# Patient Record
Sex: Male | Born: 1960 | Race: Black or African American | Hispanic: No
Health system: Southern US, Community
[De-identification: ages and names within clinical notes are randomized; demographics above are authoritative.]

## PROBLEM LIST (undated history)

## (undated) DIAGNOSIS — I639 Cerebral infarction, unspecified: Secondary | ICD-10-CM

## (undated) DIAGNOSIS — E785 Hyperlipidemia, unspecified: Secondary | ICD-10-CM

## (undated) DIAGNOSIS — E1149 Type 2 diabetes mellitus with other diabetic neurological complication: Secondary | ICD-10-CM

## (undated) DIAGNOSIS — I10 Essential (primary) hypertension: Secondary | ICD-10-CM

## (undated) HISTORY — PX: COLONOSCOPY: SHX174

---

## 2019-12-20 ENCOUNTER — Other Ambulatory Visit: Payer: Self-pay | Admitting: Critical Care Medicine

## 2019-12-20 DIAGNOSIS — Z20822 Contact with and (suspected) exposure to covid-19: Secondary | ICD-10-CM

## 2019-12-21 LAB — NOVEL CORONAVIRUS, NAA: SARS-CoV-2, NAA: NOT DETECTED

## 2020-01-10 ENCOUNTER — Other Ambulatory Visit: Payer: Self-pay

## 2020-01-10 DIAGNOSIS — Z20822 Contact with and (suspected) exposure to covid-19: Secondary | ICD-10-CM

## 2020-01-12 LAB — NOVEL CORONAVIRUS, NAA: SARS-CoV-2, NAA: NOT DETECTED

## 2020-01-31 ENCOUNTER — Other Ambulatory Visit: Payer: Self-pay

## 2020-01-31 DIAGNOSIS — Z20822 Contact with and (suspected) exposure to covid-19: Secondary | ICD-10-CM

## 2020-02-01 LAB — NOVEL CORONAVIRUS, NAA: SARS-CoV-2, NAA: NOT DETECTED

## 2020-03-12 ENCOUNTER — Ambulatory Visit: Payer: Medicaid Other | Attending: Internal Medicine

## 2020-03-12 DIAGNOSIS — Z23 Encounter for immunization: Secondary | ICD-10-CM

## 2020-03-12 NOTE — Progress Notes (Signed)
   Covid-[facility] Vaccination [facility]  Name:  Calvin Jackson    MRN: [id number] DOB: 02-15-1961  03/12/2020  [name] was observed post Covid-19 immunization for 15 minutes without incident. He was provided with Vaccine Information Sheet and instruction to access the V-Safe system.   [name] was instructed to call 911 with any severe reactions post vaccine: Marland Kitchen Difficulty breathing  . Swelling of face and throat  . A fast heartbeat  . A bad rash all over body  . Dizziness and weakness   Immunizations Administered    Name Date Dose VIS Date Route   Pfizer COVID-19 Vaccine 03/12/2020 10:33 AM 0.3 mL 11/15/2019 Intramuscular   Manufacturer: [employer], [employer]   Lot: [id number]   NDC: [address]-1

## 2020-04-03 ENCOUNTER — Ambulatory Visit: Payer: Medicaid Other | Attending: Internal Medicine

## 2020-04-03 DIAGNOSIS — Z23 Encounter for immunization: Secondary | ICD-10-CM

## 2020-04-03 NOTE — Progress Notes (Signed)
   Covid-19 Vaccination Clinic  Name:  Phillp Dolores    MRN: [id number] DOB: 09-03-1961  04/03/2020  Mr. Bogan was observed post Covid-19 immunization for 15 minutes without incident. He was provided with Vaccine Information Sheet and instruction to access the V-Safe system.   Mr. Doren was instructed to call 911 with any severe reactions post vaccine: Marland Kitchen Difficulty breathing  . Swelling of face and throat  . A fast heartbeat  . A bad rash all over body  . Dizziness and weakness   Immunizations Administered    Name Date Dose VIS Date Route   Pfizer COVID-19 Vaccine 04/03/2020  1:05 PM 0.3 mL 01/29/2019 Intramuscular   Manufacturer: [employer], [employer]   Lot: [id number]   NDC: [address]-8

## 2020-04-06 ENCOUNTER — Ambulatory Visit: Payer: Medicaid Other

## 2020-10-15 ENCOUNTER — Emergency Department (HOSPITAL_COMMUNITY): Payer: Self-pay

## 2020-10-15 ENCOUNTER — Inpatient Hospital Stay (HOSPITAL_COMMUNITY)
Admission: EM | Admit: 2020-10-15 | Discharge: 2020-10-17 | DRG: 065 | Disposition: A | Discharge status: Home / Self Care | Payer: Self-pay | Attending: Internal Medicine | Admitting: Internal Medicine

## 2020-10-15 ENCOUNTER — Encounter (HOSPITAL_COMMUNITY): Payer: Self-pay

## 2020-10-15 ENCOUNTER — Inpatient Hospital Stay (HOSPITAL_COMMUNITY): Payer: Self-pay

## 2020-10-15 DIAGNOSIS — F1220 Cannabis dependence, uncomplicated: Secondary | ICD-10-CM | POA: Diagnosis present

## 2020-10-15 DIAGNOSIS — Z23 Encounter for immunization: Secondary | ICD-10-CM

## 2020-10-15 DIAGNOSIS — I69354 Hemiplegia and hemiparesis following cerebral infarction affecting left non-dominant side: Secondary | ICD-10-CM

## 2020-10-15 DIAGNOSIS — F17200 Nicotine dependence, unspecified, uncomplicated: Secondary | ICD-10-CM | POA: Diagnosis present

## 2020-10-15 DIAGNOSIS — N289 Disorder of kidney and ureter, unspecified: Secondary | ICD-10-CM | POA: Diagnosis present

## 2020-10-15 DIAGNOSIS — G459 Transient cerebral ischemic attack, unspecified: Secondary | ICD-10-CM

## 2020-10-15 DIAGNOSIS — E785 Hyperlipidemia, unspecified: Secondary | ICD-10-CM | POA: Diagnosis present

## 2020-10-15 DIAGNOSIS — Z20822 Contact with and (suspected) exposure to covid-19: Secondary | ICD-10-CM | POA: Diagnosis present

## 2020-10-15 DIAGNOSIS — F1210 Cannabis abuse, uncomplicated: Secondary | ICD-10-CM | POA: Diagnosis present

## 2020-10-15 DIAGNOSIS — E876 Hypokalemia: Secondary | ICD-10-CM | POA: Diagnosis present

## 2020-10-15 DIAGNOSIS — I6389 Other cerebral infarction: Secondary | ICD-10-CM

## 2020-10-15 DIAGNOSIS — I639 Cerebral infarction, unspecified: Principal | ICD-10-CM | POA: Diagnosis present

## 2020-10-15 DIAGNOSIS — F17210 Nicotine dependence, cigarettes, uncomplicated: Secondary | ICD-10-CM | POA: Diagnosis present

## 2020-10-15 DIAGNOSIS — I10 Essential (primary) hypertension: Secondary | ICD-10-CM | POA: Diagnosis present

## 2020-10-15 DIAGNOSIS — R29700 NIHSS score 0: Secondary | ICD-10-CM | POA: Diagnosis present

## 2020-10-15 DIAGNOSIS — E1149 Type 2 diabetes mellitus with other diabetic neurological complication: Secondary | ICD-10-CM | POA: Diagnosis present

## 2020-10-15 HISTORY — DX: Essential (primary) hypertension: I10

## 2020-10-15 HISTORY — DX: Cannabis abuse, uncomplicated: F12.10

## 2020-10-15 HISTORY — DX: Cerebral infarction, unspecified: I63.9

## 2020-10-15 HISTORY — DX: Nicotine dependence, unspecified, uncomplicated: F17.200

## 2020-10-15 HISTORY — DX: Type 2 diabetes mellitus with other diabetic neurological complication: E11.49

## 2020-10-15 HISTORY — DX: Hyperlipidemia, unspecified: E78.5

## 2020-10-15 LAB — CBC
HCT: 42.6 % (ref 39.0–52.0)
Hemoglobin: 14.8 g/dL (ref 13.0–17.0)
MCH: 27.3 pg (ref 26.0–34.0)
MCHC: 34.7 g/dL (ref 30.0–36.0)
MCV: 78.6 fL — ABNORMAL LOW (ref 80.0–100.0)
Platelets: 210 10*3/uL (ref 150–400)
RBC: 5.42 MIL/uL (ref 4.22–5.81)
RDW: 13.7 % (ref 11.5–15.5)
WBC: 4.6 10*3/uL (ref 4.0–10.5)
nRBC: 0 % (ref 0.0–0.2)

## 2020-10-15 LAB — COMPREHENSIVE METABOLIC PANEL
ALT: 21 U/L (ref 0–44)
AST: 21 U/L (ref 15–41)
Albumin: 3.4 g/dL — ABNORMAL LOW (ref 3.5–5.0)
Alkaline Phosphatase: 54 U/L (ref 38–126)
Anion gap: 8 (ref 5–15)
BUN: 12 mg/dL (ref 8–23)
CO2: 26 mmol/L (ref 22–32)
Calcium: 8.6 mg/dL — ABNORMAL LOW (ref 8.9–10.3)
Chloride: 109 mmol/L (ref 98–111)
Creatinine, Ser: 1.43 mg/dL — ABNORMAL HIGH (ref 0.61–1.24)
GFR, Estimated: 56 mL/min — ABNORMAL LOW (ref >60)
Glucose, Bld: 121 mg/dL — ABNORMAL HIGH (ref 70–99)
Potassium: 3.3 mmol/L — ABNORMAL LOW (ref 3.5–5.1)
Sodium: 143 mmol/L (ref 135–145)
Total Bilirubin: 1 mg/dL (ref 0.3–1.2)
Total Protein: 6.6 g/dL (ref 6.5–8.1)

## 2020-10-15 LAB — HEMOGLOBIN A1C
Hgb A1c MFr Bld: 5.9 % — ABNORMAL HIGH (ref 4.8–5.6)
Mean Plasma Glucose: 122.63 mg/dL

## 2020-10-15 LAB — I-STAT CHEM 8, ED
BUN: 14 mg/dL (ref 8–23)
Calcium, Ion: 1.13 mmol/L — ABNORMAL LOW (ref 1.15–1.40)
Chloride: 105 mmol/L (ref 98–111)
Creatinine, Ser: 1.3 mg/dL — ABNORMAL HIGH (ref 0.61–1.24)
Glucose, Bld: 115 mg/dL — ABNORMAL HIGH (ref 70–99)
HCT: 46 % (ref 39.0–52.0)
Hemoglobin: 15.6 g/dL (ref 13.0–17.0)
Potassium: 3.2 mmol/L — ABNORMAL LOW (ref 3.5–5.1)
Sodium: 144 mmol/L (ref 135–145)
TCO2: 25 mmol/L (ref 22–32)

## 2020-10-15 LAB — ECHOCARDIOGRAM COMPLETE
Area-P 1/2: 3.42 cm2
Calc EF: 56.1 %
S' Lateral: 3.3 cm
Single Plane A2C EF: 58.8 %
Single Plane A4C EF: 53.8 %

## 2020-10-15 LAB — APTT: aPTT: 27 seconds (ref 24–36)

## 2020-10-15 LAB — DIFFERENTIAL
Abs Immature Granulocytes: 0.01 10*3/uL (ref 0.00–0.07)
Basophils Absolute: 0 10*3/uL (ref 0.0–0.1)
Basophils Relative: 1 %
Eosinophils Absolute: 0.3 10*3/uL (ref 0.0–0.5)
Eosinophils Relative: 7 %
Immature Granulocytes: 0 %
Lymphocytes Relative: 33 %
Lymphs Abs: 1.5 10*3/uL (ref 0.7–4.0)
Monocytes Absolute: 0.6 10*3/uL (ref 0.1–1.0)
Monocytes Relative: 13 %
Neutro Abs: 2.2 10*3/uL (ref 1.7–7.7)
Neutrophils Relative %: 46 %

## 2020-10-15 LAB — RAPID URINE DRUG SCREEN, HOSP PERFORMED
Amphetamines: NOT DETECTED
Barbiturates: NOT DETECTED
Benzodiazepines: NOT DETECTED
Cocaine: NOT DETECTED
Opiates: NOT DETECTED
Tetrahydrocannabinol: POSITIVE — AB

## 2020-10-15 LAB — GLUCOSE, CAPILLARY
Glucose-Capillary: 163 mg/dL — ABNORMAL HIGH (ref 70–99)
Glucose-Capillary: 119 mg/dL — ABNORMAL HIGH (ref 70–99)

## 2020-10-15 LAB — CBG MONITORING, ED
Glucose-Capillary: 112 mg/dL — ABNORMAL HIGH (ref 70–99)
Glucose-Capillary: 111 mg/dL — ABNORMAL HIGH (ref 70–99)
Glucose-Capillary: 109 mg/dL — ABNORMAL HIGH (ref 70–99)

## 2020-10-15 LAB — PROTIME-INR
INR: 1.1 (ref 0.8–1.2)
Prothrombin Time: 13.9 seconds (ref 11.4–15.2)

## 2020-10-15 LAB — RESPIRATORY PANEL BY RT PCR (FLU A&B, COVID)
Influenza A by PCR: NEGATIVE
Influenza B by PCR: NEGATIVE
SARS Coronavirus 2 by RT PCR: NEGATIVE

## 2020-10-15 LAB — HIV ANTIBODY (ROUTINE TESTING W REFLEX): HIV Screen 4th Generation wRfx: NONREACTIVE

## 2020-10-15 LAB — TSH: TSH: 0.992 u[IU]/mL (ref 0.350–4.500)

## 2020-10-15 IMAGING — MR MR HEAD W/O CM
12 of 13 series · 44 of 48 positions shown · non-contrast
Comparison: Noncontrast head CT and CT angiogram head/neck
[DATE].

CLINICAL DATA: Neuro deficit, acute, stroke suspected.

EXAM:
MRI HEAD WITHOUT CONTRAST
TECHNIQUE: Multiplanar, multiecho pulse sequences of the brain and surrounding
structures were obtained without intravenous contrast.

[Series 5: DWI · axial · 3.0mm · 0.88mm/px · z∈[-83,+64]mm · 7 of 100 slices shown (1 of 4)]
[im 1/100]
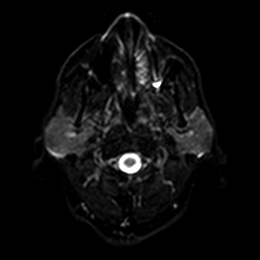
[im 17/100]
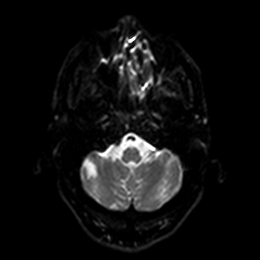
[im 34/100]
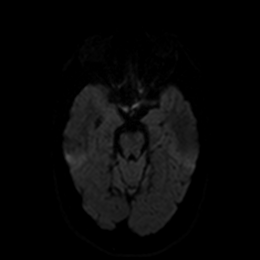
[im 50/100]
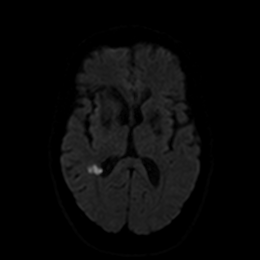
[im 67/100]
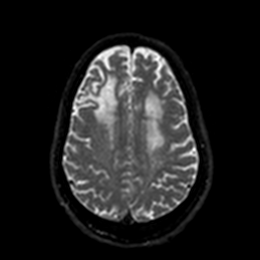
[im 83/100]
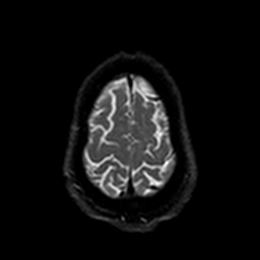
[im 100/100]
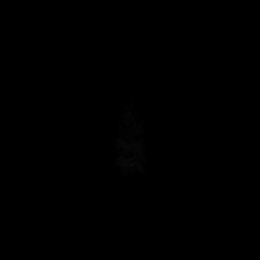

[Series 6: DWI · axial · 3.0mm · 0.88mm/px · z∈[-83,+64]mm · 4 of 50 slices shown (2 of 4)]
[im 1/50]
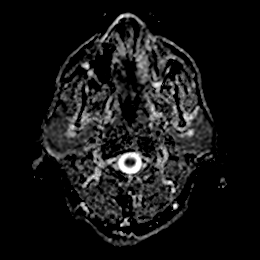
[im 17/50]
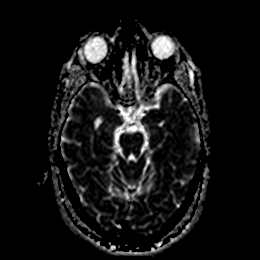
[im 33/50]
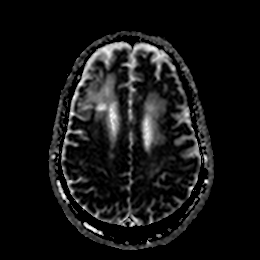
[im 50/50]
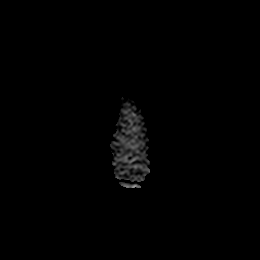

[Series 7: DWI · coronal · 4.0mm · 0.88mm/px · 6 of 78 slices shown (3 of 4)]
[im 1/78]
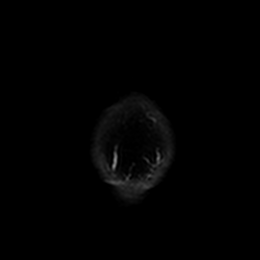
[im 16/78]
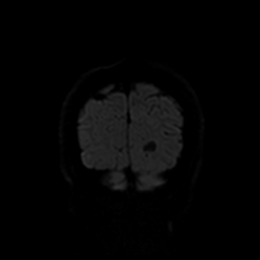
[im 31/78]
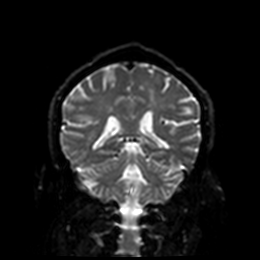
[im 47/78]
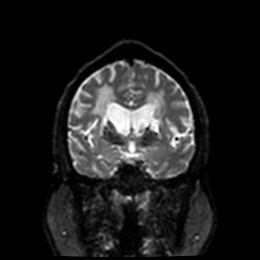
[im 62/78]
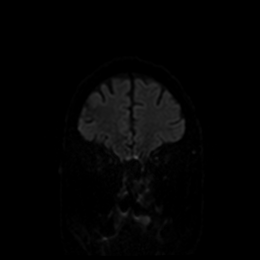
[im 78/78]
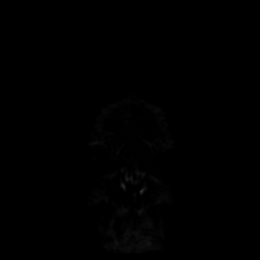

[Series 8: DWI · coronal · 4.0mm · 0.88mm/px · 3 of 39 slices shown (4 of 4)]
[im 1/39]
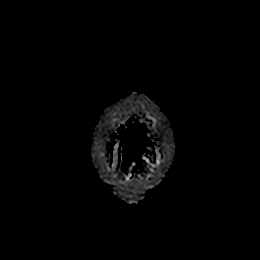
[im 20/39]
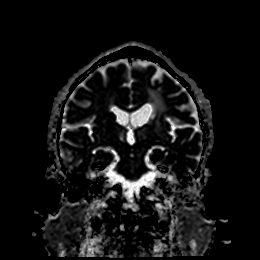
[im 39/39]
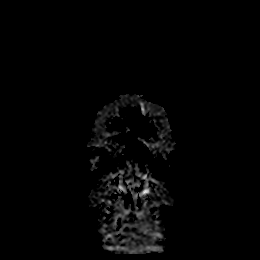

[Series 9: T1 · sagittal · 5.0mm · 0.75mm/px · 2 of 23 slices shown]
[im 1/23]
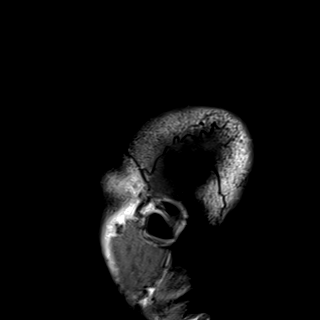
[im 23/23]
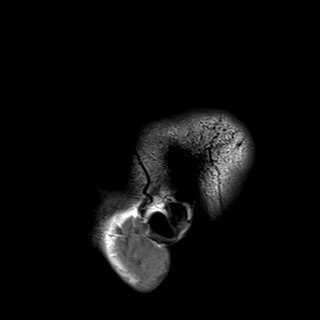

[Series 10: T2 · axial · 5.0mm · 0.72mm/px · z∈[-81,+62]mm · 2 of 25 slices shown (1 of 2)]
[im 1/25]
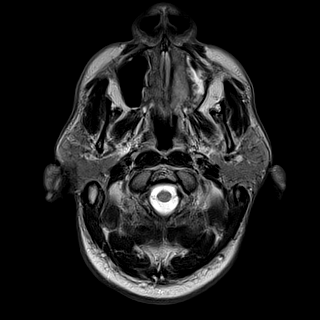
[im 25/25]
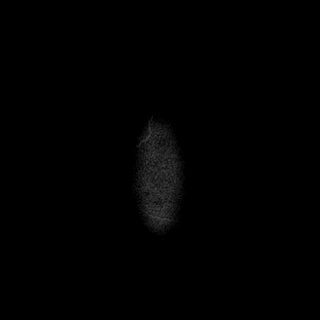

[Series 11: FLAIR · axial · 5.0mm · 0.45mm/px · z∈[-79,+64]mm · 2 of 25 slices shown]
[im 1/25]
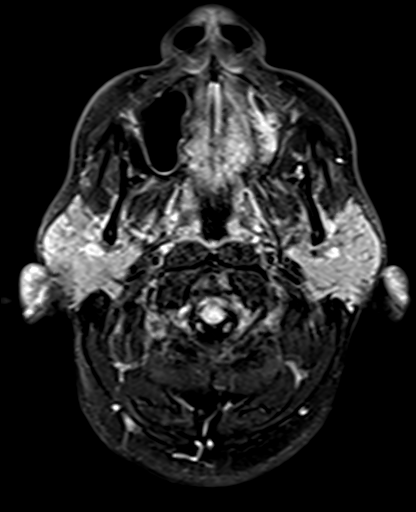
[im 25/25]
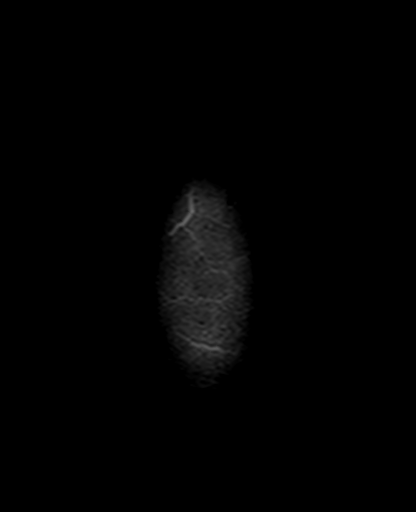

[Series 12: mag_images · axial · 3.0mm · 0.90mm/px · z∈[-96,+80]mm · 4 of 60 slices shown]
[im 1/60]
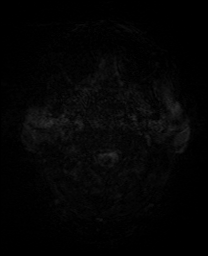
[im 20/60]
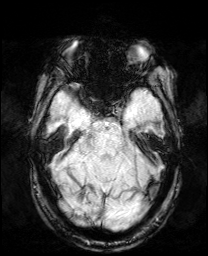
[im 40/60]
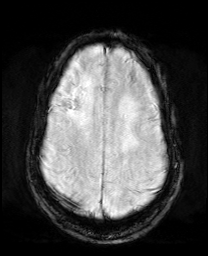
[im 60/60]
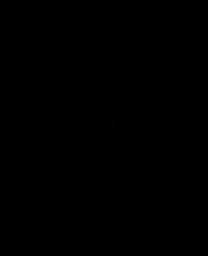

[Series 13: pha_images · axial · 3.0mm · 0.90mm/px · z∈[-96,+77]mm · 4 of 57 slices shown]
[im 1/57]
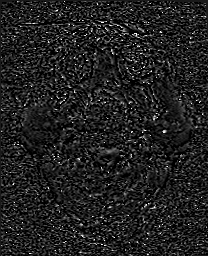
[im 19/57]
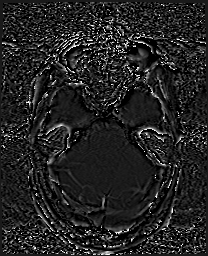
[im 38/57]
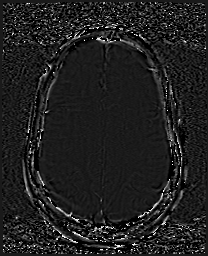
[im 57/57]
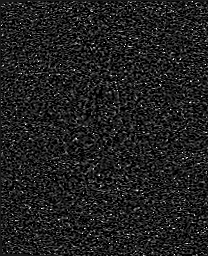

[Series 14: swi_images · axial · 3.0mm · 0.90mm/px · z∈[-96,+80]mm · 4 of 60 slices shown]
[im 1/60]
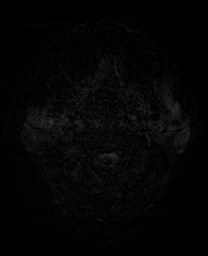
[im 20/60]
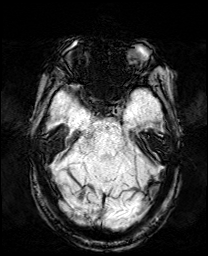
[im 40/60]
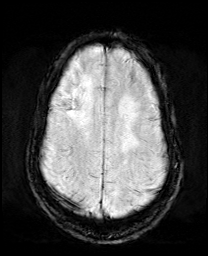
[im 60/60]
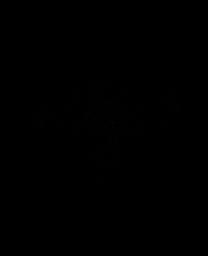

[Series 15: mip_images(sw) · axial · 24.0mm · 0.90mm/px · z∈[-85,+70]mm · 4 of 53 slices shown]
[im 1/53]
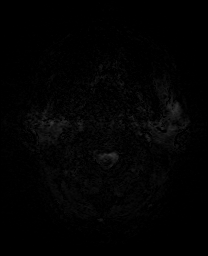
[im 18/53]
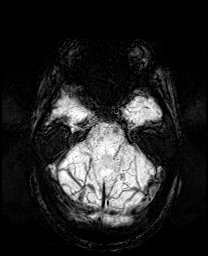
[im 35/53]
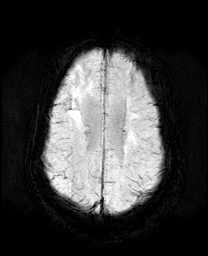
[im 53/53]
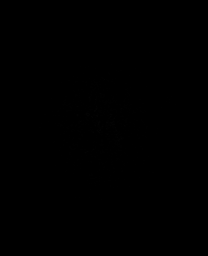

[Series 17: T2 · coronal · 5.0mm · 0.34mm/px · 2 of 32 slices shown (2 of 2)]
[im 1/32]
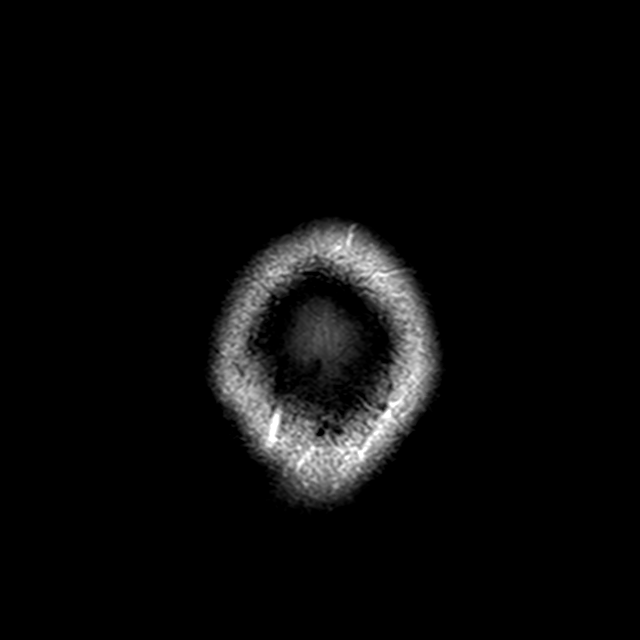
[im 32/32]
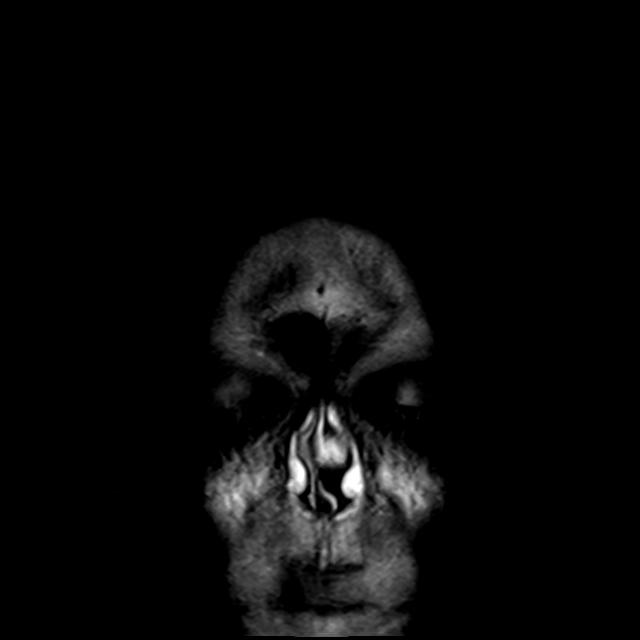

[44 of 48 positions shown; findings below may reference images not displayed]

FINDINGS: Brain:

Intermittent motion degradation. Most notably, there is moderate
motion degradation of the coronal T2 weighted sequence.

0.7 x 1.3 x 0.5 cm focus of restricted diffusion within the right
temporoparietal periventricular white matter consistent with acute
infarction (series 5, image 76).

2.1 x 0.8 x 0.9 cm focus of restricted diffusion within the right
corona radiata, possibly also involving portions of the adjacent
caudate nucleus, compatible with acute infarction (series 5, image
80).

A few small subtle acute cortical infarcts are also suspected within
the right parietal lobe (for instance as seen on series 5, image 79)
(series 7, image 45).

Chronic cortical/subcortical infarct within the mid to anterior and
lateral right frontal lobe/right frontal operculum. This infarct
extends to the right insula anteriorly

Chronic subcortical infarcts are also present within the bilateral
cerebral white matter, basal ganglia and thalami. Small chronic
infarcts are present within the bilateral cerebellar hemispheres.

Severe scattered and confluent T2/FLAIR hyperintensity within the
cerebral white matter is nonspecific, but compatible chronic small
vessel ischemic disease. To a lesser degree, chronic small vessel
ischemic changes are also present within the pons.

Mild SWI signal loss within the bilateral basal ganglia which may
reflect chronic blood products or mineralization.

No evidence of intracranial mass.

No extra-axial fluid collection.

No midline shift.

Vascular: Expected proximal arterial flow voids.

Skull and upper cervical spine: No focal marrow lesion. Cervical
spondylosis.

Sinuses/Orbits: Visualized orbits show no acute finding. Mild
ethmoid and left maxillary sinus mucosal thickening. Small right
maxillary sinus mucous retention cyst. Polypoid soft tissue within
the anterior left nasal passage.
IMPRESSION: Motion degraded examination.

1.3 cm acute infarct within the right temporoparietal
periventricular white matter.

2.1 cm acute infarct within the right corona radiata, possibly also
involving portions of the adjacent right caudate nucleus.

A few small subtle acute cortical infarcts are also suspected within
the right parietal lobe.

Severe chronic small vessel ischemic disease with remote
supratentorial cortical and subcortical infarcts, and remote
bilateral cerebellar infarcts.

Paranasal sinus disease as described.

## 2020-10-15 IMAGING — CT CT ANGIO HEAD-NECK
3 of 7 series · 10 of 36 positions shown · IV contrast (OMNI 350)
Comparison: Head CT from earlier the same day

CLINICAL DATA: Left leg drift.  History of stroke



[Series 7: cta neck · axial · 0.48mm/px · z∈[-242,-128]mm · 2 of 173 slices shown]
[im 58/173  soft-tissue]
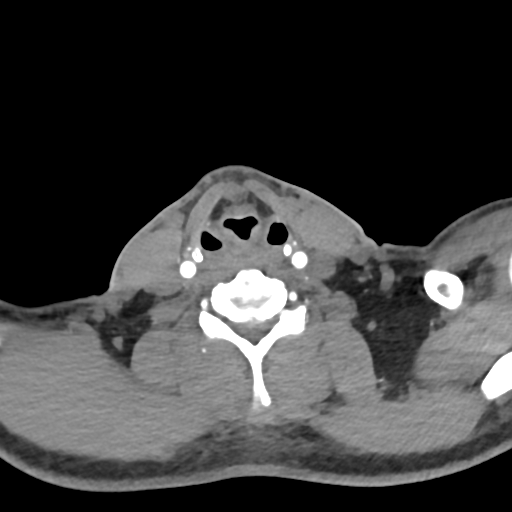
[im 115/173  bone]
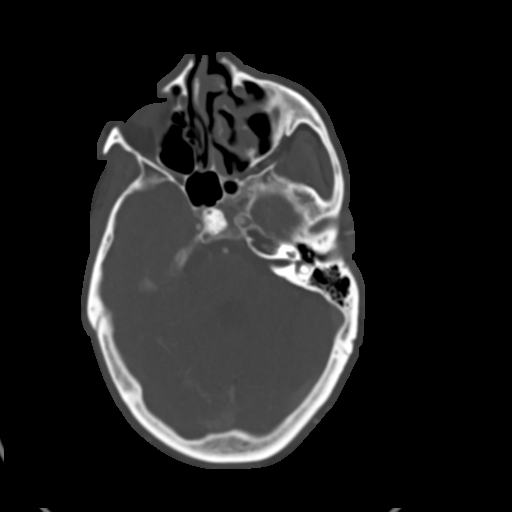

[Series 9: cta neck axial · axial · 0.39mm/px · z∈[-317,-73]mm · 6 of 343 slices shown]
[im 49/343  soft-tissue]
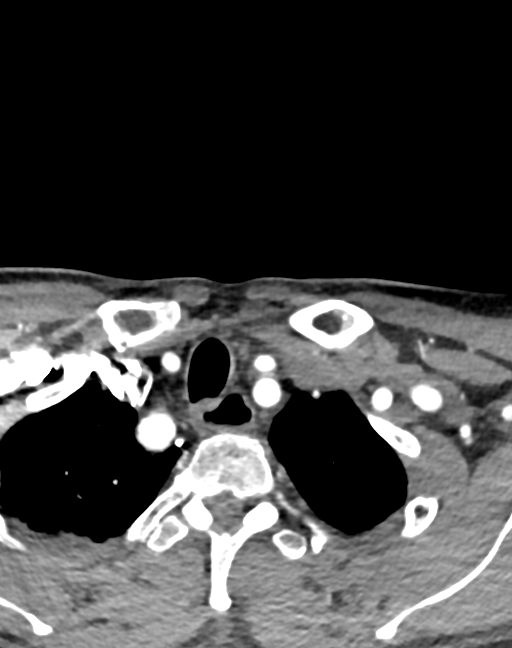
[im 98/343  soft-tissue]
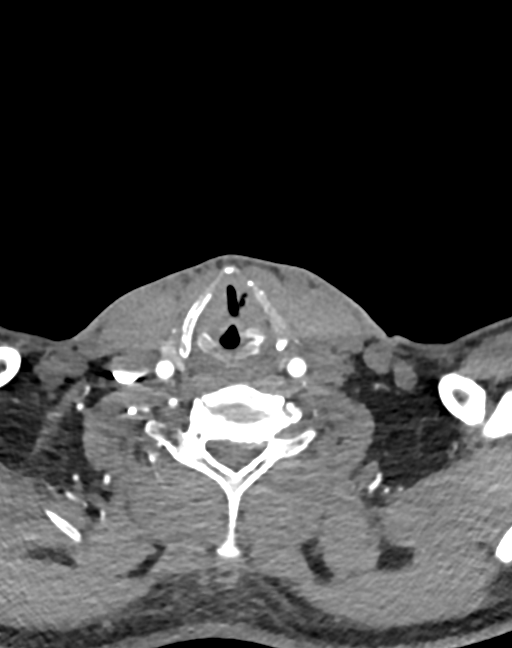
[im 147/343  soft-tissue]
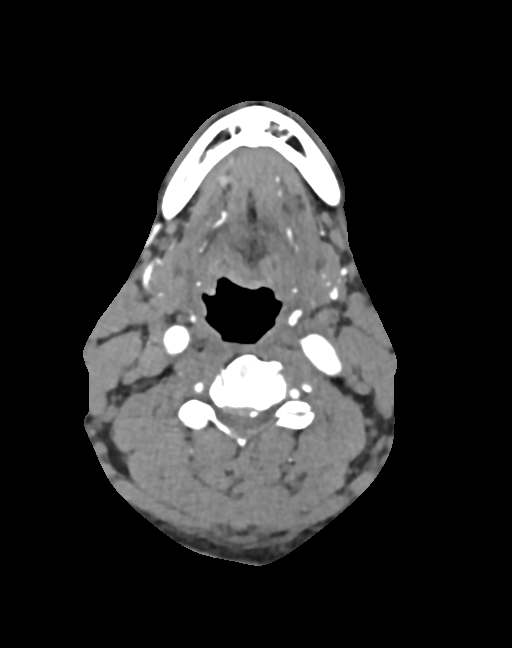
[im 196/343  soft-tissue]
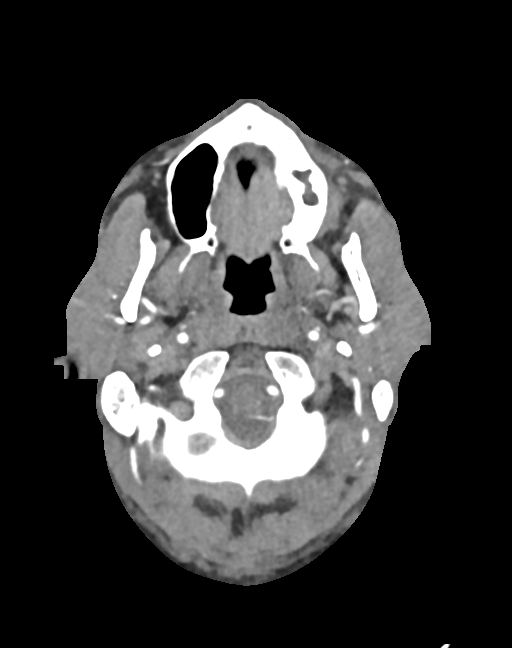
[im 245/343  soft-tissue]
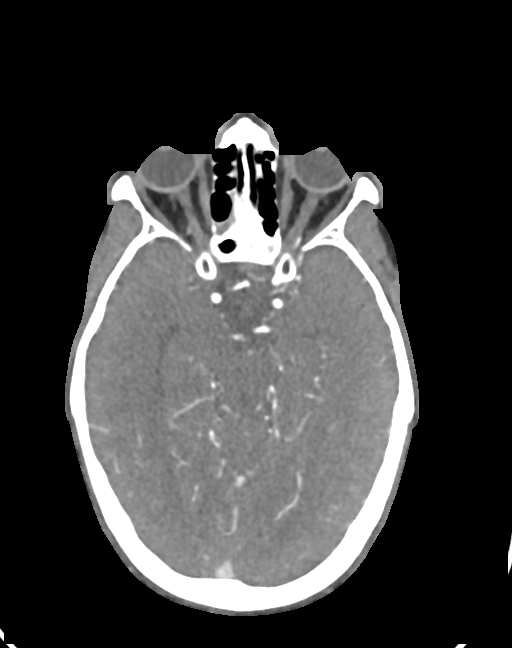
[im 294/343  soft-tissue]
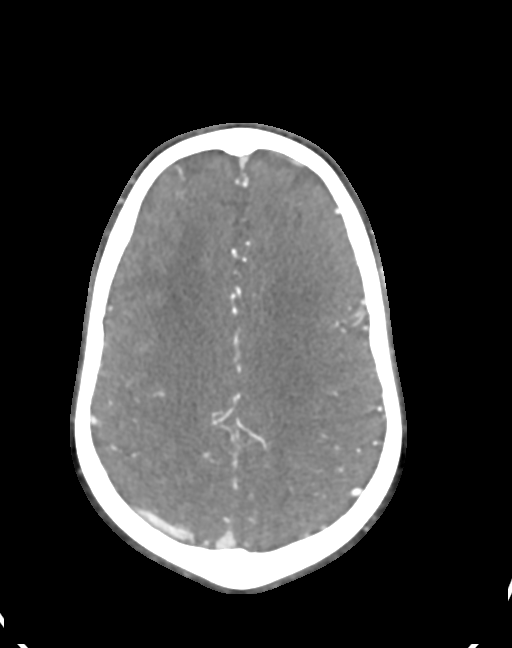

[Series 11: cta neck sagittal · sagittal · 0.49mm/px · 2 of 175 slices shown]
[im 33/175  soft-tissue]
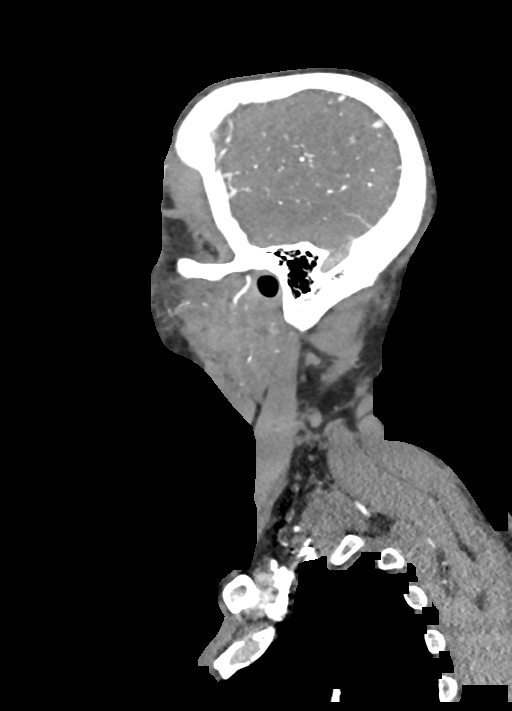
[im 143/175  soft-tissue]
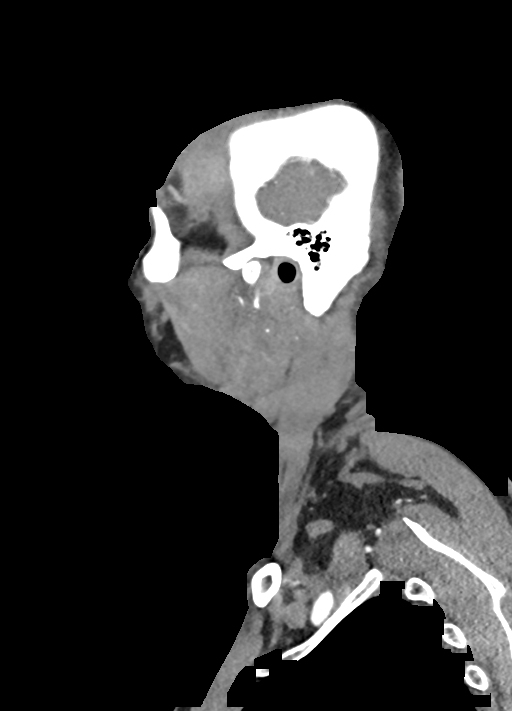

[10 of 36 positions shown; findings below may reference images not displayed]

FINDINGS: CTA NECK FINDINGS

Aortic arch: 3 vessel branching.  No acute finding or dilatation.

Right carotid system: Vessels are smooth and widely patent with no
noted atheromatous changes.

Left carotid system: Vessels are smooth and widely patent with mild
atheromatous changes at the bifurcation.

Vertebral arteries: No proximal subclavian stenosis. The vertebral
arteries are smooth and widely patent.

Skeleton: Few remaining teeth with osteitis around the carious right
lower molar. Isolated sclerotic focus in the clivus which has an
overall benign appearance. There is multilevel degenerative disc
narrowing and ridging.

Other neck: Negative

Upper chest: Airway thickening at the apices.

Review of the MIP images confirms the above findings

CTA HEAD FINDINGS

Anterior circulation: Mild atheromatous narrowing and calcification
of bilateral carotid siphons. Branch vessel atheromatous type
irregularity, most notably affecting the right M1 segment where
there is moderate stenosis that is most prominent on coronal
reformats. 1 mm outpouchings of the bilateral ophthalmic ICA.

Posterior circulation: The vertebral and basilar arteries are smooth
and widely patent. Moderate right P2 segment narrowing. Atheromatous
irregularity of bilateral PCA branches. Negative for aneurysm

Venous sinuses: Patent

Anatomic variants: None unusual

Review of the MIP images confirms the above findings
IMPRESSION: 1. No emergent finding.
2. Primarily intracranial atherosclerosis most notably causing right
M1 and right P2 moderate narrowings.
3. 1 mm outpouchings at the bilateral ophthalmic ICA which could be
infundibulum or early aneurysm.

## 2020-10-15 IMAGING — CT CT HEAD CODE STROKE
3 series · 15 of 47 positions shown, 18 images · non-contrast
Comparison: None.

CLINICAL DATA: Code stroke.  Left leg drift

EXAM:
CT HEAD WITHOUT CONTRAST
TECHNIQUE: Contiguous axial images were obtained from the base of the skull
through the vertex without intravenous contrast.

[Series 3: head 5.0 st · axial · 0.45mm/px · z∈[-152,-18]mm · 9 of 33 slices shown, 12 images]
[im 3/33  brain]
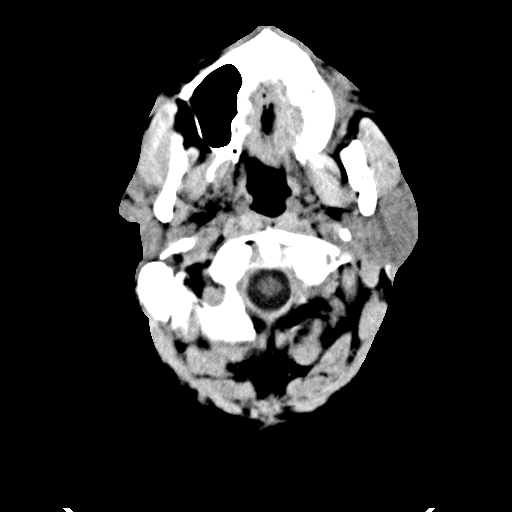
[im 3/33  bone]
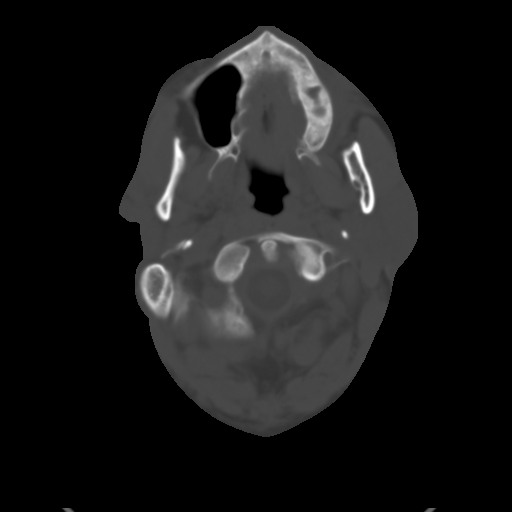
[im 6/33  brain]
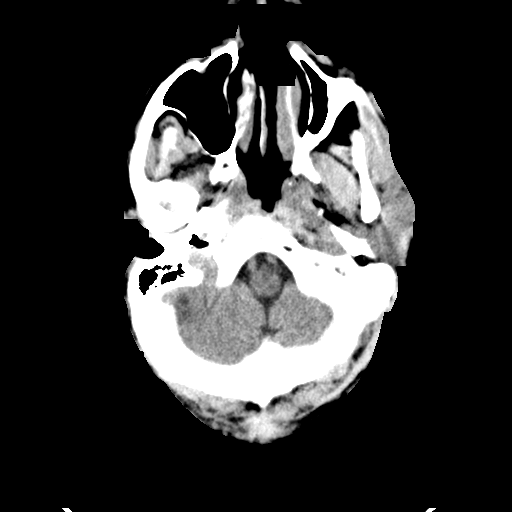
[im 9/33  brain]
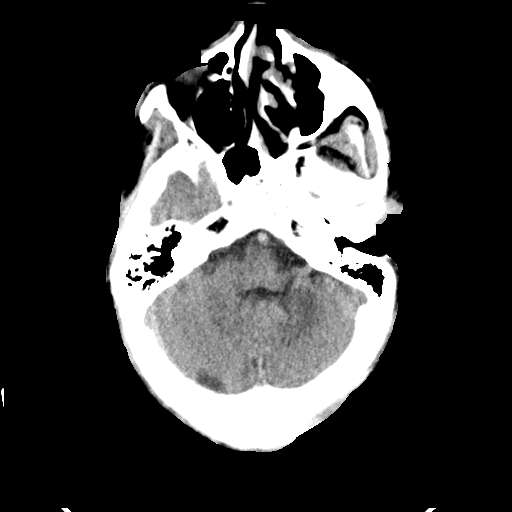
[im 13/33  brain]
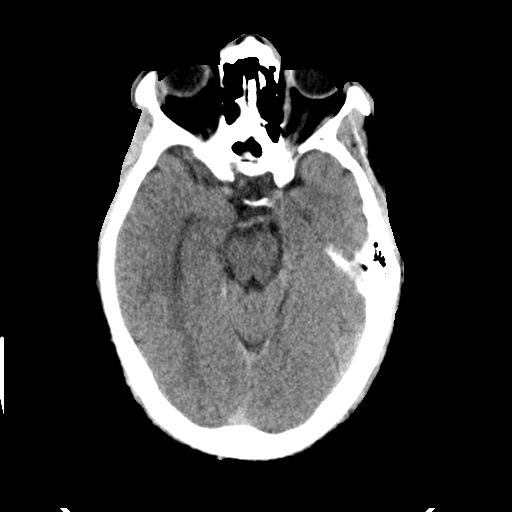
[im 17/33  brain]
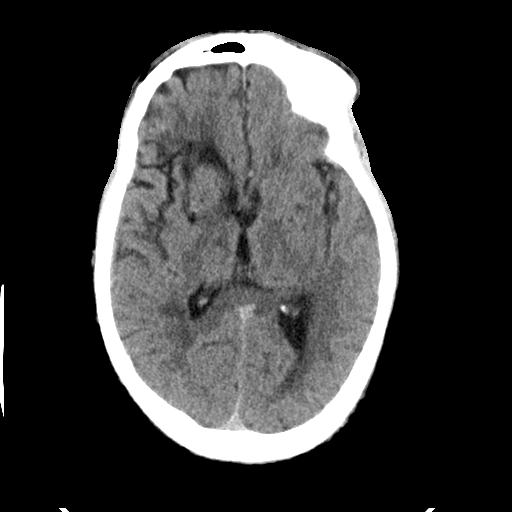
[im 17/33  bone]
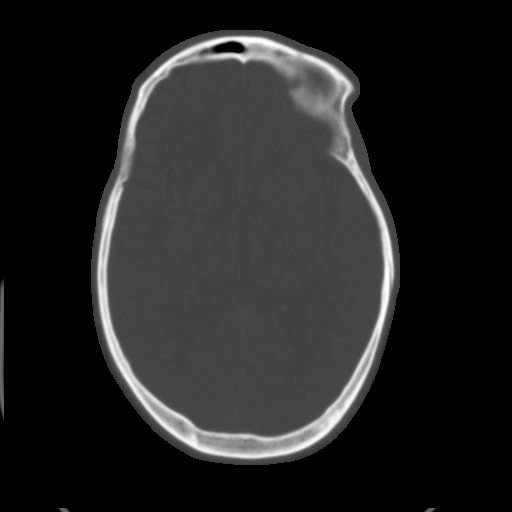
[im 20/33  brain]
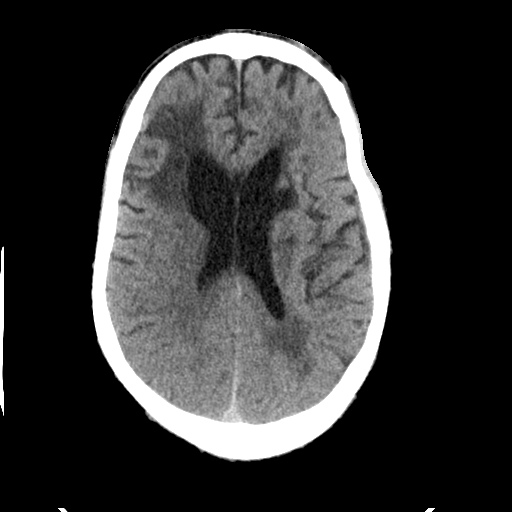
[im 24/33  brain]
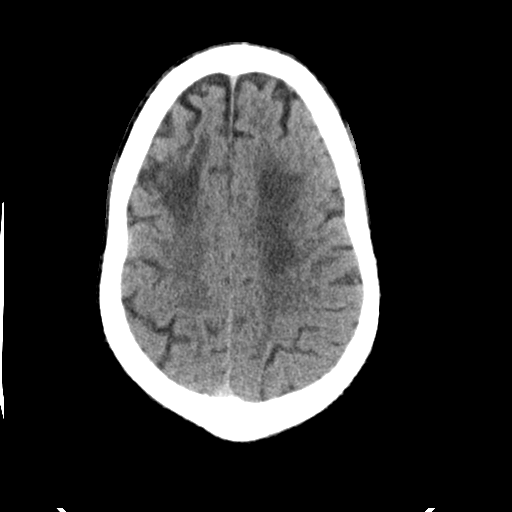
[im 27/33  brain]
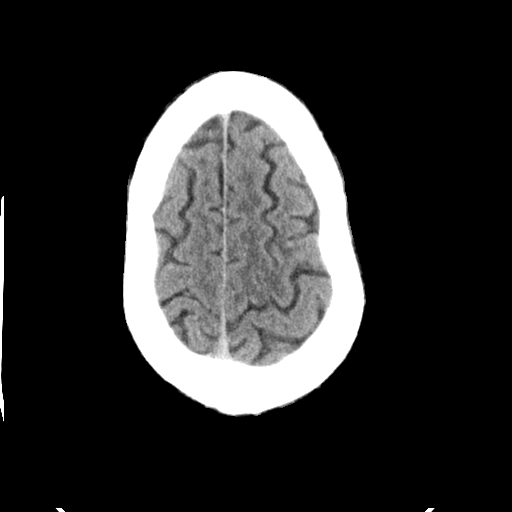
[im 30/33  brain]
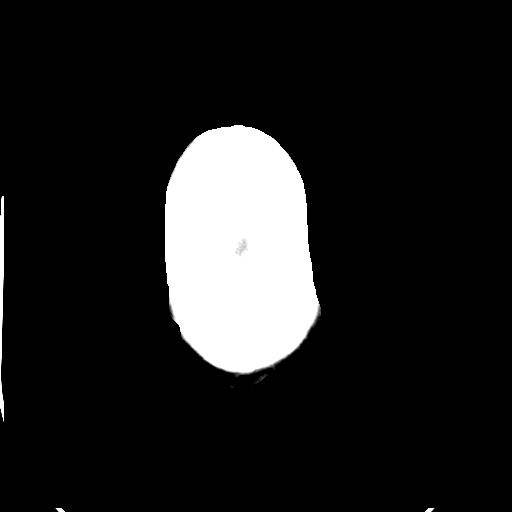
[im 30/33  bone]
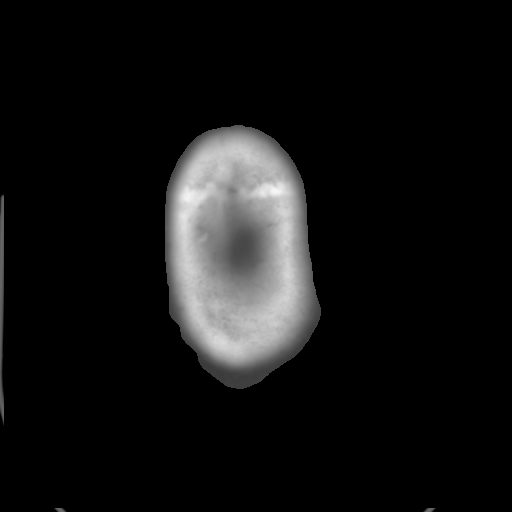

[Series 5: head 3.0 cor st · coronal · 0.32mm/px · 3 of 76 slices shown]
[im 26/76  brain]
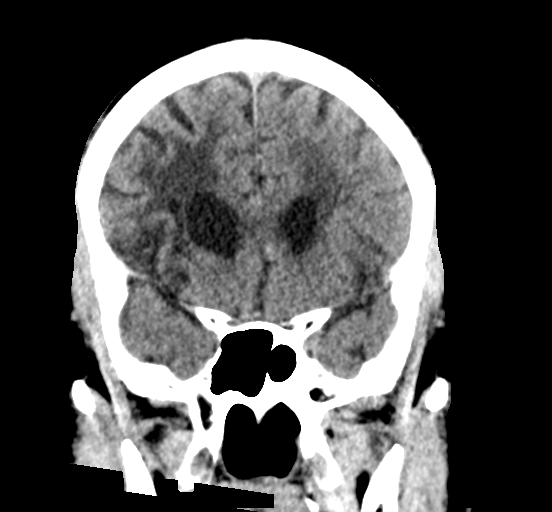
[im 34/76  brain]
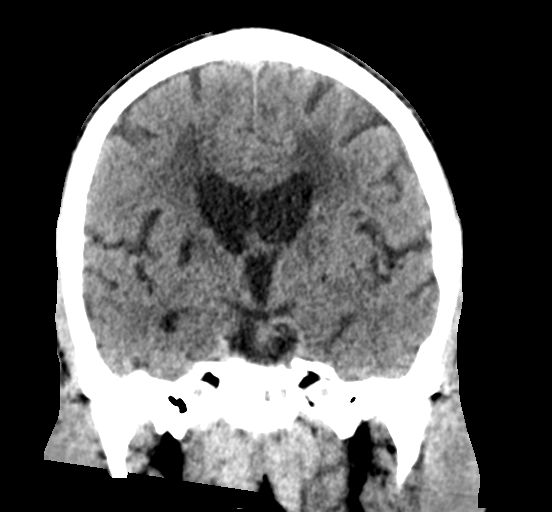
[im 42/76  brain]
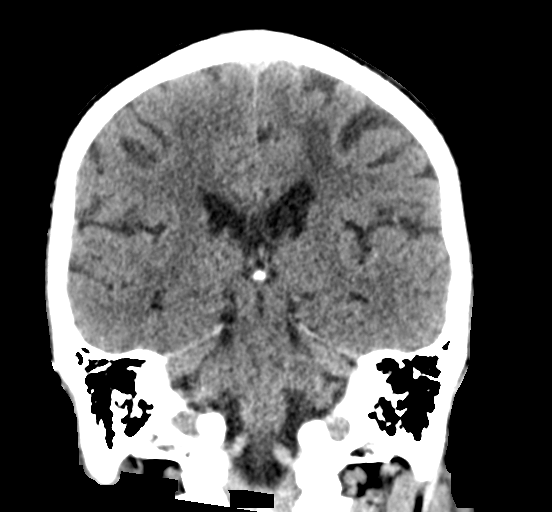

[Series 6: head 3.0 sag st · sagittal · 0.33mm/px · 3 of 56 slices shown]
[im 21/56  brain]
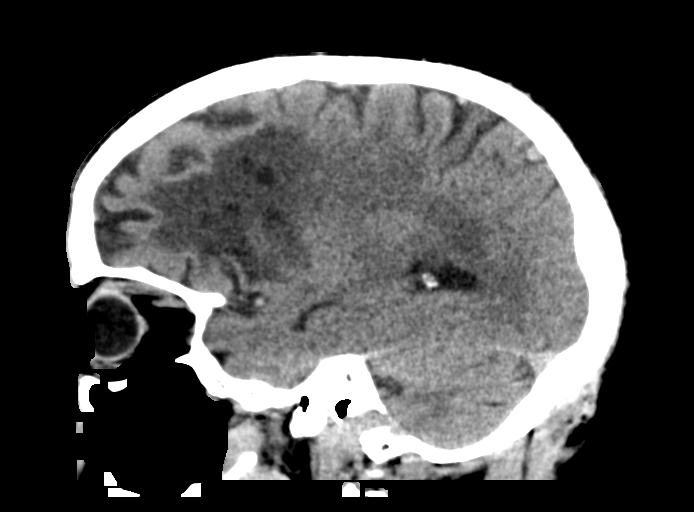
[im 28/56  brain]
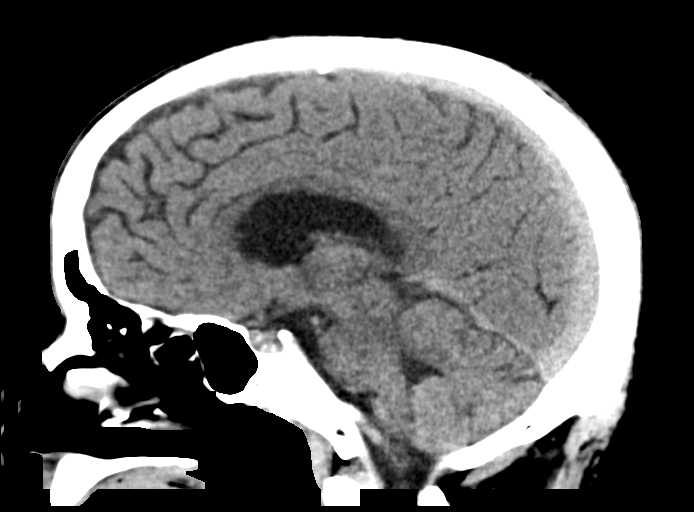
[im 35/56  brain]
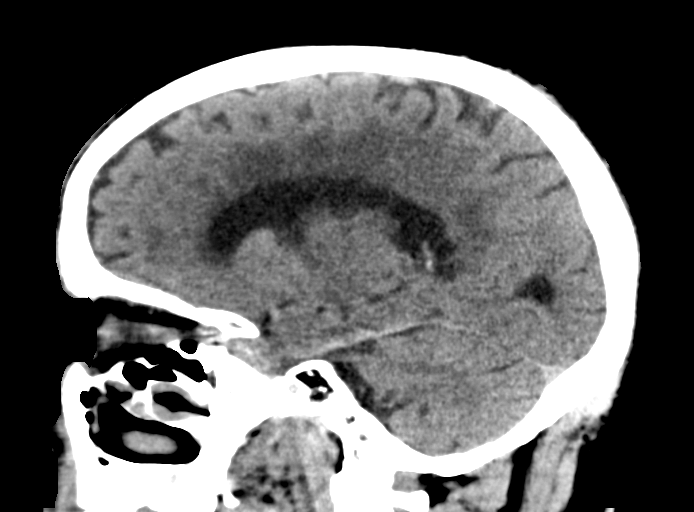

[15 of 47 positions shown; findings below may reference images not displayed]

FINDINGS: Brain: No acute cortical infarct, hemorrhage, hydrocephalus, or
masslike finding. Numerous remote subcortical infarcts, lacunar and
perforator, traversing the basal ganglia and deep white matter
tracks. Chronic small vessel ischemia in the cerebral white matter
that is confluent. There has been prior patchy cortical infarction
in the anterior division right MCA territory. Small remote right
cerebellar infarcts.

Vascular: Mid basilar appears high-density on axial slices, likely
from streak artifact based on reformats.

Skull: Nonspecific sclerosis in the clivus, the overall benign
appearing margins.

Sinuses/Orbits: Mucosal thickening in the paranasal sinuses and
nasal cavity.

Other: These results were called by telephone at the time of
interpretation on [DATE] at [DATE] to provider Dr KISHOR
who verbally acknowledged these results.

ASPECTS ([place] Stroke Program Early CT Score)

Not scored given the extent of chronic changes.
IMPRESSION: 1. Advanced chronic small vessel disease with numerous remote
cortical and subcortical infarcts.
2. Intermittently high-density basilar which is likely from streak
artifact. CTA is pending.

## 2020-10-15 MED ORDER — ACETAMINOPHEN 650 MG RE SUPP: 650.0000 mg | RECTAL | Status: DC | PRN

## 2020-10-15 MED ORDER — SENNOSIDES-DOCUSATE SODIUM 8.6-50 MG PO TABS: 1.0000 | ORAL_TABLET | Freq: Every evening | ORAL | Status: DC | PRN

## 2020-10-15 MED ORDER — TAMSULOSIN HCL 0.4 MG PO CAPS
0.4000 mg | ORAL_CAPSULE | Freq: Every day | ORAL | Status: DC
  Administered 2020-10-15 – 2020-10-17 (×3): 0.4 mg via ORAL
  Filled 2020-10-15 (×3): qty 1

## 2020-10-15 MED ORDER — ACETAMINOPHEN 325 MG PO TABS: 650.0000 mg | ORAL_TABLET | ORAL | Status: DC | PRN

## 2020-10-15 MED ORDER — IOHEXOL 350 MG/ML SOLN
80.0000 mL | Freq: Once | INTRAVENOUS | Status: AC | PRN
  Administered 2020-10-15: 80 mL via INTRAVENOUS

## 2020-10-15 MED ORDER — INSULIN ASPART 100 UNIT/ML ~~LOC~~ SOLN
0.0000 [IU] | Freq: Three times a day (TID) | SUBCUTANEOUS | Status: DC
  Administered 2020-10-15: 3 [IU] via SUBCUTANEOUS
  Administered 2020-10-17: 2 [IU] via SUBCUTANEOUS

## 2020-10-15 MED ORDER — POTASSIUM CHLORIDE CRYS ER 20 MEQ PO TBCR
40.0000 meq | EXTENDED_RELEASE_TABLET | Freq: Once | ORAL | Status: AC
  Administered 2020-10-15: 40 meq via ORAL

## 2020-10-15 MED ORDER — ATORVASTATIN CALCIUM 40 MG PO TABS
40.0000 mg | ORAL_TABLET | Freq: Every day | ORAL | Status: DC
  Administered 2020-10-15 – 2020-10-17 (×3): 40 mg via ORAL
  Filled 2020-10-15 (×3): qty 1

## 2020-10-15 MED ORDER — ONDANSETRON HCL 4 MG/2ML IJ SOLN: 4.0000 mg | Freq: Four times a day (QID) | INTRAMUSCULAR | Status: DC | PRN

## 2020-10-15 MED ORDER — CLOPIDOGREL BISULFATE 75 MG PO TABS
75.0000 mg | ORAL_TABLET | Freq: Every day | ORAL | Status: DC
  Administered 2020-10-15 – 2020-10-17 (×3): 75 mg via ORAL
  Filled 2020-10-15 (×3): qty 1

## 2020-10-15 MED ORDER — INSULIN ASPART 100 UNIT/ML ~~LOC~~ SOLN: 0.0000 [IU] | Freq: Every day | SUBCUTANEOUS | Status: DC

## 2020-10-15 MED ORDER — FAMOTIDINE 10 MG PO TABS
10.0000 mg | ORAL_TABLET | Freq: Two times a day (BID) | ORAL | Status: DC
  Administered 2020-10-15 – 2020-10-17 (×6): 10 mg via ORAL
  Filled 2020-10-15 (×6): qty 1

## 2020-10-15 MED ORDER — ACETAMINOPHEN 160 MG/5ML PO SOLN: 650.0000 mg | ORAL | Status: DC | PRN

## 2020-10-15 MED ORDER — SODIUM CHLORIDE 0.9 % IV SOLN: INTRAVENOUS | Status: DC

## 2020-10-15 MED ORDER — ENOXAPARIN SODIUM 40 MG/0.4ML ~~LOC~~ SOLN
40.0000 mg | SUBCUTANEOUS | Status: DC
  Administered 2020-10-15 – 2020-10-17 (×3): 40 mg via SUBCUTANEOUS
  Filled 2020-10-15 (×3): qty 0.4

## 2020-10-15 MED ORDER — ASPIRIN 81 MG PO CHEW
81.0000 mg | CHEWABLE_TABLET | Freq: Every day | ORAL | Status: DC
  Administered 2020-10-15 – 2020-10-17 (×3): 81 mg via ORAL
  Filled 2020-10-15 (×3): qty 1

## 2020-10-15 MED ORDER — SODIUM CHLORIDE 0.9% FLUSH
3.0000 mL | Freq: Once | INTRAVENOUS | Status: AC
  Administered 2020-10-15: 3 mL via INTRAVENOUS

## 2020-10-15 MED ORDER — STROKE: EARLY STAGES OF RECOVERY BOOK
Freq: Once | Status: AC
  Administered 2020-10-15: 1
  Filled 2020-10-15: qty 1

## 2020-10-15 NOTE — H&P (Signed)
History and Physical    Calvin Jackson QQV:[id number] DOB: [birth year] DOA: 10/15/2020  PCP: Luberta Robertson an NP at [facility]) - an organization that supports those who are homeless or at risk for homelessness Consultants:  None Patient coming from:  Home - lives with sister; NOK: Asa Saunas, [phone]  Chief Complaint: L-sided weakness  HPI: Calvin Jackson is a 59 y.o. male with medical history significant of DM; HTN; HLD; and prior CVA (deficit with dragging left leg) presenting with L-sided weakness.  He reports that he was cleaning potatoes last night when his his knees buckled.  He noticed increasing weakness of his left arm and leg.  He denies issues with speech/swallow.  He reports having had a CVA about 2-3 years ago, and was cared for at [facility] in [place] (records not available in [facility] but have been requested).     ED Course:  Carryover, per Dr. Toniann Fail:  59 year old male with history of stroke with left-sided weakness presents with worsening weakness of the left side concerning for another stroke. Neurology has seen the patient MRI brain is pending. Need for stroke work-up.  Review of Systems: As per HPI; otherwise review of systems reviewed and negative.    COVID Vaccine Status:   Complete  Past Medical History:  Diagnosis Date  . Diabetes mellitus with neurological manifestation (HCC)   . Dyslipidemia   . Essential hypertension   . Marijuana abuse 10/15/2020  . Stroke (HCC)   . Tobacco dependence 10/15/2020    History reviewed. No pertinent surgical history.  Social History   Socioeconomic History  . Marital status: Divorced    Spouse name: Not on file  . Number of children: Not on file  . Years of education: Not on file  . Highest education level: Not on file  Occupational History  . Occupation: works part-time at [employer]  . Smoking status: Current Every Day Smoker    Packs/day: 0.50      Years: 47.00    Pack years: 23.50  . Smokeless tobacco: Never Used  . Tobacco comment: declines patch - reports vivid dreams  Substance and Sexual Activity  . Alcohol use: Not Currently  . Drug use: Yes    Types: Marijuana    Comment: occasional use  . Sexual activity: Not on file  Other Topics Concern  . Not on file  Social History Narrative  . Not on file   Social Determinants of Health   Financial Resource Strain:   . Difficulty of Paying Living Expenses: Not on file  Food Insecurity:   . Worried About Programme researcher, broadcasting/film/video in the Last Year: Not on file  . Ran Out of Food in the Last Year: Not on file  Transportation Needs:   . Lack of Transportation (Medical): Not on file  . Lack of Transportation (Non-Medical): Not on file  Physical Activity:   . Days of Exercise per Week: Not on file  . Minutes of Exercise per Session: Not on file  Stress:   . Feeling of Stress : Not on file  Social Connections:   . Frequency of Communication with Friends and Family: Not on file  . Frequency of Social Gatherings with Friends and Family: Not on file  . Attends Religious Services: Not on file  . Active Member of Clubs or Organizations: Not on file  . Attends Banker Meetings: Not on file  . Marital Status: Not on file  Intimate Partner  Violence:   . Fear of Current or Ex-Partner: Not on file  . Emotionally Abused: Not on file  . Physically Abused: Not on file  . Sexually Abused: Not on file    No Known Allergies  History reviewed. No pertinent family history.  Prior to Admission medications   Not on File    Physical Exam: Vitals:   10/15/20 0815 10/15/20 0830 10/15/20 0915 10/15/20 0926  BP: (!) 185/100 (!) 192/90 (!) 174/86 (!) 174/86  Pulse: (!) 50 (!) 48 71 (!) 57  Resp: 10 10  11   Temp:      TempSrc:      SpO2: 98% 99% 100% 98%     . General:  Appears calm and comfortable and is NAD . Eyes:   EOMI, normal lids but they are halfway closed  throughout evaluation, iris . ENT:  grossly normal hearing, lips & tongue, mmm; poor dentition . Neck:  no LAD, masses or thyromegaly . Cardiovascular:  RRR, no m/r/g. No LE edema.  Respiratory:   CTA bilaterally with no wheezes/rales/rhonchi.  Normal respiratory effort. . Abdomen:  soft, NT, ND, NABS . Skin:  no rash or induration seen on limited exam . Musculoskeletal:  LLE > LUE weakness, 4/5 on lower and 4-5/5 on upper - unclear whether acute or chronic . Psychiatric:  Mildly somnolent mood and affect, speech fluent and appropriate, AOx3 . Neurologic:  CN 2-12 grossly intact, moves all extremities in coordinated fashion    Radiological Exams on Admission: Independently reviewed - see discussion in A/P where applicable  MR BRAIN WO CONTRAST  Result Date: 10/15/2020 CLINICAL DATA:  Neuro deficit, acute, stroke suspected. EXAM: MRI HEAD WITHOUT CONTRAST TECHNIQUE: Multiplanar, multiecho pulse sequences of the brain and surrounding structures were obtained without intravenous contrast. COMPARISON:  Noncontrast head CT and CT angiogram head/neck 10/15/2020. FINDINGS: Brain: Intermittent motion degradation. Most notably, there is moderate motion degradation of the coronal T2 weighted sequence. 0.7 x 1.3 x 0.5 cm focus of restricted diffusion within the right temporoparietal periventricular white matter consistent with acute infarction (series 5, image 76). 2.1 x 0.8 x 0.9 cm focus of restricted diffusion within the right corona radiata, possibly also involving portions of the adjacent caudate nucleus, compatible with acute infarction (series 5, image 80). A few small subtle acute cortical infarcts are also suspected within the right parietal lobe (for instance as seen on series 5, image 79) (series 7, image 45). Chronic cortical/subcortical infarct within the mid to anterior and lateral right frontal lobe/right frontal operculum. This infarct extends to the right insula anteriorly Chronic  subcortical infarcts are also present within the bilateral cerebral white matter, basal ganglia and thalami. Small chronic infarcts are present within the bilateral cerebellar hemispheres. Severe scattered and confluent T2/FLAIR hyperintensity within the cerebral white matter is nonspecific, but compatible chronic small vessel ischemic disease. To a lesser degree, chronic small vessel ischemic changes are also present within the pons. Mild SWI signal loss within the bilateral basal ganglia which may reflect chronic blood products or mineralization. No evidence of intracranial mass. No extra-axial fluid collection. No midline shift. Vascular: Expected proximal arterial flow voids. Skull and upper cervical spine: No focal marrow lesion. Cervical spondylosis. Sinuses/Orbits: Visualized orbits show no acute finding. Mild ethmoid and left maxillary sinus mucosal thickening. Small right maxillary sinus mucous retention cyst. Polypoid soft tissue within the anterior left nasal passage. IMPRESSION: Motion degraded examination. 1.3 cm acute infarct within the right temporoparietal periventricular white matter. 2.1 cm acute infarct  within the right corona radiata, possibly also involving portions of the adjacent right caudate nucleus. A few small subtle acute cortical infarcts are also suspected within the right parietal lobe. Severe chronic small vessel ischemic disease with remote supratentorial cortical and subcortical infarcts, and remote bilateral cerebellar infarcts. Paranasal sinus disease as described. Electronically Signed   By: Jackey Loge DO   On: 10/15/2020 07:56   CT HEAD CODE STROKE WO CONTRAST  Result Date: 10/15/2020 CLINICAL DATA:  Code stroke.  Left leg drift EXAM: CT HEAD WITHOUT CONTRAST TECHNIQUE: Contiguous axial images were obtained from the base of the skull through the vertex without intravenous contrast. COMPARISON:  None. FINDINGS: Brain: No acute cortical infarct, hemorrhage, hydrocephalus,  or masslike finding. Numerous remote subcortical infarcts, lacunar and perforator, traversing the basal ganglia and deep white matter tracks. Chronic small vessel ischemia in the cerebral white matter that is confluent. There has been prior patchy cortical infarction in the anterior division right MCA territory. Small remote right cerebellar infarcts. Vascular: Mid basilar appears high-density on axial slices, likely from streak artifact based on reformats. Skull: Nonspecific sclerosis in the clivus, the overall benign appearing margins. Sinuses/Orbits: Mucosal thickening in the paranasal sinuses and nasal cavity. Other: These results were called by telephone at the time of interpretation on 10/15/2020 at 4:25 am to provider Dr Derry Lory who verbally acknowledged these results. ASPECTS [facility] Stroke Program Early CT Score) Not scored given the extent of chronic changes. IMPRESSION: 1. Advanced chronic small vessel disease with numerous remote cortical and subcortical infarcts. 2. Intermittently high-density basilar which is likely from streak artifact. CTA is pending. Electronically Signed   By: Marnee Spring M.D.   On: 10/15/2020 04:30   CT ANGIO HEAD CODE STROKE  Result Date: 10/15/2020 CLINICAL DATA:  Left leg drift.  History of stroke EXAM: CT ANGIOGRAPHY HEAD AND NECK TECHNIQUE: Multidetector CT imaging of the head and neck was performed using the standard protocol during bolus administration of intravenous contrast. Multiplanar CT image reconstructions and MIPs were obtained to evaluate the vascular anatomy. Carotid stenosis measurements (when applicable) are obtained utilizing NASCET criteria, using the distal internal carotid diameter as the denominator. CONTRAST:  77mL OMNIPAQUE IOHEXOL 350 MG/ML SOLN COMPARISON:  Head CT from earlier the same day FINDINGS: CTA NECK FINDINGS Aortic arch: 3 vessel branching.  No acute finding or dilatation. Right carotid system: Vessels are smooth and widely patent  with no noted atheromatous changes. Left carotid system: Vessels are smooth and widely patent with mild atheromatous changes at the bifurcation. Vertebral arteries: No proximal subclavian stenosis. The vertebral arteries are smooth and widely patent. Skeleton: Few remaining teeth with osteitis around the carious right lower molar. Isolated sclerotic focus in the clivus which has an overall benign appearance. There is multilevel degenerative disc narrowing and ridging. Other neck: Negative Upper chest: Airway thickening at the apices. Review of the MIP images confirms the above findings CTA HEAD FINDINGS Anterior circulation: Mild atheromatous narrowing and calcification of bilateral carotid siphons. Branch vessel atheromatous type irregularity, most notably affecting the right M1 segment where there is moderate stenosis that is most prominent on coronal reformats. 1 mm outpouchings of the bilateral ophthalmic ICA. Posterior circulation: The vertebral and basilar arteries are smooth and widely patent. Moderate right P2 segment narrowing. Atheromatous irregularity of bilateral PCA branches. Negative for aneurysm Venous sinuses: Patent Anatomic variants: None unusual Review of the MIP images confirms the above findings IMPRESSION: 1. No emergent finding. 2. Primarily intracranial atherosclerosis most notably causing right  M1 and right P2 moderate narrowings. 3. 1 mm outpouchings at the bilateral ophthalmic ICA which could be infundibulum or early aneurysm. Electronically Signed   By: Marnee Spring M.D.   On: 10/15/2020 04:49   CT ANGIO NECK CODE STROKE  Result Date: 10/15/2020 CLINICAL DATA:  Left leg drift.  History of stroke EXAM: CT ANGIOGRAPHY HEAD AND NECK TECHNIQUE: Multidetector CT imaging of the head and neck was performed using the standard protocol during bolus administration of intravenous contrast. Multiplanar CT image reconstructions and MIPs were obtained to evaluate the vascular anatomy. Carotid  stenosis measurements (when applicable) are obtained utilizing NASCET criteria, using the distal internal carotid diameter as the denominator. CONTRAST:  80mL OMNIPAQUE IOHEXOL 350 MG/ML SOLN COMPARISON:  Head CT from earlier the same day FINDINGS: CTA NECK FINDINGS Aortic arch: 3 vessel branching.  No acute finding or dilatation. Right carotid system: Vessels are smooth and widely patent with no noted atheromatous changes. Left carotid system: Vessels are smooth and widely patent with mild atheromatous changes at the bifurcation. Vertebral arteries: No proximal subclavian stenosis. The vertebral arteries are smooth and widely patent. Skeleton: Few remaining teeth with osteitis around the carious right lower molar. Isolated sclerotic focus in the clivus which has an overall benign appearance. There is multilevel degenerative disc narrowing and ridging. Other neck: Negative Upper chest: Airway thickening at the apices. Review of the MIP images confirms the above findings CTA HEAD FINDINGS Anterior circulation: Mild atheromatous narrowing and calcification of bilateral carotid siphons. Branch vessel atheromatous type irregularity, most notably affecting the right M1 segment where there is moderate stenosis that is most prominent on coronal reformats. 1 mm outpouchings of the bilateral ophthalmic ICA. Posterior circulation: The vertebral and basilar arteries are smooth and widely patent. Moderate right P2 segment narrowing. Atheromatous irregularity of bilateral PCA branches. Negative for aneurysm Venous sinuses: Patent Anatomic variants: None unusual Review of the MIP images confirms the above findings IMPRESSION: 1. No emergent finding. 2. Primarily intracranial atherosclerosis most notably causing right M1 and right P2 moderate narrowings. 3. 1 mm outpouchings at the bilateral ophthalmic ICA which could be infundibulum or early aneurysm. Electronically Signed   By: Marnee Spring M.D.   On: 10/15/2020 04:49     EKG: Independently reviewed.  Sinus bradycardia with rate 59; LVH; nonspecific ST changes with no evidence of acute ischemia   Labs on Admission: I have personally reviewed the available labs and imaging studies at the time of the admission.  Pertinent labs:   K+ 3.3 Glucose 121 BUN 12/Creatinine 1.43/GFR 5 Unremarkable CBC COVID/flu negative   Assessment/Plan Principal Problem:   Acute CVA (cerebrovascular accident) (HCC) Active Problems:   Essential hypertension   Dyslipidemia   Diabetes mellitus with neurological manifestation (HCC)   Marijuana abuse   Tobacco dependence    CVA -Patient with prior h/o CVA (records not available, have been requested); sister reports persistent L hemiplegia although patient denies deficits at baseline -He presented with worsening left-sided weakness -ABCD2 score is 7, high risk -tPA can be given within 4.5 hours of symptom onset; this patient was not deemed to be a candidate for tPA therapy due to the prolonged time since onset of symptoms. -Aspirin has been given to reduce stroke mortality and decrease morbidity -Will admit for further CVA evaluation -Telemetry monitoring -MRI confirms multiple infarcts -CTA without apparent stenosis in need of intervention -Echo pending -If the patient does not have known afib and this is not detected on telemetry during hospitalization, consider outpatient  Holter monitoring and/or loop recorder placement. -Risk stratification with FLP, A1c; will also check TSH and UDS -Patient will need DAPT for 21 days when ABCD2 score is at least 4 and NIH score is 3 or less, and then can transition to monotherapy with a single antiplatelet agent.  Neurology has ordered ASA + Plavix. -Neurology consult -PT/OT/ST/Nutrition Consults  HTN -Allow permissive HTN for now -Treat BP only if >220/120, and then with goal of 15% reduction -Hold HCTZ and Cozaar and plan to restart in 48-72 hours   HLD -Check  FLP -Start empiric Lipitor 40 mg daily   DM -Will check A1c -Hold home PO Glucophage -Will order moderate-scale SSI  Marijuana dependence -Cessation encouraged; this should be encouraged on an ongoing basis -UDS ordered  Tobacco dependence -Encourage cessation.   -This was discussed with the patient and should be reviewed on an ongoing basis.   -Patch declined by patient.     Note: This patient has been tested and is negative for the novel coronavirus COVID-19. He has been fully vaccinated against COVID-19.    DVT prophylaxis:  Lovenox  Code Status: Full - confirmed with patient Family Communication: I spoke with his sister at the time of admission Disposition Plan:  The patient is from: home  Anticipated d/c is to: be determined  Anticipated d/c date will depend on clinical response to treatment, but likely 2-3 days  Patient is currently: acutely ill Consults called: Neurology; PT/OT/ST/Nutrition Admission status: Admit - It is my clinical opinion that admission to INPATIENT is reasonable and necessary because of the expectation that this patient will require hospital care that crosses at least 2 midnights to treat this condition based on the medical complexity of the problems presented.  Given the aforementioned information, the predictability of an adverse outcome is felt to be significant.    Jonah BlueJennifer Dajanique Robley MD [facility]   How to contact the [facility] Attending or Consulting provider 7A - 7P or covering provider during after hours 7P -7A, for this patient?  1. Check the care team in [facility] and look for a) attending/consulting [facility] provider listed and b) the [facility] team listed 2. Log into [email or web address] and use [place]'s universal password to access. If you do not have the password, please contact the hospital operator. 3. Locate the [facility] provider you are looking for under [facility] and page to a number that you can be directly reached. 4. If you still have  difficulty reaching the provider, please page the [facility] (Director on Call) for the Hospitalists listed on amion for assistance.   10/15/2020, 10:13 AM

## 2020-10-15 NOTE — Progress Notes (Signed)
STROKE TEAM PROGRESS NOTE   INTERVAL HISTORY I have personally reviewed history of presenting illness, electronic medical records and imaging films in PACS.  He presented with worsening left-sided weakness and MRI scan shows right temporoparietal periventricular and right corona radiata infarcts with CT angios showing intracranial atherosclerosis in the right M1 and right P2 segments.  Echo shows normal ejection fraction without obvious cardiac source of embolism.  Hemoglobin A1c is 5.9.  Vitals:   10/15/20 0815 10/15/20 0830 10/15/20 0915 10/15/20 0926  BP: (!) 185/100 (!) 192/90 (!) 174/86 (!) 174/86  Pulse: (!) 50 (!) 48 71 (!) 57  Resp: 10 10  11   Temp:      TempSrc:      SpO2: 98% 99% 100% 98%   CBC:  Recent Labs  Lab 10/15/20 0417 10/15/20 0422  WBC 4.6  --   NEUTROABS 2.2  --   HGB 14.8 15.6  HCT 42.6 46.0  MCV 78.6*  --   PLT 210  --    Basic Metabolic Panel:  Recent Labs  Lab 10/15/20 0417 10/15/20 0422  NA 143 144  K 3.3* 3.2*  CL 109 105  CO2 26  --   GLUCOSE 121* 115*  BUN 12 14  CREATININE 1.43* 1.30*  CALCIUM 8.6*  --    Lipid Panel: No results for input(s): CHOL, TRIG, HDL, CHOLHDL, VLDL, LDLCALC in the last 168 hours. HgbA1c:  Recent Labs  Lab 10/15/20 1126  HGBA1C 5.9*   Urine Drug Screen: No results for input(s): LABOPIA, COCAINSCRNUR, LABBENZ, AMPHETMU, THCU, LABBARB in the last 168 hours.  Alcohol Level No results for input(s): ETH in the last 168 hours.  IMAGING past 24 hours MR BRAIN WO CONTRAST  Result Date: 10/15/2020 CLINICAL DATA:  Neuro deficit, acute, stroke suspected. EXAM: MRI HEAD WITHOUT CONTRAST TECHNIQUE: Multiplanar, multiecho pulse sequences of the brain and surrounding structures were obtained without intravenous contrast. COMPARISON:  Noncontrast head CT and CT angiogram head/neck 10/15/2020. FINDINGS: Brain: Intermittent motion degradation. Most notably, there is moderate motion degradation of the coronal T2 weighted  sequence. 0.7 x 1.3 x 0.5 cm focus of restricted diffusion within the right temporoparietal periventricular white matter consistent with acute infarction (series 5, image 76). 2.1 x 0.8 x 0.9 cm focus of restricted diffusion within the right corona radiata, possibly also involving portions of the adjacent caudate nucleus, compatible with acute infarction (series 5, image 80). A few small subtle acute cortical infarcts are also suspected within the right parietal lobe (for instance as seen on series 5, image 79) (series 7, image 45). Chronic cortical/subcortical infarct within the mid to anterior and lateral right frontal lobe/right frontal operculum. This infarct extends to the right insula anteriorly Chronic subcortical infarcts are also present within the bilateral cerebral white matter, basal ganglia and thalami. Small chronic infarcts are present within the bilateral cerebellar hemispheres. Severe scattered and confluent T2/FLAIR hyperintensity within the cerebral white matter is nonspecific, but compatible chronic small vessel ischemic disease. To a lesser degree, chronic small vessel ischemic changes are also present within the pons. Mild SWI signal loss within the bilateral basal ganglia which may reflect chronic blood products or mineralization. No evidence of intracranial mass. No extra-axial fluid collection. No midline shift. Vascular: Expected proximal arterial flow voids. Skull and upper cervical spine: No focal marrow lesion. Cervical spondylosis. Sinuses/Orbits: Visualized orbits show no acute finding. Mild ethmoid and left maxillary sinus mucosal thickening. Small right maxillary sinus mucous retention cyst. Polypoid soft tissue within the anterior  left nasal passage. IMPRESSION: Motion degraded examination. 1.3 cm acute infarct within the right temporoparietal periventricular white matter. 2.1 cm acute infarct within the right corona radiata, possibly also involving portions of the adjacent right  caudate nucleus. A few small subtle acute cortical infarcts are also suspected within the right parietal lobe. Severe chronic small vessel ischemic disease with remote supratentorial cortical and subcortical infarcts, and remote bilateral cerebellar infarcts. Paranasal sinus disease as described. Electronically Signed   By: Jackey LogeKyle  Golden DO   On: 10/15/2020 07:56   CT HEAD CODE STROKE WO CONTRAST  Result Date: 10/15/2020 CLINICAL DATA:  Code stroke.  Left leg drift EXAM: CT HEAD WITHOUT CONTRAST TECHNIQUE: Contiguous axial images were obtained from the base of the skull through the vertex without intravenous contrast. COMPARISON:  None. FINDINGS: Brain: No acute cortical infarct, hemorrhage, hydrocephalus, or masslike finding. Numerous remote subcortical infarcts, lacunar and perforator, traversing the basal ganglia and deep white matter tracks. Chronic small vessel ischemia in the cerebral white matter that is confluent. There has been prior patchy cortical infarction in the anterior division right MCA territory. Small remote right cerebellar infarcts. Vascular: Mid basilar appears high-density on axial slices, likely from streak artifact based on reformats. Skull: Nonspecific sclerosis in the clivus, the overall benign appearing margins. Sinuses/Orbits: Mucosal thickening in the paranasal sinuses and nasal cavity. Other: These results were called by telephone at the time of interpretation on 10/15/2020 at 4:25 am to provider Dr Derry LoryKhaliqdina who verbally acknowledged these results. ASPECTS [facility]([place] Stroke Program Early CT Score) Not scored given the extent of chronic changes. IMPRESSION: 1. Advanced chronic small vessel disease with numerous remote cortical and subcortical infarcts. 2. Intermittently high-density basilar which is likely from streak artifact. CTA is pending. Electronically Signed   By: Marnee SpringJonathon  Watts M.D.   On: 10/15/2020 04:30   CT ANGIO HEAD CODE STROKE  Result Date: 10/15/2020 CLINICAL  DATA:  Left leg drift.  History of stroke EXAM: CT ANGIOGRAPHY HEAD AND NECK TECHNIQUE: Multidetector CT imaging of the head and neck was performed using the standard protocol during bolus administration of intravenous contrast. Multiplanar CT image reconstructions and MIPs were obtained to evaluate the vascular anatomy. Carotid stenosis measurements (when applicable) are obtained utilizing NASCET criteria, using the distal internal carotid diameter as the denominator. CONTRAST:  80mL OMNIPAQUE IOHEXOL 350 MG/ML SOLN COMPARISON:  Head CT from earlier the same day FINDINGS: CTA NECK FINDINGS Aortic arch: 3 vessel branching.  No acute finding or dilatation. Right carotid system: Vessels are smooth and widely patent with no noted atheromatous changes. Left carotid system: Vessels are smooth and widely patent with mild atheromatous changes at the bifurcation. Vertebral arteries: No proximal subclavian stenosis. The vertebral arteries are smooth and widely patent. Skeleton: Few remaining teeth with osteitis around the carious right lower molar. Isolated sclerotic focus in the clivus which has an overall benign appearance. There is multilevel degenerative disc narrowing and ridging. Other neck: Negative Upper chest: Airway thickening at the apices. Review of the MIP images confirms the above findings CTA HEAD FINDINGS Anterior circulation: Mild atheromatous narrowing and calcification of bilateral carotid siphons. Branch vessel atheromatous type irregularity, most notably affecting the right M1 segment where there is moderate stenosis that is most prominent on coronal reformats. 1 mm outpouchings of the bilateral ophthalmic ICA. Posterior circulation: The vertebral and basilar arteries are smooth and widely patent. Moderate right P2 segment narrowing. Atheromatous irregularity of bilateral PCA branches. Negative for aneurysm Venous sinuses: Patent Anatomic variants: None unusual  Review of the MIP images confirms the above  findings IMPRESSION: 1. No emergent finding. 2. Primarily intracranial atherosclerosis most notably causing right M1 and right P2 moderate narrowings. 3. 1 mm outpouchings at the bilateral ophthalmic ICA which could be infundibulum or early aneurysm. Electronically Signed   By: Marnee Spring M.D.   On: 10/15/2020 04:49   CT ANGIO NECK CODE STROKE  Result Date: 10/15/2020 CLINICAL DATA:  Left leg drift.  History of stroke EXAM: CT ANGIOGRAPHY HEAD AND NECK TECHNIQUE: Multidetector CT imaging of the head and neck was performed using the standard protocol during bolus administration of intravenous contrast. Multiplanar CT image reconstructions and MIPs were obtained to evaluate the vascular anatomy. Carotid stenosis measurements (when applicable) are obtained utilizing NASCET criteria, using the distal internal carotid diameter as the denominator. CONTRAST:  38mL OMNIPAQUE IOHEXOL 350 MG/ML SOLN COMPARISON:  Head CT from earlier the same day FINDINGS: CTA NECK FINDINGS Aortic arch: 3 vessel branching.  No acute finding or dilatation. Right carotid system: Vessels are smooth and widely patent with no noted atheromatous changes. Left carotid system: Vessels are smooth and widely patent with mild atheromatous changes at the bifurcation. Vertebral arteries: No proximal subclavian stenosis. The vertebral arteries are smooth and widely patent. Skeleton: Few remaining teeth with osteitis around the carious right lower molar. Isolated sclerotic focus in the clivus which has an overall benign appearance. There is multilevel degenerative disc narrowing and ridging. Other neck: Negative Upper chest: Airway thickening at the apices. Review of the MIP images confirms the above findings CTA HEAD FINDINGS Anterior circulation: Mild atheromatous narrowing and calcification of bilateral carotid siphons. Branch vessel atheromatous type irregularity, most notably affecting the right M1 segment where there is moderate stenosis that  is most prominent on coronal reformats. 1 mm outpouchings of the bilateral ophthalmic ICA. Posterior circulation: The vertebral and basilar arteries are smooth and widely patent. Moderate right P2 segment narrowing. Atheromatous irregularity of bilateral PCA branches. Negative for aneurysm Venous sinuses: Patent Anatomic variants: None unusual Review of the MIP images confirms the above findings IMPRESSION: 1. No emergent finding. 2. Primarily intracranial atherosclerosis most notably causing right M1 and right P2 moderate narrowings. 3. 1 mm outpouchings at the bilateral ophthalmic ICA which could be infundibulum or early aneurysm. Electronically Signed   By: Marnee Spring M.D.   On: 10/15/2020 04:49    PHYSICAL EXAM Pleasant middle-age male not in distress. . Afebrile. Head is nontraumatic. Neck is supple without bruit.    Cardiac exam no murmur or gallop. Lungs are clear to auscultation. Distal pulses are well felt. Neurological Exam :  Awake alert oriented x 3 normal speech and language. Mild left lower face asymmetry. Tongue midline. No drift. Mild diminished fine finger movements on left. Orbits right over left upper extremity. Mild left grip weak.. Mild left hip flexor weakness 4+/5.  Normal sensation . Normal coordination.  ASSESSMENT/PLAN [name] is a 59 y.o. male with history of DM2, HTN, strokes, R MCA stroke with some residual L sided weakness presenting with worsening L sided weakness.   Stroke:   Multiple R brain infarcts thromboembolic secondary to large vessel disease intracranial atherosclerosis  Code Stroke CT head advanced Small vessel disease. Many old cortical and subcortical infarcts. High density BA likely artifact  CTA head & neck no ELVO. R M1 and R P2 intracranial narrowing. B ophthalmic ICA 95mm infundibulum vs early aneurysm  MRI  R temporoparietal periventricular white matter infarct. R corona radiata infarct. Possible subtle  cortical R parietal infarcts.  Severe small vessel disease. And multiple old infarcts.   2D Echo normal ejection fraction.  LDL pending   HgbA1c 5.9  VTE prophylaxis - Lovenox 40 mg sq daily   No antithrombotic prior to admission, now on aspirin 81 mg daily and clopidogrel 75 mg daily. Continue DAPT x 3 months then aspirin alone. Add Pepcid given indigestion with plans for continued antithrombotics.    Therapy recommendations:  pending   Disposition:  pending   Hypertensive Urgency  On the high side . Permissive hypertension (OK if < 220/120) but gradually normalize in 5-7 days . Long-term BP goal normotensive  Hyperlipidemia  Home meds:  No statin  Now on lipitor 40  LDL pending, goal < 70  Continue statin at discharge  Diabetes type II Controlled  HgbA1c 5.9, goal < 7.0  Other Stroke Risk Factors  Cigarette smoker, advised to stop smoking  Substance abuse (THC) - UDS not done  Hx stroke/TIA  R MCA w/ residual L HP - details unknown  Other Active Problems  Hypokalemia 3.3->3.2  Hospital day # 0 Recommend aspirin and Plavix for 3 months followed by aspirin alone and aggressive risk factor modification with strict control of hypertension with blood pressure goal below 130/90, lipids with LDL cholesterol goal below 70 mg percent and diabetes with hemoglobin A1c goal below 6.5%.  Patient counseled to quit smoking and is agreeable.  Continue ongoing stroke work-up and therapy consults.  Discussed with Dr. Ophelia Charter.  Greater than 50% time during this 35-minute visit was spent on counseling and coordination of care about his stroke and intracranial atherosclerosis and need to quit smoking and answering questions.  Follow-up as an outpatient stroke clinic in 6 weeks.  Stroke team will sign off.  Kindly call for questions. Delia Heady, MD To contact Stroke Continuity provider, please refer to [email or web address]. After hours, contact General Neurology

## 2020-10-15 NOTE — Consult Note (Signed)
NEUROLOGY CONSULTATION NOTE   Date of service: October 15, 2020 Patient Name: Calvin Jackson MRN:  [id number] DOB:  [birth year] Reason for consult: "Stroke code"  History of Present Illness  Calvin Jackson is a 59 y.o. male with PMH significant for DM2, HTN, strokes, R MCA stroke with some residual L sided weakness who presents with  Worsening of his left sided weakness.  Symptoms started at 2000 on 10/14/20. He was in the kitchen eating some potatoes while standing. Felt both of his knees were weak. Went to bed, woke up to use bathroom and could not get him self to stand up and felt worsening of left sided weakness so he came to the ED.  He smokes 1 pack every 2 days, occasional marijuana, not on aspirin or plavix at home per patient.  NIHSS: 0 MRS: 0 TPA: outside window Thrombectomy: no, low NIHSS.   ROS   Constitutional Denies weight loss, fever and chills.  HEENT Denies changes in vision and hearing.  Respiratory Denies SOB and cough.  CV Denies palpitations and CP  GI Denies abdominal pain, nausea, vomiting and diarrhea.  GU Denies dysuria and urinary frequency.  MSK Denies myalgia and joint pain.  Skin Denies rash and pruritus.  Neurological Denies headache and syncope.  Psychiatric Denies recent changes in mood. Denies anxiety and depression.   Past History   Past Medical History:  Diagnosis Date  . Stroke [facility])    History reviewed. No pertinent surgical history. No family history on file. Social History   Socioeconomic History  . Marital status: Not on file    Spouse name: Not on file  . Number of children: Not on file  . Years of education: Not on file  . Highest education level: Not on file  Occupational History  . Not on file  Tobacco Use  . Smoking status: Not on file  Substance and Sexual Activity  . Alcohol use: Not on file  . Drug use: Not on file  . Sexual activity: Not on file  Other Topics Concern  . Not on file  Social History Narrative  .  Not on file   Social Determinants of Health   Financial Resource Strain:   . Difficulty of Paying Living Expenses: Not on file  Food Insecurity:   . Worried About Programme researcher, broadcasting/film/video in the Last Year: Not on file  . Ran Out of Food in the Last Year: Not on file  Transportation Needs:   . Lack of Transportation (Medical): Not on file  . Lack of Transportation (Non-Medical): Not on file  Physical Activity:   . Days of Exercise per Week: Not on file  . Minutes of Exercise per Session: Not on file  Stress:   . Feeling of Stress : Not on file  Social Connections:   . Frequency of Communication with Friends and Family: Not on file  . Frequency of Social Gatherings with Friends and Family: Not on file  . Attends Religious Services: Not on file  . Active Member of Clubs or Organizations: Not on file  . Attends Banker Meetings: Not on file  . Marital Status: Not on file   No Known Allergies  Medications  (Not in a hospital admission)    Vitals   Vitals:   10/15/20 0409  BP: (!) 171/145  Pulse: (!) 57  Resp: 16  Temp: 98.2 F (36.8 C)  TempSrc: Oral  SpO2: 100%     There is no height or weight  on file to calculate BMI.  Physical Exam   General: Laying comfortably in bed; in no acute distress. HENT: Normal oropharynx and mucosa. Normal external appearance of ears and nose. Neck: Supple, no pain or tenderness CV: No JVD. No peripheral edema. Pulmonary: Symmetric Chest rise. Normal respiratory effort. Abdomen: Soft to touch, non-tender.  Ext: No cyanosis, edema, or deformity  Skin: No rash. Normal palpation of skin.   Musculoskeletal: Normal digits and nails by inspection. No clubbing.   Neurologic Examination  Mental status/Cognition: Alert, oriented to self, place, month and year, good attention.  Speech/language: Fluent, comprehension intact, object naming intact, repetition intact. Cranial nerves:   CN II Pupils equal and reactive to light, no VF  deficits   CN III,IV,VI EOM intact, no gaze preference or deviation, no nystagmus   CN V normal sensation in V1, V2, and V3 segments bilaterally   CN VII no asymmetry, no nasolabial fold flattening   CN VIII normal hearing to speech   CN IX & X normal palatal elevation, no uvular deviation   CN XI 5/5 head turn and 5/5 shoulder shrug bilaterally   CN XII midline tongue protrusion   Motor:  Muscle bulk: normal, tone normal, pronator drift yes LUE, tremor: none Mvmt Root Nerve  Muscle Right Left Comments  SA C5/6 Ax Deltoid 5 5   EF C5/6 Mc Biceps 5 5   EE C6/7/8 Rad Triceps 5 5   WF C6/7 Med FCR 5 5   WE C7/8 PIN ECU 5 5   F Ab C8/T1 U ADM/FDI 5 4+   HF L1/2/3 Fem Illopsoas 5 4   KE L2/3/4 Fem Quad 5    DF L4/5 D Peron Tib Ant 5    PF S1/2 Tibial Grc/Sol 5     Reflexes:  Right Left Comments  Pectoralis      Biceps (C5/6) 2+ 2+   Brachioradialis (C5/6) 2+ 2+    Triceps (C6/7) 2+ 2+    Patellar (L3/4) 3 3 Cross adductors positive BL.   Achilles (S1) 4 4 2-3 beats of clonus BL   Hoffman      Plantar     Jaw jerk    Sensation:  Light touch    Pin prick    Temperature    Vibration   Proprioception    Coordination/Complex Motor:  - Finger to Nose intact BL - Heel to shin intact BL - Rapid alternating movement are normal - Gait: deferred  Labs   CBC:  Recent Labs  Lab 10/15/20 0417 10/15/20 0422  WBC 4.6  --   NEUTROABS 2.2  --   HGB 14.8 15.6  HCT 42.6 46.0  MCV 78.6*  --   PLT 210  --     Basic Metabolic Panel:  Lab Results  Component Value Date   NA 144 10/15/2020   K 3.2 (L) 10/15/2020   GLUCOSE 115 (H) 10/15/2020   BUN 14 10/15/2020   CREATININE 1.30 (H) 10/15/2020   Lipid Panel: No results found for: LDLCALC HgbA1c: No results found for: HGBA1C Urine Drug Screen: No results found for: LABOPIA, COCAINSCRNUR, LABBENZ, AMPHETMU, THCU, LABBARB  Alcohol Level No results found for: [facility]  CTH without contrast: IMPRESSION: 1. Advanced chronic small  vessel disease with numerous remote cortical and subcortical infarcts. 2. Intermittently high-density basilar which is likely from streak artifact. CTA is pending.   Impression   Calvin Jackson is a 59 y.o. male with PMH significant for DM2, HTN, strokes, R MCA stroke  with some residual L sided weakness who presents with worsening of his left sided weakness.Marland Kitchen His neurologic examination is notable for left arm and leg weakness.  Suspect TIA vs minor ischemic stroke.  He is out of tPA window, has NIHSS of 0 right now and not a candidate for thrombectomy due to low NIHSS.  Recommendations  - Frequent Neuro checks per stroke unit protocol - Recommend brain imaging with MRI Brain without contrast - Recommend obtaining TTE - Recommend obtaining Lipid panel with LDL - Please start statin if LDL > 70 - Recommend HbA1c - Antithrombotic - Aspirin 81mg  and plavix 75mg  x 3 weeks, followed by Aspirin 81mg  daily - Recommend DVT ppx - SBP goal - permissive hypertension first 24 h < 220/110. Held home meds.  - Recommend Telemetry monitoring for arrythmia - Recommend bedside swallow screen prior to PO intake. - Stroke education booklet - Recommend PT/OT/SLP consult ______________________________________________________________________   Thank you for the opportunity to take part in the care of this patient. If you have any further questions, please contact the neurology consultation attending.  Signed,  [facility] Pager Number 

## 2020-10-15 NOTE — ED Notes (Signed)
Pt to MRI via stretcher.

## 2020-10-15 NOTE — ED Provider Notes (Signed)
[facility] EMERGENCY DEPARTMENT Provider Note   CSN: [id number] Arrival date & time: 10/15/20  0407   History Chief Complaint  Patient presents with  . Code Stroke    Calvin Jackson is a 59 y.o. male.  The history is provided by the patient.  He has history of stroke with left leg weakness and comes in with increased weakness in the left leg.  He noted the weakness at about 8 PM.  He was unable to walk on that leg whereas he normally is able to ambulate and, when he fell, he was unable to get up.  He denies any injury with fall.  He denies any headache.  This is weaker than he has been at any time since he had the stroke.  Last drink occurred about 2 years ago.  Past Medical History:  Diagnosis Date  . Stroke [employer])     There are no problems to display for this patient.   History reviewed. No pertinent surgical history.     No family history on file.  Social History   Tobacco Use  . Smoking status: Not on file  Substance Use Topics  . Alcohol use: Not on file  . Drug use: Not on file    Home Medications Prior to Admission medications   Not on File    Allergies    Patient has no known allergies.  Review of Systems   Review of Systems  All other systems reviewed and are negative.   Physical Exam Updated Vital Signs BP (!) 171/145   Pulse (!) 57   Temp 98.2 F (36.8 C) (Oral)   Resp 16   SpO2 100%   Physical Exam Vitals and nursing note reviewed.   59 year old male, resting comfortably and in no acute distress. Vital signs are significant for elevated blood pressure and borderline low heart rate. Oxygen saturation is 100%, which is normal. Head is normocephalic and atraumatic. PERRLA, EOMI. Oropharynx is clear. Neck is nontender and supple without adenopathy or JVD.  There are no carotid bruits. Back is nontender and there is no CVA tenderness. Lungs are clear without rales, wheezes, or rhonchi. Chest is nontender. Heart has  regular rate and rhythm without murmur. Abdomen is soft, flat, nontender without masses or hepatosplenomegaly and peristalsis is normoactive. Extremities have no cyanosis or edema, full range of motion is present. Skin is warm and dry without rash. Neurologic: Mental status is normal, cranial nerves are intact.  There is no pronator drift.  Strength is 5/5 in both arms and in the right leg.  Left leg strength is 3/5.  ED Results / Procedures / Treatments   Labs (all labs ordered are listed, but only abnormal results are displayed) Labs Reviewed  CBC - Abnormal; Notable for the following components:      Result Value   MCV 78.6 (*)    All other components within normal limits  COMPREHENSIVE METABOLIC PANEL - Abnormal; Notable for the following components:   Potassium 3.3 (*)    Glucose, Bld 121 (*)    Creatinine, Ser 1.43 (*)    Calcium 8.6 (*)    Albumin 3.4 (*)    GFR, Estimated 56 (*)    All other components within normal limits  I-STAT CHEM 8, ED - Abnormal; Notable for the following components:   Potassium 3.2 (*)    Creatinine, Ser 1.30 (*)    Glucose, Bld 115 (*)    Calcium, Ion 1.13 (*)  All other components within normal limits  CBG MONITORING, ED - Abnormal; Notable for the following components:   Glucose-Capillary 112 (*)    All other components within normal limits  RESPIRATORY PANEL BY RT PCR (FLU A&B, COVID)  PROTIME-INR  APTT  DIFFERENTIAL    EKG Normal sinus rhythm 59 bpm.  Normal axis.  Possible left atrial enlargement.  Normal intervals.  Voltage criteria for left ventricular hypertrophy present.  Normal ST and T waves.  No previous ECG available for comparison.  Radiology CT HEAD CODE STROKE WO CONTRAST  Result Date: 10/15/2020 CLINICAL DATA:  Code stroke.  Left leg drift EXAM: CT HEAD WITHOUT CONTRAST TECHNIQUE: Contiguous axial images were obtained from the base of the skull through the vertex without intravenous contrast. COMPARISON:  None. FINDINGS:  Brain: No acute cortical infarct, hemorrhage, hydrocephalus, or masslike finding. Numerous remote subcortical infarcts, lacunar and perforator, traversing the basal ganglia and deep white matter tracks. Chronic small vessel ischemia in the cerebral white matter that is confluent. There has been prior patchy cortical infarction in the anterior division right MCA territory. Small remote right cerebellar infarcts. Vascular: Mid basilar appears high-density on axial slices, likely from streak artifact based on reformats. Skull: Nonspecific sclerosis in the clivus, the overall benign appearing margins. Sinuses/Orbits: Mucosal thickening in the paranasal sinuses and nasal cavity. Other: These results were called by telephone at the time of interpretation on 10/15/2020 at 4:25 am to provider Dr Derry Lory who verbally acknowledged these results. ASPECTS [facility] Stroke Program Early CT Score) Not scored given the extent of chronic changes. IMPRESSION: 1. Advanced chronic small vessel disease with numerous remote cortical and subcortical infarcts. 2. Intermittently high-density basilar which is likely from streak artifact. CTA is pending. Electronically Signed   By: Marnee Spring M.D.   On: 10/15/2020 04:30   CT ANGIO HEAD CODE STROKE  Result Date: 10/15/2020 CLINICAL DATA:  Left leg drift.  History of stroke EXAM: CT ANGIOGRAPHY HEAD AND NECK TECHNIQUE: Multidetector CT imaging of the head and neck was performed using the standard protocol during bolus administration of intravenous contrast. Multiplanar CT image reconstructions and MIPs were obtained to evaluate the vascular anatomy. Carotid stenosis measurements (when applicable) are obtained utilizing NASCET criteria, using the distal internal carotid diameter as the denominator. CONTRAST:  58mL OMNIPAQUE IOHEXOL 350 MG/ML SOLN COMPARISON:  Head CT from earlier the same day FINDINGS: CTA NECK FINDINGS Aortic arch: 3 vessel branching.  No acute finding or  dilatation. Right carotid system: Vessels are smooth and widely patent with no noted atheromatous changes. Left carotid system: Vessels are smooth and widely patent with mild atheromatous changes at the bifurcation. Vertebral arteries: No proximal subclavian stenosis. The vertebral arteries are smooth and widely patent. Skeleton: Few remaining teeth with osteitis around the carious right lower molar. Isolated sclerotic focus in the clivus which has an overall benign appearance. There is multilevel degenerative disc narrowing and ridging. Other neck: Negative Upper chest: Airway thickening at the apices. Review of the MIP images confirms the above findings CTA HEAD FINDINGS Anterior circulation: Mild atheromatous narrowing and calcification of bilateral carotid siphons. Branch vessel atheromatous type irregularity, most notably affecting the right M1 segment where there is moderate stenosis that is most prominent on coronal reformats. 1 mm outpouchings of the bilateral ophthalmic ICA. Posterior circulation: The vertebral and basilar arteries are smooth and widely patent. Moderate right P2 segment narrowing. Atheromatous irregularity of bilateral PCA branches. Negative for aneurysm Venous sinuses: Patent Anatomic variants: None unusual Review of  the MIP images confirms the above findings IMPRESSION: 1. No emergent finding. 2. Primarily intracranial atherosclerosis most notably causing right M1 and right P2 moderate narrowings. 3. 1 mm outpouchings at the bilateral ophthalmic ICA which could be infundibulum or early aneurysm. Electronically Signed   By: Marnee Spring M.D.   On: 10/15/2020 04:49   CT ANGIO NECK CODE STROKE  Result Date: 10/15/2020 CLINICAL DATA:  Left leg drift.  History of stroke EXAM: CT ANGIOGRAPHY HEAD AND NECK TECHNIQUE: Multidetector CT imaging of the head and neck was performed using the standard protocol during bolus administration of intravenous contrast. Multiplanar CT image  reconstructions and MIPs were obtained to evaluate the vascular anatomy. Carotid stenosis measurements (when applicable) are obtained utilizing NASCET criteria, using the distal internal carotid diameter as the denominator. CONTRAST:  78mL OMNIPAQUE IOHEXOL 350 MG/ML SOLN COMPARISON:  Head CT from earlier the same day FINDINGS: CTA NECK FINDINGS Aortic arch: 3 vessel branching.  No acute finding or dilatation. Right carotid system: Vessels are smooth and widely patent with no noted atheromatous changes. Left carotid system: Vessels are smooth and widely patent with mild atheromatous changes at the bifurcation. Vertebral arteries: No proximal subclavian stenosis. The vertebral arteries are smooth and widely patent. Skeleton: Few remaining teeth with osteitis around the carious right lower molar. Isolated sclerotic focus in the clivus which has an overall benign appearance. There is multilevel degenerative disc narrowing and ridging. Other neck: Negative Upper chest: Airway thickening at the apices. Review of the MIP images confirms the above findings CTA HEAD FINDINGS Anterior circulation: Mild atheromatous narrowing and calcification of bilateral carotid siphons. Branch vessel atheromatous type irregularity, most notably affecting the right M1 segment where there is moderate stenosis that is most prominent on coronal reformats. 1 mm outpouchings of the bilateral ophthalmic ICA. Posterior circulation: The vertebral and basilar arteries are smooth and widely patent. Moderate right P2 segment narrowing. Atheromatous irregularity of bilateral PCA branches. Negative for aneurysm Venous sinuses: Patent Anatomic variants: None unusual Review of the MIP images confirms the above findings IMPRESSION: 1. No emergent finding. 2. Primarily intracranial atherosclerosis most notably causing right M1 and right P2 moderate narrowings. 3. 1 mm outpouchings at the bilateral ophthalmic ICA which could be infundibulum or early  aneurysm. Electronically Signed   By: Marnee Spring M.D.   On: 10/15/2020 04:49    Procedures Procedures  CRITICAL CARE Performed by: Dione Booze Total critical care time: 55 minutes Critical care time was exclusive of separately billable procedures and treating other patients. Critical care was necessary to treat or prevent imminent or life-threatening deterioration. Critical care was time spent personally by me on the following activities: development of treatment plan with patient and/or surrogate as well as nursing, discussions with consultants, evaluation of patient's response to treatment, examination of patient, obtaining history from patient or surrogate, ordering and performing treatments and interventions, ordering and review of laboratory studies, ordering and review of radiographic studies, pulse oximetry and re-evaluation of patient's condition.  Medications Ordered in ED Medications  aspirin chewable tablet 81 mg (81 mg Oral Given 10/15/20 0501)  clopidogrel (PLAVIX) tablet 75 mg ( Oral Canceled Entry 10/15/20 1000)  sodium chloride flush (NS) 0.9 % injection 3 mL (3 mLs Intravenous Given 10/15/20 0423)  iohexol (OMNIPAQUE) 350 MG/ML injection 80 mL (80 mLs Intravenous Contrast Given 10/15/20 0436)  potassium chloride SA (KLOR-CON) CR tablet 40 mEq (40 mEq Oral Given 10/15/20 0601)    ED Course  I have reviewed the triage vital  signs and the nursing notes.  Pertinent labs & imaging results that were available during my care of the patient were reviewed by me and considered in my medical decision making (see chart for details).  MDM Rules/Calculators/A&P Increased left leg weakness concerning for new stroke.  Old records are reviewed, and the only records we have are from COVID-19 immunization in April.  Code stroke is activated although he is outside of the window for thrombolytic therapy.  CT of head shows old stroke, CT angiogram is ordered.  CT angiogram shows no acute  occlusion.  MRI is ordered.  Labs show renal insufficiency and hypokalemia.  He is given a dose of oral potassium.  Case is discussed with Dr. Toniann FailKakrakandy of [facility], who agrees to admit the patient.  Final Clinical Impression(s) / ED Diagnoses Final diagnoses:  Cerebrovascular accident (CVA), unspecified mechanism (HCC)  Renal insufficiency  Hypokalemia    Rx / DC Orders ED Discharge Orders    None       Dione BoozeGlick, Bernis Stecher, MD 10/15/20 [phone]

## 2020-10-15 NOTE — ED Triage Notes (Signed)
Pt comes via [facility], LSN 3am, pt has a hx of stroke in the past with L sided weakness residual, pt had a fall and was unable to get out of the floor, did not hit head no LOC, pt has increased weakness in the L leg and unable to lift it or bear weight.

## 2020-10-15 NOTE — Plan of Care (Signed)
  Problem: Education: Goal: Knowledge of disease or condition will improve Outcome: Progressing Goal: Knowledge of secondary prevention will improve Outcome: Progressing Goal: Knowledge of patient specific risk factors addressed and post discharge goals established will improve Outcome: Progressing   Problem: Self-Care: Goal: Ability to participate in self-care as condition permits will improve Outcome: Progressing   Problem: Self-Care: Goal: Ability to participate in self-care as condition permits will improve Outcome: Progressing

## 2020-10-15 NOTE — ED Notes (Signed)
Lunch Tray Ordered @ 1034. 

## 2020-10-15 NOTE — ED Notes (Addendum)
Notified admitting MD about pt's BP being elevated. MD notified this RN that we will allow for permissive htn up to 220/120.

## 2020-10-15 NOTE — Progress Notes (Signed)
  Echocardiogram 2D Echocardiogram has been performed.  Stark Bray Swaim 10/15/2020, 11:46 AM

## 2020-10-16 ENCOUNTER — Other Ambulatory Visit: Payer: Self-pay

## 2020-10-16 DIAGNOSIS — I10 Essential (primary) hypertension: Secondary | ICD-10-CM

## 2020-10-16 LAB — LIPID PANEL
Cholesterol: 133 mg/dL (ref 0–200)
HDL: 46 mg/dL (ref >40)
LDL Cholesterol: 75 mg/dL (ref 0–99)
Total CHOL/HDL Ratio: 2.9 RATIO
Triglycerides: 62 mg/dL (ref <150)
VLDL: 12 mg/dL (ref 0–40)

## 2020-10-16 LAB — GLUCOSE, CAPILLARY
Glucose-Capillary: 106 mg/dL — ABNORMAL HIGH (ref 70–99)
Glucose-Capillary: 135 mg/dL — ABNORMAL HIGH (ref 70–99)
Glucose-Capillary: 125 mg/dL — ABNORMAL HIGH (ref 70–99)
Glucose-Capillary: 109 mg/dL — ABNORMAL HIGH (ref 70–99)

## 2020-10-16 MED ORDER — INFLUENZA VAC SPLIT QUAD 0.5 ML IM SUSY
0.5000 mL | PREFILLED_SYRINGE | INTRAMUSCULAR | Status: AC
  Administered 2020-10-17: 0.5 mL via INTRAMUSCULAR
  Filled 2020-10-16: qty 0.5

## 2020-10-16 MED ORDER — LOSARTAN POTASSIUM 50 MG PO TABS
100.0000 mg | ORAL_TABLET | Freq: Every day | ORAL | Status: DC
  Administered 2020-10-17: 100 mg via ORAL
  Filled 2020-10-16: qty 2

## 2020-10-16 MED ORDER — LOSARTAN POTASSIUM 50 MG PO TABS
50.0000 mg | ORAL_TABLET | Freq: Once | ORAL | Status: AC
  Administered 2020-10-16: 50 mg via ORAL
  Filled 2020-10-16: qty 1

## 2020-10-16 MED ORDER — LOSARTAN POTASSIUM 50 MG PO TABS
50.0000 mg | ORAL_TABLET | Freq: Every day | ORAL | Status: DC
  Administered 2020-10-16: 50 mg via ORAL
  Filled 2020-10-16: qty 1

## 2020-10-16 MED ORDER — LABETALOL HCL 5 MG/ML IV SOLN: 10.0000 mg | INTRAVENOUS | Status: DC | PRN

## 2020-10-16 MED ORDER — HYDRALAZINE HCL 25 MG PO TABS
25.0000 mg | ORAL_TABLET | ORAL | Status: DC | PRN
  Administered 2020-10-16 – 2020-10-17 (×2): 25 mg via ORAL
  Filled 2020-10-16 (×2): qty 1

## 2020-10-16 NOTE — Evaluation (Signed)
Speech Language Pathology Evaluation Patient Details Name: Calvin Jackson MRN: [id number] DOB: Apr 23, 1961 Today's Date: 10/16/2020 Time: 9379-0240 SLP Time Calculation (min) (ACUTE ONLY): 18 min  Problem List:  Patient Active Problem List   Diagnosis Date Noted  . Acute CVA (cerebrovascular accident) (HCC) 10/15/2020  . Marijuana abuse 10/15/2020  . Tobacco dependence 10/15/2020  . Essential hypertension   . Dyslipidemia   . Diabetes mellitus with neurological manifestation [facility])    Past Medical History:  Past Medical History:  Diagnosis Date  . Diabetes mellitus with neurological manifestation (HCC)   . Dyslipidemia   . Essential hypertension   . Marijuana abuse 10/15/2020  . Stroke (HCC)   . Tobacco dependence 10/15/2020   Past Surgical History: History reviewed. No pertinent surgical history. HPI:   59 y.o. male with medical history significant for DM; HTN; HLD; and prior CVA (deficit with dragging left leg) presenting with L-sided weakness.  He reports that he was cleaning potatoes when his his knees buckled.  He noticed increasing weakness of his left arm and leg.  He denies issues with speech/swallow.  He reports having had a CVA about 2-3 years ago, and was cared for at [facility] in [place].  MRI scan shows right temporoparietal periventricular and right corona radiata infarcts.     Assessment / Plan / Recommendation Clinical Impression  Pt presents with mild deficits in cognition, specific to spatial construction tasks, awareness of deficits, and divergent problem solving.  He had significant difficulty with clock drawing task with inaccurate placement of numbers, repetition of numbers and limited recognition.  Working [employer] fast streamer, attention, and orientation were [facility]. He scored a 23/30 per the [employer] (normal range for <hs education is 25-30).  Recommend HH SLP f/u; frequent if not full supervision upon D/C, at least initially.      SLP Assessment  SLP  Recommendation/Assessment: Patient needs continued Speech Lanaguage Pathology Services [facility] SLP Visit Diagnosis: Cognitive communication deficit (R41.841)    Follow Up Recommendations  Home health SLP    Frequency and Duration   n/a        SLP Evaluation Cognition  Overall Cognitive Status: Impaired/Different from baseline Arousal/Alertness: Awake/alert Orientation Level: Oriented X4 Attention: Selective Selective Attention: Appears intact Memory: Appears intact Awareness: Impaired Awareness Impairment: Intellectual impairment Problem Solving: Impaired Problem Solving Impairment: Verbal basic Safety/Judgment: Impaired       Comprehension  Auditory Comprehension Overall Auditory Comprehension: Appears within functional limits for tasks assessed Reading Comprehension Reading Status: Not tested    Expression Expression Primary Mode of Expression: Verbal Verbal Expression Overall Verbal Expression: Appears within functional limits for tasks assessed Written Expression Dominant Hand: Right   Oral / Motor  Oral Motor/Sensory Function Overall Oral Motor/Sensory Function: Mild impairment (left lower face) Motor Speech Overall Motor Speech: Appears within functional limits for tasks assessed   GO                    [name] 10/16/2020, 3:04 PM   Aleana Fifita L. Samson Frederic, MA CCC/SLP Acute Rehabilitation Services Office number [phone] Pager [phone]

## 2020-10-16 NOTE — Hospital Course (Addendum)
Calvin Jackson is a 59 yo male with PMH DMII, HTN, HLD, prior CVA who presented with left sided weakness. He stated his previous stroke also affected his left side and he does not have residual deficits.  He underwent MRI brain which showed acute right temporoparietal infarct, right corona radiata (possibly involving right caudate nucleus), and a few small subtle cortical infarcts involving the right parietal lobe.  He was evaluated by neurology and recommended for aspirin and Plavix for 3 months then aspirin alone. He was evaluated by PT/OT with recommendations for home health at discharge.

## 2020-10-16 NOTE — Assessment & Plan Note (Addendum)
-   presented with left side weakness He underwent MRI brain which showed acute right temporoparietal infarct, right corona radiata (possibly involving right caudate nucleus), and a few small subtle cortical infarcts involving the right parietal lobe.  - follow up PT/OT: home heath at d/c - per neuro, DAPT x 3 months followed by asa - LDL 75, continue lipitor

## 2020-10-16 NOTE — Assessment & Plan Note (Signed)
continue lipitor ?

## 2020-10-16 NOTE — Progress Notes (Signed)
PROGRESS NOTE    Calvin Jackson   WNI:[id number]  DOB: 07/17/1961  DOA: 10/15/2020     1  PCP: No primary care provider on file.  CC: left side weakness  Hospital Course: Mr. Xue is a 59 yo male with PMH DMII, HTN, HLD, prior CVA who presented with left sided weakness. He stated his previous stroke also affected his left side and he does not have residual deficits.  He underwent MRI brain which showed acute right temporoparietal infarct, right corona radiata (possibly involving right caudate nucleus), and a few small subtle cortical infarcts involving the right parietal lobe.    Interval History:  Feeling somewhat better this morning. Was a little surprised and upset about new stroke diagnosis. We discussed risk factor modification such as continuing statin, starting asa, and quitting smoking, etc.   Old records reviewed in assessment of this patient  ROS: Constitutional: negative for chills and fevers, Respiratory: negative for cough, Cardiovascular: negative for chest pain and Gastrointestinal: negative for abdominal pain  Assessment & Plan: * Acute CVA (cerebrovascular accident) (HCC) - presented with left side weakness He underwent MRI brain which showed acute right temporoparietal infarct, right corona radiata (possibly involving right caudate nucleus), and a few small subtle cortical infarcts involving the right parietal lobe.  - follow up PT/OT - per neuro, DAPT x 3 weeks followed by asa - LDL 75, continue lipitor   Tobacco dependence - cessation counseling given   Marijuana abuse - cessation again recommended   Diabetes mellitus with neurological manifestation (HCC) - A1c 5.9% - resume home meds at discharge - SSI while inpatient   Dyslipidemia - continue lipitor   Essential hypertension - permissive HTN - will gradually resume some home med regimen    Antimicrobials: none  DVT prophylaxis: Lovenox  Code Status: FULL Family Communication: none  present Disposition Plan: Status is: Inpatient  Remains inpatient appropriate because:Unsafe d/c plan and Inpatient level of care appropriate due to severity of illness   Dispo: The patient is from: Home              Anticipated d/c is to: pending PT evals              Anticipated d/c date is: 1 day              Patient currently is not medically stable to d/c.       Objective: Blood pressure (!) 190/94, pulse 60, temperature 98.3 F (36.8 C), temperature source Oral, resp. rate 16, height 5\' 10"  (1.778 m), weight 83.9 kg, SpO2 99 %.  Examination: General appearance: alert, cooperative and no distress Head: Normocephalic, without obvious abnormality, atraumatic Eyes: EOMI Lungs: clear to auscultation bilaterally Heart: regular rate and rhythm and S1, S2 normal Abdomen: normal findings: bowel sounds normal and soft, non-tender Extremities: no edema Skin: mobility and turgor normal Neurologic: Grossly normal  Consultants:   neuro  Procedures:   none  Data Reviewed: I have personally reviewed following labs and imaging studies Results for orders placed or performed during the hospital encounter of 10/15/20 (from the past 24 hour(s))  Glucose, capillary     Status: Abnormal   Collection Time: 10/15/20  6:11 PM  Result Value Ref Range   Glucose-Capillary 163 (H) 70 - 99 mg/dL  Glucose, capillary     Status: Abnormal   Collection Time: 10/15/20  9:08 PM  Result Value Ref Range   Glucose-Capillary 119 (H) 70 - 99 mg/dL   Comment 1 Notify RN  Comment 2 Document in Chart   Lipid panel     Status: None   Collection Time: 10/16/20  3:01 AM  Result Value Ref Range   Cholesterol 133 0 - 200 mg/dL   Triglycerides 62 <161<150 mg/dL   HDL 46 >09>40 mg/dL   Total CHOL/HDL Ratio 2.9 RATIO   VLDL 12 0 - 40 mg/dL   LDL Cholesterol 75 0 - 99 mg/dL  Glucose, capillary     Status: Abnormal   Collection Time: 10/16/20  6:09 AM  Result Value Ref Range   Glucose-Capillary 106 (H) 70 -  99 mg/dL   Comment 1 Notify RN    Comment 2 Document in Chart   Glucose, capillary     Status: Abnormal   Collection Time: 10/16/20 11:56 AM  Result Value Ref Range   Glucose-Capillary 135 (H) 70 - 99 mg/dL    Recent Results (from the past 240 hour(s))  Respiratory Panel by RT PCR (Flu A&B, Covid) - Nasopharyngeal Swab     Status: None   Collection Time: 10/15/20  5:51 AM   Specimen: Nasopharyngeal Swab  Result Value Ref Range Status   SARS Coronavirus 2 by RT PCR NEGATIVE NEGATIVE Final    Comment: (NOTE) SARS-CoV-2 target nucleic acids are NOT DETECTED.  The SARS-CoV-2 RNA is generally detectable in upper respiratoy specimens during the acute phase of infection. The lowest concentration of SARS-CoV-2 viral copies this assay can detect is 131 copies/mL. A negative result does not preclude SARS-Cov-2 infection and should not be used as the sole basis for treatment or other patient management decisions. A negative result may occur with  improper specimen collection/handling, submission of specimen other than nasopharyngeal swab, presence of viral mutation(s) within the areas targeted by this assay, and inadequate number of viral copies (<131 copies/mL). A negative result must be combined with clinical observations, patient history, and epidemiological information. The expected result is Negative.  Fact Sheet for Patients:  https://www.moore.com/https://www.fda.gov/media/142436/download  Fact Sheet for Healthcare Providers:  https://www.young.biz/https://www.fda.gov/media/142435/download  This test is no t yet approved or cleared by the [place] FDA and  has been authorized for detection and/or diagnosis of SARS-CoV-2 by FDA under an Emergency Use Authorization (EUA). This EUA will remain  in effect (meaning this test can be used) for the duration of the COVID-19 declaration under Section 564(b)(1) of the Act, 21 U.S.C. section 360bbb-3(b)(1), unless the authorization is terminated or revoked sooner.      Influenza A by PCR NEGATIVE NEGATIVE Final   Influenza B by PCR NEGATIVE NEGATIVE Final    Comment: (NOTE) The Xpert Xpress SARS-CoV-2/FLU/RSV assay is intended as an aid in  the diagnosis of influenza from Nasopharyngeal swab specimens and  should not be used as a sole basis for treatment. Nasal washings and  aspirates are unacceptable for Xpert Xpress SARS-CoV-2/FLU/RSV  testing.  Fact Sheet for Patients: https://www.moore.com/https://www.fda.gov/media/142436/download  Fact Sheet for Healthcare Providers: https://www.young.biz/https://www.fda.gov/media/142435/download  This test is not yet approved or cleared by the [place] FDA and  has been authorized for detection and/or diagnosis of SARS-CoV-2 by  FDA under an Emergency Use Authorization (EUA). This EUA will remain  in effect (meaning this test can be used) for the duration of the  Covid-19 declaration under Section 564(b)(1) of the Act, 21  U.S.C. section 360bbb-3(b)(1), unless the authorization is  terminated or revoked. Performed at [facility], [address] [place], [place] [address]      Radiology Studies: MR BRAIN WO CONTRAST  Result  Date: 10/15/2020 CLINICAL DATA:  Neuro deficit, acute, stroke suspected. EXAM: MRI HEAD WITHOUT CONTRAST TECHNIQUE: Multiplanar, multiecho pulse sequences of the brain and surrounding structures were obtained without intravenous contrast. COMPARISON:  Noncontrast head CT and CT angiogram head/neck 10/15/2020. FINDINGS: Brain: Intermittent motion degradation. Most notably, there is moderate motion degradation of the coronal T2 weighted sequence. 0.7 x 1.3 x 0.5 cm focus of restricted diffusion within the right temporoparietal periventricular white matter consistent with acute infarction (series 5, image 76). 2.1 x 0.8 x 0.9 cm focus of restricted diffusion within the right corona radiata, possibly also involving portions of the adjacent caudate nucleus, compatible with acute infarction (series 5, image 80). A few small  subtle acute cortical infarcts are also suspected within the right parietal lobe (for instance as seen on series 5, image 79) (series 7, image 45). Chronic cortical/subcortical infarct within the mid to anterior and lateral right frontal lobe/right frontal operculum. This infarct extends to the right insula anteriorly Chronic subcortical infarcts are also present within the bilateral cerebral white matter, basal ganglia and thalami. Small chronic infarcts are present within the bilateral cerebellar hemispheres. Severe scattered and confluent T2/FLAIR hyperintensity within the cerebral white matter is nonspecific, but compatible chronic small vessel ischemic disease. To a lesser degree, chronic small vessel ischemic changes are also present within the pons. Mild SWI signal loss within the bilateral basal ganglia which may reflect chronic blood products or mineralization. No evidence of intracranial mass. No extra-axial fluid collection. No midline shift. Vascular: Expected proximal arterial flow voids. Skull and upper cervical spine: No focal marrow lesion. Cervical spondylosis. Sinuses/Orbits: Visualized orbits show no acute finding. Mild ethmoid and left maxillary sinus mucosal thickening. Small right maxillary sinus mucous retention cyst. Polypoid soft tissue within the anterior left nasal passage. IMPRESSION: Motion degraded examination. 1.3 cm acute infarct within the right temporoparietal periventricular white matter. 2.1 cm acute infarct within the right corona radiata, possibly also involving portions of the adjacent right caudate nucleus. A few small subtle acute cortical infarcts are also suspected within the right parietal lobe. Severe chronic small vessel ischemic disease with remote supratentorial cortical and subcortical infarcts, and remote bilateral cerebellar infarcts. Paranasal sinus disease as described. Electronically Signed   By: Jackey Loge DO   On: 10/15/2020 07:56   ECHOCARDIOGRAM  COMPLETE  Result Date: 10/15/2020    ECHOCARDIOGRAM REPORT   Patient Name:   Calvin Jackson Date of Exam: 10/15/2020 Medical Rec #:  [id number]      Height: Accession #:    [id number]     Weight: Date of Birth:  1961/05/05      BSA: Patient Age:    59 years       BP:           174/86 mmHg Patient Gender: M              HR:           60 bpm. Exam Location:  Inpatient Procedure: 2D Echo, Cardiac Doppler and Color Doppler Indications:   Stroke 434.91 / I163.9  History:       Patient has no prior history of Echocardiogram examinations.                Stroke.  Sonographer:   Renella Cunas RDCS Referring      [id number] Phys: IMPRESSIONS  1. Left ventricular ejection fraction, by estimation, is 55 to 60%. The left ventricle has normal function. The left ventricle demonstrates regional wall motion  abnormalities with basal inferior hypokinesis. There is mild left ventricular hypertrophy. Left ventricular diastolic parameters are consistent with Grade I diastolic dysfunction (impaired relaxation).  2. Right ventricular systolic function is normal. The right ventricular size is normal. Tricuspid regurgitation signal is inadequate for assessing PA pressure.  3. The mitral valve is normal in structure. No evidence of mitral valve regurgitation. No evidence of mitral stenosis.  4. The aortic valve is grossly normal. Aortic valve regurgitation is not visualized. No aortic stenosis is present.  5. The inferior vena cava is normal in size with greater than 50% respiratory variability, suggesting right atrial pressure of 3 mmHg. FINDINGS  Left Ventricle: Left ventricular ejection fraction, by estimation, is 55 to 60%. The left ventricle has normal function. The left ventricle demonstrates regional wall motion abnormalities. The left ventricular internal cavity size was normal in size. There is mild left ventricular hypertrophy. Left ventricular diastolic parameters are consistent with Grade I diastolic dysfunction (impaired  relaxation). Right Ventricle: The right ventricular size is normal. No increase in right ventricular wall thickness. Right ventricular systolic function is normal. Tricuspid regurgitation signal is inadequate for assessing PA pressure. Left Atrium: Left atrial size was normal in size. Right Atrium: Right atrial size was normal in size. Pericardium: There is no evidence of pericardial effusion. Mitral Valve: The mitral valve is normal in structure. No evidence of mitral valve regurgitation. No evidence of mitral valve stenosis. Tricuspid Valve: The tricuspid valve is normal in structure. Tricuspid valve regurgitation is trivial. Aortic Valve: The aortic valve is grossly normal. Aortic valve regurgitation is not visualized. No aortic stenosis is present. Pulmonic Valve: The pulmonic valve was normal in structure. Pulmonic valve regurgitation is not visualized. Aorta: The aortic root is normal in size and structure. Venous: The inferior vena cava is normal in size with greater than 50% respiratory variability, suggesting right atrial pressure of 3 mmHg. IAS/Shunts: No atrial level shunt detected by color flow Doppler.  LEFT VENTRICLE PLAX 2D LVIDd:         4.60 cm      Diastology LVIDs:         3.30 cm      LV e' medial:    6.73 cm/s LV PW:         0.90 cm      LV E/e' medial:  11.6 LV IVS:        0.90 cm      LV e' lateral:   9.00 cm/s LVOT diam:     2.10 cm      LV E/e' lateral: 8.7 LV SV:         71 LVOT Area:     3.46 cm  LV Volumes (MOD) LV vol d, MOD A2C: 102.0 ml LV vol d, MOD A4C: 104.0 ml LV vol s, MOD A2C: 42.0 ml LV vol s, MOD A4C: 48.1 ml LV SV MOD A2C:     60.0 ml LV SV MOD A4C:     104.0 ml LV SV MOD BP:      58.2 ml RIGHT VENTRICLE RV S prime:     14.70 cm/s TAPSE (M-mode): 2.5 cm LEFT ATRIUM             RIGHT ATRIUM LA diam:        3.60 cm RA Area:     16.60 cm LA Vol (A2C):   37.8 ml RA Volume:   43.10 ml LA Vol (A4C):   27.7 ml LA Biplane Vol: 32.5 ml  AORTIC VALVE LVOT Vmax:   102.00 cm/s LVOT  Vmean:  60.600 cm/s LVOT VTI:    0.204 m  AORTA Ao Root diam: 3.00 cm MITRAL VALVE MV Area (PHT): 3.42 cm    SHUNTS MV Decel Time: 222 msec    Systemic VTI:  0.20 m MV E velocity: 78.40 cm/s  Systemic Diam: 2.10 cm MV A velocity: 74.60 cm/s MV E/A ratio:  1.05 Marca Ancona MD Electronically signed by Marca Ancona MD Signature Date/Time: 10/15/2020/2:43:59 PM    Final    CT HEAD CODE STROKE WO CONTRAST  Result Date: 10/15/2020 CLINICAL DATA:  Code stroke.  Left leg drift EXAM: CT HEAD WITHOUT CONTRAST TECHNIQUE: Contiguous axial images were obtained from the base of the skull through the vertex without intravenous contrast. COMPARISON:  None. FINDINGS: Brain: No acute cortical infarct, hemorrhage, hydrocephalus, or masslike finding. Numerous remote subcortical infarcts, lacunar and perforator, traversing the basal ganglia and deep white matter tracks. Chronic small vessel ischemia in the cerebral white matter that is confluent. There has been prior patchy cortical infarction in the anterior division right MCA territory. Small remote right cerebellar infarcts. Vascular: Mid basilar appears high-density on axial slices, likely from streak artifact based on reformats. Skull: Nonspecific sclerosis in the clivus, the overall benign appearing margins. Sinuses/Orbits: Mucosal thickening in the paranasal sinuses and nasal cavity. Other: These results were called by telephone at the time of interpretation on 10/15/2020 at 4:25 am to provider Dr Derry Lory who verbally acknowledged these results. ASPECTS [facility] Stroke Program Early CT Score) Not scored given the extent of chronic changes. IMPRESSION: 1. Advanced chronic small vessel disease with numerous remote cortical and subcortical infarcts. 2. Intermittently high-density basilar which is likely from streak artifact. CTA is pending. Electronically Signed   By: Marnee Spring M.D.   On: 10/15/2020 04:30   CT ANGIO HEAD CODE STROKE  Result Date:  10/15/2020 CLINICAL DATA:  Left leg drift.  History of stroke EXAM: CT ANGIOGRAPHY HEAD AND NECK TECHNIQUE: Multidetector CT imaging of the head and neck was performed using the standard protocol during bolus administration of intravenous contrast. Multiplanar CT image reconstructions and MIPs were obtained to evaluate the vascular anatomy. Carotid stenosis measurements (when applicable) are obtained utilizing NASCET criteria, using the distal internal carotid diameter as the denominator. CONTRAST:  85mL OMNIPAQUE IOHEXOL 350 MG/ML SOLN COMPARISON:  Head CT from earlier the same day FINDINGS: CTA NECK FINDINGS Aortic arch: 3 vessel branching.  No acute finding or dilatation. Right carotid system: Vessels are smooth and widely patent with no noted atheromatous changes. Left carotid system: Vessels are smooth and widely patent with mild atheromatous changes at the bifurcation. Vertebral arteries: No proximal subclavian stenosis. The vertebral arteries are smooth and widely patent. Skeleton: Few remaining teeth with osteitis around the carious right lower molar. Isolated sclerotic focus in the clivus which has an overall benign appearance. There is multilevel degenerative disc narrowing and ridging. Other neck: Negative Upper chest: Airway thickening at the apices. Review of the MIP images confirms the above findings CTA HEAD FINDINGS Anterior circulation: Mild atheromatous narrowing and calcification of bilateral carotid siphons. Branch vessel atheromatous type irregularity, most notably affecting the right M1 segment where there is moderate stenosis that is most prominent on coronal reformats. 1 mm outpouchings of the bilateral ophthalmic ICA. Posterior circulation: The vertebral and basilar arteries are smooth and widely patent. Moderate right P2 segment narrowing. Atheromatous irregularity of bilateral PCA branches. Negative for aneurysm Venous sinuses: Patent Anatomic variants: None unusual  Review of the MIP  images confirms the above findings IMPRESSION: 1. No emergent finding. 2. Primarily intracranial atherosclerosis most notably causing right M1 and right P2 moderate narrowings. 3. 1 mm outpouchings at the bilateral ophthalmic ICA which could be infundibulum or early aneurysm. Electronically Signed   By: Marnee Spring M.D.   On: 10/15/2020 04:49   CT ANGIO NECK CODE STROKE  Result Date: 10/15/2020 CLINICAL DATA:  Left leg drift.  History of stroke EXAM: CT ANGIOGRAPHY HEAD AND NECK TECHNIQUE: Multidetector CT imaging of the head and neck was performed using the standard protocol during bolus administration of intravenous contrast. Multiplanar CT image reconstructions and MIPs were obtained to evaluate the vascular anatomy. Carotid stenosis measurements (when applicable) are obtained utilizing NASCET criteria, using the distal internal carotid diameter as the denominator. CONTRAST:  28mL OMNIPAQUE IOHEXOL 350 MG/ML SOLN COMPARISON:  Head CT from earlier the same day FINDINGS: CTA NECK FINDINGS Aortic arch: 3 vessel branching.  No acute finding or dilatation. Right carotid system: Vessels are smooth and widely patent with no noted atheromatous changes. Left carotid system: Vessels are smooth and widely patent with mild atheromatous changes at the bifurcation. Vertebral arteries: No proximal subclavian stenosis. The vertebral arteries are smooth and widely patent. Skeleton: Few remaining teeth with osteitis around the carious right lower molar. Isolated sclerotic focus in the clivus which has an overall benign appearance. There is multilevel degenerative disc narrowing and ridging. Other neck: Negative Upper chest: Airway thickening at the apices. Review of the MIP images confirms the above findings CTA HEAD FINDINGS Anterior circulation: Mild atheromatous narrowing and calcification of bilateral carotid siphons. Branch vessel atheromatous type irregularity, most notably affecting the right M1 segment where there  is moderate stenosis that is most prominent on coronal reformats. 1 mm outpouchings of the bilateral ophthalmic ICA. Posterior circulation: The vertebral and basilar arteries are smooth and widely patent. Moderate right P2 segment narrowing. Atheromatous irregularity of bilateral PCA branches. Negative for aneurysm Venous sinuses: Patent Anatomic variants: None unusual Review of the MIP images confirms the above findings IMPRESSION: 1. No emergent finding. 2. Primarily intracranial atherosclerosis most notably causing right M1 and right P2 moderate narrowings. 3. 1 mm outpouchings at the bilateral ophthalmic ICA which could be infundibulum or early aneurysm. Electronically Signed   By: Marnee Spring M.D.   On: 10/15/2020 04:49   MR BRAIN WO CONTRAST  Final Result    CT ANGIO HEAD CODE STROKE  Final Result    CT ANGIO NECK CODE STROKE  Final Result    CT HEAD CODE STROKE WO CONTRAST  Final Result      Scheduled Meds: . aspirin  81 mg Oral Daily  . atorvastatin  40 mg Oral Daily  . clopidogrel  75 mg Oral Daily  . enoxaparin (LOVENOX) injection  40 mg Subcutaneous Q24H  . famotidine  10 mg Oral BID  . insulin aspart  0-15 Units Subcutaneous TID WC  . insulin aspart  0-5 Units Subcutaneous QHS  . losartan  50 mg Oral Daily  . tamsulosin  0.4 mg Oral QHS   PRN Meds: acetaminophen **OR** acetaminophen (TYLENOL) oral liquid 160 mg/5 mL **OR** acetaminophen, ondansetron (ZOFRAN) IV, senna-docusate Continuous Infusions: . sodium chloride 50 mL/hr at 10/16/20 1404     LOS: 1 day  Time spent: Greater than 50% of the 35 minute visit was spent in counseling/coordination of care for the patient as laid out in the A&P.   Lewie Chamber, MD [facility] 10/16/2020,  2:22 PM

## 2020-10-16 NOTE — Evaluation (Signed)
Physical Therapy Evaluation Patient Details Name: Calvin Jackson MRN: [id number] DOB: [birth year] Today's Date: 10/16/2020   History of Present Illness  Pt is a 59 yo male presenting with worsening L side weakness. Pt did not receive tPA. NIHSS 0. MRI revealed R temporoparietal infarct, R corona radiata infarct, few acute cortical infarcts also suspected within R parietal lobe, remote supratentorial cortical and subcortical infarcts, and remove bilateral cerebellar infarcts. CTA bilateral ophthalmic ICA 49mm infundibulum vs early aneurysm. 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  Clinical Impression  The patient demonstrates L sided weakness, which he reports is residual from his previous stroke. However, he demonstrates increased difficulty with gait and balance, placing him at risk for falls. This is supported by his DGI score of 12 this date. He ambulates at a slow pace with increased concentration and decreased L LE step length. He required min guard assist to perform transfers, ambulate with a RW, and negotiate stairs with both hand rails. He only required supervision for bed mobility. However, he would display bouts of LOB when he would attempt to shift his weight with no UE support, resulting in him requiring min-modA to recover. He does have limited support at home, but is capable of performing mobility safely when utilizing rails or a RW. Will recommend HH PT and a RW and 3-in-1 commode to maximize safety at home. Will continue to follow acutely.    Follow Up Recommendations Home health PT;Supervision for mobility/OOB    Equipment Recommendations  Rolling walker with 5" wheels;3in1 (PT)    Recommendations for Other Services       Precautions / Restrictions Precautions Precautions: Fall Restrictions Weight Bearing Restrictions: No      Mobility  Bed Mobility Overal bed mobility: Needs Assistance Bed Mobility: Supine to  Sit     Supine to sit: Supervision     General bed mobility comments: Extra time and several rocking attempts to gain momentum to ascend trunk, supervision for safety.    Transfers Overall transfer level: Needs assistance Equipment used: Rolling walker (2 wheeled) Transfers: Sit to/from Stand Sit to Stand: Min guard         General transfer comment: Min guard assist for safety due to noted unsteadiness and extra time required to power up to stand.  Ambulation/Gait Ambulation/Gait assistance: Min guard Gait Distance (Feet): 225 Feet Assistive device: Rolling walker (2 wheeled) Gait Pattern/deviations: Step-through pattern;Decreased step length - left;Decreased stride length;Trunk flexed Gait velocity: decreased Gait velocity interpretation: <1.31 ft/sec, indicative of household ambulator General Gait Details: Pt ambulates at slow pace with purposeful mechnics, demonstrating decreased step length on L but improved when cued. Unsteadiness noted but no overt LOB when utilizing RW.l  Stairs Stairs: Yes Stairs assistance: Min guard Stair Management: Two rails;Step to pattern Number of Stairs: 2 General stair comments: Pt ascends and descends stairs with both hand rails and step-to gait pattern, cuing pt to lead up with R and down with L. Min guard for safety, no overt LOB.  Wheelchair Mobility    Modified Rankin (Stroke Patients Only) Modified Rankin (Stroke Patients Only) Pre-Morbid Rankin Score: No significant disability Modified Rankin: Moderately severe disability     Balance Overall balance assessment: Needs assistance Sitting-balance support: No upper extremity supported;Feet supported Sitting balance-Leahy Scale: Fair Sitting balance - Comments: Static sitting wihtout LOB.   Standing balance support: No upper extremity supported Standing balance-Leahy Scale: Fair Standing balance comment: Pt is able to  maintain static standing balance without UE support  intermittently, but displays LOB when he moves without UE support resulting in min-modA to recover.                 Standardized Balance Assessment Standardized Balance Assessment : Dynamic Gait Index   Dynamic Gait Index Level Surface: Moderate Impairment Change in Gait Speed: Moderate Impairment Gait with Horizontal Head Turns: Mild Impairment Gait with Vertical Head Turns: Mild Impairment Gait and Pivot Turn: Moderate Impairment Step Over Obstacle: Mild Impairment Step Around Obstacles: Mild Impairment Steps: Moderate Impairment Total Score: 12       Pertinent Vitals/Pain Pain Assessment: No/denies pain    Home Living Family/patient expects to be discharged to:: Private residence Living Arrangements: Other relatives (sister) Available Help at Discharge: Available PRN/intermittently;Family (sister (works M-F 6am-5pm)) Type of Home: House Home Access: Stairs to enter Entrance Stairs-Rails: Can reach both Secretary/administrator of Steps: 4 Home Layout: One level Home Equipment: None      Prior Function Level of Independence: Independent         Comments: Pt works at [employer] full-time. Pt does not drive, he walks or takes public transportation.      Hand Dominance   Dominant Hand: Right    Extremity/Trunk Assessment   Upper Extremity Assessment Upper Extremity Assessment: Defer to OT evaluation    Lower Extremity Assessment Lower Extremity Assessment: RLE deficits/detail;LLE deficits/detail RLE Deficits / Details: MMT scores of 4+ to 5 grossly throughout RLE Sensation: WNL RLE Coordination: WNL LLE Deficits / Details: MMT score of 3+ with hip flexion and 4 to 4+ grossly throughout rest of leg LLE Sensation: WNL LLE Coordination: WNL    Cervical / Trunk Assessment Cervical / Trunk Assessment: Normal  Communication   Communication: No difficulties  Cognition Arousal/Alertness: Awake/alert Behavior During Therapy: WFL for tasks  assessed/performed Overall Cognitive Status: Impaired/Different from baseline Area of Impairment: Following commands;Safety/judgement                       Following Commands: Follows one step commands consistently;Follows one step commands with increased time;Follows multi-step commands inconsistently Safety/Judgement: Decreased awareness of safety;Decreased awareness of deficits     General Comments: Pt reports new difficulty with differentiating L and R. Increased time required to process instructtions and respond.      General Comments      Exercises     Assessment/Plan    PT Assessment Patient needs continued PT services  PT Problem List Decreased strength;Decreased balance;Decreased mobility;Decreased safety awareness       PT Treatment Interventions DME instruction;Gait training;Stair training;Functional mobility training;Therapeutic activities;Therapeutic exercise;Balance training;Neuromuscular re-education;Cognitive remediation;Patient/family education    PT Goals (Current goals can be found in the Care Plan section)  Acute Rehab PT Goals Patient Stated Goal: to return to normal PT Goal Formulation: With patient Time For Goal Achievement: 10/23/20 Potential to Achieve Goals: Good    Frequency Min 3X/week   Barriers to discharge Decreased caregiver support      Co-evaluation               AM-PAC PT "6 Clicks" Mobility  Outcome Measure Help needed turning from your back to your side while in a flat bed without using bedrails?: None Help needed moving from lying on your back to sitting on the side of a flat bed without using bedrails?: A Little Help needed moving to and from a bed to a chair (including a wheelchair)?: A Little Help  needed standing up from a chair using your arms (e.g., wheelchair or bedside chair)?: A Little Help needed to walk in hospital room?: A Little Help needed climbing 3-5 steps with a railing? : A Little 6 Click Score:  19    End of Session Equipment Utilized During Treatment: Gait belt Activity Tolerance: Patient tolerated treatment well Patient left: with call bell/phone within reach;with nursing/sitter in room;in chair Nurse Communication: Mobility status;Precautions PT Visit Diagnosis: Unsteadiness on feet (R26.81);Other abnormalities of gait and mobility (R26.89);Muscle weakness (generalized) (M62.81);Difficulty in walking, not elsewhere classified (R26.2);Other symptoms and signs involving the nervous system (R29.898);Hemiplegia and hemiparesis Hemiplegia - Right/Left: Left Hemiplegia - dominant/non-dominant: Non-dominant Hemiplegia - caused by: Cerebral infarction    Time: 7517-0017 PT Time Calculation (min) (ACUTE ONLY): 50 min   Charges:   PT Evaluation $PT Eval Moderate Complexity: 1 Mod PT Treatments $Gait Training: 23-37 mins        Raymond Gurney, PT, DPT Acute Rehabilitation Services  Pager: [phone] Office: [phone]   Jewel Baize 10/16/2020, 2:53 PM

## 2020-10-16 NOTE — Assessment & Plan Note (Signed)
-   cessation again recommended

## 2020-10-16 NOTE — Assessment & Plan Note (Signed)
-   cessation counseling given

## 2020-10-16 NOTE — Assessment & Plan Note (Signed)
-   permissive HTN - will gradually resume some home med regimen

## 2020-10-16 NOTE — Assessment & Plan Note (Signed)
-   A1c 5.9% - resume home meds at discharge - SSI while inpatient

## 2020-10-17 LAB — GLUCOSE, CAPILLARY
Glucose-Capillary: 100 mg/dL — ABNORMAL HIGH (ref 70–99)
Glucose-Capillary: 144 mg/dL — ABNORMAL HIGH (ref 70–99)
Glucose-Capillary: 84 mg/dL (ref 70–99)
Glucose-Capillary: 160 mg/dL — ABNORMAL HIGH (ref 70–99)

## 2020-10-17 LAB — CBC WITH DIFFERENTIAL/PLATELET
Abs Immature Granulocytes: 0.01 10*3/uL (ref 0.00–0.07)
Basophils Absolute: 0 10*3/uL (ref 0.0–0.1)
Basophils Relative: 0 %
Eosinophils Absolute: 0.3 10*3/uL (ref 0.0–0.5)
Eosinophils Relative: 6 %
HCT: 39.3 % (ref 39.0–52.0)
Hemoglobin: 13.8 g/dL (ref 13.0–17.0)
Immature Granulocytes: 0 %
Lymphocytes Relative: 43 %
Lymphs Abs: 2.4 10*3/uL (ref 0.7–4.0)
MCH: 27.3 pg (ref 26.0–34.0)
MCHC: 35.1 g/dL (ref 30.0–36.0)
MCV: 77.7 fL — ABNORMAL LOW (ref 80.0–100.0)
Monocytes Absolute: 0.8 10*3/uL (ref 0.1–1.0)
Monocytes Relative: 13 %
Neutro Abs: 2.2 10*3/uL (ref 1.7–7.7)
Neutrophils Relative %: 38 %
Platelets: 195 10*3/uL (ref 150–400)
RBC: 5.06 MIL/uL (ref 4.22–5.81)
RDW: 13.5 % (ref 11.5–15.5)
WBC: 5.7 10*3/uL (ref 4.0–10.5)
nRBC: 0 % (ref 0.0–0.2)

## 2020-10-17 LAB — BASIC METABOLIC PANEL
Anion gap: 10 (ref 5–15)
BUN: 13 mg/dL (ref 8–23)
CO2: 25 mmol/L (ref 22–32)
Calcium: 8 mg/dL — ABNORMAL LOW (ref 8.9–10.3)
Chloride: 101 mmol/L (ref 98–111)
Creatinine, Ser: 1.39 mg/dL — ABNORMAL HIGH (ref 0.61–1.24)
GFR, Estimated: 58 mL/min — ABNORMAL LOW (ref >60)
Glucose, Bld: 107 mg/dL — ABNORMAL HIGH (ref 70–99)
Potassium: 2.8 mmol/L — ABNORMAL LOW (ref 3.5–5.1)
Sodium: 136 mmol/L (ref 135–145)

## 2020-10-17 LAB — MAGNESIUM: Magnesium: 1.7 mg/dL (ref 1.7–2.4)

## 2020-10-17 MED ORDER — HYDRALAZINE HCL 50 MG PO TABS
50.0000 mg | ORAL_TABLET | Freq: Once | ORAL | Status: AC
  Administered 2020-10-17: 50 mg via ORAL
  Filled 2020-10-17: qty 1

## 2020-10-17 MED ORDER — POTASSIUM CHLORIDE CRYS ER 20 MEQ PO TBCR
40.0000 meq | EXTENDED_RELEASE_TABLET | Freq: Once | ORAL | Status: AC
  Administered 2020-10-17: 40 meq via ORAL
  Filled 2020-10-17: qty 2

## 2020-10-17 MED ORDER — CLOPIDOGREL BISULFATE 75 MG PO TABS: 75.0000 mg | ORAL_TABLET | Freq: Every day | ORAL | 0 refills | Status: AC

## 2020-10-17 MED ORDER — HYDROCHLOROTHIAZIDE 25 MG PO TABS
25.0000 mg | ORAL_TABLET | Freq: Every day | ORAL | Status: DC
  Administered 2020-10-17: 25 mg via ORAL
  Filled 2020-10-17: qty 1

## 2020-10-17 MED ORDER — ATORVASTATIN CALCIUM 40 MG PO TABS
40.0000 mg | ORAL_TABLET | Freq: Every day | ORAL | 3 refills | Status: DC
Start: 2020-10-18 — End: 2023-07-15

## 2020-10-17 MED ORDER — ASPIRIN 81 MG PO CHEW
81.0000 mg | CHEWABLE_TABLET | Freq: Every day | ORAL | Status: DC
Start: 2020-10-18 — End: 2024-07-22

## 2020-10-17 NOTE — Progress Notes (Signed)
Pt eating dinner. Denies any symptoms r/t hypertensive at this time. States "I feel fine" Will continue to monitor

## 2020-10-17 NOTE — Evaluation (Signed)
Occupational Therapy Evaluation Patient Details Name: Calvin Jackson MRN: [id number] DOB: Feb 11, 1961 Today's Date: 10/17/2020    History of Present Illness Pt is a 59 yo male presenting with worsening L side weakness. Pt did not receive tPA. NIHSS 0. MRI revealed R temporoparietal infarct, R corona radiata infarct, few acute cortical infarcts also suspected within R parietal lobe, remote supratentorial cortical and subcortical infarcts, and remove bilateral cerebellar infarcts. CTA bilateral ophthalmic ICA 21mm infundibulum vs early aneurysm. 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   Clinical Impression   This 59 y/o male presents with the above. PTA pt reports being independent with ADL, iADL and functional mobility. Pt currently performing mobility using RW and ADL tasks overall with minguard assist. Further educated/reviewed safe RW use within the home environment as pt typically ambulatory without AD. Pt verbalizing/return demonstrating understanding throughout. He reports he lives with sister who is available intermittently to assist. Pt to benefit from continued acute OT services and currently recommend followup HHOT services after discharge to maximize his overall safety and independence with ADL and mobility. Pt likely to d/c home today.     Follow Up Recommendations  Home health OT;Supervision/Assistance - 24 hour (24hr initially)    Equipment Recommendations  3 in 1 bedside commode           Precautions / Restrictions Precautions Precautions: Fall Restrictions Weight Bearing Restrictions: No      Mobility Bed Mobility Overal bed mobility: Needs Assistance Bed Mobility: Supine to Sit;Sit to Supine     Supine to sit: Supervision Sit to supine: Supervision   General bed mobility comments: HOB elevated, no assist required     Transfers Overall transfer level: Needs assistance Equipment used: Rolling walker  (2 wheeled);None Transfers: Sit to/from Stand Sit to Stand: Min guard         General transfer comment: for balance and safety, no assist required     Balance Overall balance assessment: Needs assistance Sitting-balance support: No upper extremity supported;Feet supported Sitting balance-Leahy Scale: Good     Standing balance support: No upper extremity supported;Bilateral upper extremity supported;During functional activity Standing balance-Leahy Scale: Fair Standing balance comment: able to mobilize short distance to RW in his room with minguard assist ; increased stability using UE support of RW with increased mobility distance                            ADL either performed or assessed with clinical judgement   ADL Overall ADL's : Needs assistance/impaired Eating/Feeding: Modified independent;Sitting   Grooming: Min guard;Standing   Upper Body Bathing: Supervision/ safety;Sitting   Lower Body Bathing: Min guard;Sit to/from stand   Upper Body Dressing : Set up;Sitting   Lower Body Dressing: Min guard;Sit to/from stand   Toilet Transfer: Min guard;Ambulation;RW Toilet Transfer Details (indicate cue type and reason): simulated via transfer to/from EOB, mobility in room and short distance in hallway with RW Toileting- Clothing Manipulation and Hygiene: Min guard;Sit to/from Nurse, children's Details (indicate cue type and reason): educated in safe transfer techniques; educated to use 3:1 as shower seat for added safety/balance, pt verbalizing understanding  Functional mobility during ADLs: Min guard;Rolling walker       Vision Patient Visual Report: No change from baseline Additional Comments: pt bumping into item x1 to his L during hallway mobility      Perception  Praxis      Pertinent Vitals/Pain Pain Assessment: No/denies pain     Hand Dominance Right   Extremity/Trunk Assessment Upper Extremity Assessment Upper Extremity  Assessment: Overall WFL for tasks assessed (strength symmetrical )   Lower Extremity Assessment Lower Extremity Assessment: Defer to PT evaluation   Cervical / Trunk Assessment Cervical / Trunk Assessment: Normal   Communication Communication Communication: No difficulties   Cognition Arousal/Alertness: Awake/alert Behavior During Therapy: WFL for tasks assessed/performed Overall Cognitive Status: No family/caregiver present to determine baseline cognitive functioning                                 General Comments: pt with some decreased awareness of environment bumping into item x1 to his L in the hallway, not formally assessed though overall appears appropriate for basic tasks. pt asking good questions in prep for d/c home   General Comments       Exercises     Shoulder Instructions      Home Living Family/patient expects to be discharged to:: Private residence Living Arrangements: Other relatives (sister) Available Help at Discharge: Available PRN/intermittently;Family (sister (works M-F 6am-5pm)) Type of Home: House Home Access: Stairs to enter Secretary/administrator of Steps: 4 Entrance Stairs-Rails: Can reach both Home Layout: One level     Bathroom Shower/Tub: Corporate treasurer: Yes   Home Equipment: None      Lives With: Other (Comment) (sister)    Prior Functioning/Environment Level of Independence: Independent        Comments: Pt works at [employer] full-time. Pt does not drive, he walks or takes public transportation.         OT Problem List: Decreased strength;Decreased activity tolerance;Impaired balance (sitting and/or standing);Decreased knowledge of use of DME or AE      OT Treatment/Interventions: Self-care/ADL training;Therapeutic exercise;Energy conservation;DME and/or AE instruction;Therapeutic activities;Visual/perceptual  remediation/compensation;Patient/family education;Balance training;Cognitive remediation/compensation    OT Goals(Current goals can be found in the care plan section) Acute Rehab OT Goals Patient Stated Goal: to return to normal OT Goal Formulation: With patient Time For Goal Achievement: 10/31/20 Potential to Achieve Goals: Good  OT Frequency: Min 2X/week   Barriers to D/C:            Co-evaluation              AM-PAC OT "6 Clicks" Daily Activity     Outcome Measure Help from another person eating meals?: None Help from another person taking care of personal grooming?: A Little Help from another person toileting, which includes using toliet, bedpan, or urinal?: A Little Help from another person bathing (including washing, rinsing, drying)?: A Little Help from another person to put on and taking off regular upper body clothing?: A Little Help from another person to put on and taking off regular lower body clothing?: A Little 6 Click Score: 19   End of Session Equipment Utilized During Treatment: Rolling walker Nurse Communication: Mobility status  Activity Tolerance: Patient tolerated treatment well Patient left: in bed;with call bell/phone within reach;with bed alarm set  OT Visit Diagnosis: Other abnormalities of gait and mobility (R26.89);Other symptoms and signs involving the nervous system (R29.898)                Time: 1275-1700 OT Time Calculation (min): 15 min Charges:  OT General Charges $OT Visit: 1 Visit OT Evaluation $OT Eval Moderate Complexity: 1  Mod  Marcy Siren, OT Acute Rehabilitation Services Pager [phone] Office [phone]  Orlando Penner 10/17/2020, 3:58 PM

## 2020-10-17 NOTE — TOC CAGE-AID Note (Signed)
Transition of Care (TOC) - CAGE-AID Screening   Patient Details  Name: Calvin Jackson MRN: [id number] Date of Birth: 12/10/1960  Transition of Care (TOC) CM/SW Contact:    Miranda  Gainey, LCSWA Phone Number: 10/17/2020, 3:20 PM   Clinical Narrative:  CSW met with pt at bedside. CSW introduced self and explained role at the hospital.  Pt denies alcohol use. Pt reports occasional marijuana use. Pt has no interest in stopping use. Pt did not need any resources at this time.    CAGE-AID Screening:    Have You Ever Felt You Ought to Cut Down on Your Drinking or Drug Use?: No Have People Annoyed You By Critizing Your Drinking Or Drug Use?: No Have You Felt Bad Or Guilty About Your Drinking Or Drug Use?: No Have You Ever Had a Drink or Used Drugs First Thing In The Morning to Steady Your Nerves or to Get Rid of a Hangover?: No CAGE-AID Score: 0  Substance Abuse Education Offered: Yes    Miranda Gainey, LCSWA, LCASA Clinical Social Worker [phone]       

## 2020-10-17 NOTE — TOC Transition Note (Signed)
Transition of Care [facility]) - CM/SW Discharge Note   Patient Details  Name: Calvin Jackson MRN: [id number] Date of Birth: Jul 06, 1961  [facility] CM/SW Contact:  Lawerance Sabal, RN Phone Number: 10/17/2020, 3:10 PM   Clinical Narrative:    Spoke w patient, confirmed he needed RW and 3/1, ordered to be delivered to room prior to his DC    Final next level of care: Home/Self Care Barriers to Discharge: No Barriers Identified   Patient Goals and CMS Choice        Discharge Placement                       Discharge Plan and Services                DME Arranged: Walker rolling DME Agency: [employer] Date DME Agency Contacted: 10/17/20 Time DME Agency Contacted: 1510 Representative spoke with at DME Agency: Arnold Long            Social Determinants of Health (SDOH) Interventions     Readmission Risk Interventions No flowsheet data found.

## 2020-10-17 NOTE — Progress Notes (Signed)
Notified Dr. Frederick Peers regarding patients blood pressures (see vs sheet) No improvement following administration of prn hydralazine. New orders received for more bp meds given. Plan is to recheck bp in one hour. If less than or equal to 180/90 patient can be discharged, otherwise he will stay over night for bp control. Pt and family (sister) aware of plan of care. Both Verbalize understanding.

## 2020-10-17 NOTE — Progress Notes (Signed)
Physical Therapy Treatment Patient Details Name: Calvin Jackson MRN: [id number] DOB: 06/19/1961 Today's Date: 10/17/2020    History of Present Illness Pt is a 59 yo male presenting with worsening L side weakness. Pt did not receive tPA. NIHSS 0. MRI revealed R temporoparietal infarct, R corona radiata infarct, few acute cortical infarcts also suspected within R parietal lobe, remote supratentorial cortical and subcortical infarcts, and remove bilateral cerebellar infarcts. 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    PT Comments    Pt supine in bed on arrival.  Pt eager to mobilize and perform stair training.  Pt reports L tight heel cord.  PTA educated on stretching to improve ROM.  Plan for HHPT equipment has been delivered to his room and adjusted for his height.     Follow Up Recommendations  Home health PT;Supervision for mobility/OOB     Equipment Recommendations  Rolling walker with 5" wheels;3in1 (PT)    Recommendations for Other Services       Precautions / Restrictions Precautions Precautions: Fall Restrictions Weight Bearing Restrictions: No    Mobility  Bed Mobility Overal bed mobility: Needs Assistance Bed Mobility: Supine to Sit;Sit to Supine     Supine to sit: Modified independent (Device/Increase time) Sit to supine: Modified independent (Device/Increase time)   General bed mobility comments: HOB elevated, no assist required   Transfers Overall transfer level: Needs assistance Equipment used: Rolling walker (2 wheeled) Transfers: Sit to/from Stand Sit to Stand: Supervision         General transfer comment: for balance and safety, no assist required   Ambulation/Gait Ambulation/Gait assistance: Min guard Gait Distance (Feet): 150 Feet Assistive device: Rolling walker (2 wheeled) Gait Pattern/deviations: Step-through  pattern;Decreased step length - left;Decreased stride length;Trunk flexed     General Gait Details: Cues for L heel strike.   Stairs Stairs: Yes Stairs assistance: Supervision Stair Management: Two rails;Step to pattern Number of Stairs: 12 General stair comments: Performed multiple reps and focused on safety and sequencing.   Wheelchair Mobility    Modified Rankin (Stroke Patients Only) Modified Rankin (Stroke Patients Only) Pre-Morbid Rankin Score: No significant disability Modified Rankin: Moderately severe disability     Balance Overall balance assessment: Needs assistance Sitting-balance support: No upper extremity supported;Feet supported Sitting balance-Leahy Scale: Good Sitting balance - Comments: Static sitting wihtout LOB.   Standing balance support: No upper extremity supported;Bilateral upper extremity supported;During functional activity Standing balance-Leahy Scale: Fair Standing balance comment: able to mobilize short distance to RW in his room with minguard assist ; increased stability using UE support of RW with increased mobility distance                             Cognition Arousal/Alertness: Awake/alert Behavior During Therapy: WFL for tasks assessed/performed Overall Cognitive Status: No family/caregiver present to determine baseline cognitive functioning Area of Impairment: Following commands;Safety/judgement                       Following Commands: Follows one step commands consistently;Follows one step commands with increased time;Follows multi-step commands inconsistently       General Comments: pt with some decreased awareness of environment bumping into item x1 to his L in the hallway, not formally assessed though overall appears appropriate for basic tasks. pt asking good questions in prep for  d/c home      Exercises Other Exercises Other Exercises: Heel cord stretch, performed standing and seated.  Pt utilized wall  in standing and gt belt in sitting.    General Comments        Pertinent Vitals/Pain Pain Assessment: No/denies pain    Home Living Family/patient expects to be discharged to:: Private residence Living Arrangements: Other relatives (sister) Available Help at Discharge: Available PRN/intermittently;Family (sister (works M-F 6am-5pm)) Type of Home: House Home Access: Stairs to enter Entrance Stairs-Rails: Can reach both Home Layout: One level Home Equipment: None      Prior Function Level of Independence: Independent      Comments: Pt works at [employer] full-time. Pt does not drive, he walks or takes public transportation.    PT Goals (current goals can now be found in the care plan section) Acute Rehab PT Goals Patient Stated Goal: to return to normal Potential to Achieve Goals: Good Progress towards PT goals: Progressing toward goals    Frequency    Min 3X/week      PT Plan Current plan remains appropriate    Co-evaluation              AM-PAC PT "6 Clicks" Mobility   Outcome Measure  Help needed turning from your back to your side while in a flat bed without using bedrails?: None Help needed moving from lying on your back to sitting on the side of a flat bed without using bedrails?: A Little Help needed moving to and from a bed to a chair (including a wheelchair)?: A Little Help needed standing up from a chair using your arms (e.g., wheelchair or bedside chair)?: A Little Help needed to walk in hospital room?: A Little Help needed climbing 3-5 steps with a railing? : A Little 6 Click Score: 19    End of Session Equipment Utilized During Treatment: Gait belt Activity Tolerance: Patient tolerated treatment well Patient left: with call bell/phone within reach;with nursing/sitter in room;in chair Nurse Communication: Mobility status;Precautions PT Visit Diagnosis: Unsteadiness on feet (R26.81);Other abnormalities of gait and mobility  (R26.89);Muscle weakness (generalized) (M62.81);Difficulty in walking, not elsewhere classified (R26.2);Other symptoms and signs involving the nervous system (R29.898);Hemiplegia and hemiparesis Hemiplegia - Right/Left: Left Hemiplegia - dominant/non-dominant: Non-dominant Hemiplegia - caused by: Cerebral infarction     Time: 5631-4970 PT Time Calculation (min) (ACUTE ONLY): 17 min  Charges:  $Gait Training: 8-22 mins                     Bonney Leitz , PTA Acute Rehabilitation Services Pager [phone] Office [phone]     Tangy Drozdowski Artis Delay 10/17/2020, 4:30 PM

## 2020-10-17 NOTE — Progress Notes (Signed)
Pt's BP 159/80, P 61, RR 18, & temp 98.0 at this time, no s/s distress, denies of pain, according to previous shift nurse Rosalita Chessman, RN if BP equal or below 180/90, pt can discharge to home to night per MD order.

## 2020-10-17 NOTE — Progress Notes (Signed)
Patient left the unit at 2045 pm in a wheel chair, nurse escorted to ED exit, pt's sister received him.

## 2020-10-18 ENCOUNTER — Other Ambulatory Visit: Payer: Self-pay | Admitting: Internal Medicine

## 2020-10-18 DIAGNOSIS — I639 Cerebral infarction, unspecified: Secondary | ICD-10-CM

## 2020-10-18 MED ORDER — FAMOTIDINE 40 MG PO TABS: 40.0000 mg | ORAL_TABLET | Freq: Every evening | ORAL | 1 refills | Status: DC

## 2020-10-18 MED ORDER — CLOPIDOGREL BISULFATE 75 MG PO TABS: 75.0000 mg | ORAL_TABLET | Freq: Every day | ORAL | 0 refills | Status: AC

## 2020-10-18 NOTE — Discharge Summary (Signed)
Physician Discharge Summary   Calvin Jackson UJW:[id number] DOB: 08-Jan-1961 DOA: 10/15/2020  PCP: No primary care provider on file.  Admit date: 10/15/2020 Discharge date: 10/18/2020  Admitted From: home Disposition:  home Discharging physician: Lewie Chamber, MD  Recommendations for Outpatient Follow-up:  1. Follow up with neurology  Patient discharged to home in Discharge Condition: stable CODE STATUS: Full Diet recommendation:  Diet Orders (From admission, onward)    Start     Ordered   10/17/20 0000  Diet - low sodium heart healthy        10/17/20 1448   10/17/20 0000  Diet Carb Modified        10/17/20 1448          Hospital Course: Mr. Suen is a 59 yo male with PMH DMII, HTN, HLD, prior CVA who presented with left sided weakness. He stated his previous stroke also affected his left side and he does not have residual deficits.  He underwent MRI brain which showed acute right temporoparietal infarct, right corona radiata (possibly involving right caudate nucleus), and a few small subtle cortical infarcts involving the right parietal lobe.  He was evaluated by neurology and recommended for aspirin and Plavix for 3 months then aspirin alone. He was evaluated by PT/OT with recommendations for home health at discharge.   * Acute CVA (cerebrovascular accident) (HCC) - presented with left side weakness He underwent MRI brain which showed acute right temporoparietal infarct, right corona radiata (possibly involving right caudate nucleus), and a few small subtle cortical infarcts involving the right parietal lobe.  - follow up PT/OT: home heath at d/c - per neuro, DAPT x 3 months followed by asa - LDL 75, continue lipitor   Tobacco dependence - cessation counseling given   Marijuana abuse - cessation again recommended   Diabetes mellitus with neurological manifestation (HCC) - A1c 5.9% - resume home meds at discharge - SSI while inpatient   Dyslipidemia -  continue lipitor   Essential hypertension - permissive HTN - will gradually resume some home med regimen    The patient's chronic medical conditions were treated accordingly per the patient's home medication regimen except as noted.  On day of discharge, patient was felt deemed stable for discharge. Patient/family member advised to call PCP or come back to ER if needed.   Principal Diagnosis: Acute CVA (cerebrovascular accident) [facility])  Discharge Diagnoses: Active Hospital Problems   Diagnosis Date Noted  . Acute CVA (cerebrovascular accident) (HCC) 10/15/2020    Priority: High  . Marijuana abuse 10/15/2020  . Tobacco dependence 10/15/2020  . Essential hypertension   . Dyslipidemia   . Diabetes mellitus with neurological manifestation [facility])     Resolved Hospital Problems  No resolved problems to display.    Discharge Instructions    Diet - low sodium heart healthy   Complete by: As directed    Diet Carb Modified   Complete by: As directed    Increase activity slowly   Complete by: As directed      Allergies as of 10/17/2020   No Known Allergies     Medication List    TAKE these medications   aspirin 81 MG chewable tablet Chew 1 tablet (81 mg total) by mouth daily.   atorvastatin 40 MG tablet Commonly known as: LIPITOR Take 1 tablet (40 mg total) by mouth daily.   clopidogrel 75 MG tablet Commonly known as: PLAVIX Take 1 tablet (75 mg total) by mouth daily for 21 days.   cyclobenzaprine  10 MG tablet Commonly known as: FLEXERIL Take 10 mg by mouth at bedtime as needed for muscle spasms.   Flomax 0.4 MG Caps capsule Generic drug: tamsulosin Take 0.4 mg by mouth at bedtime.   hydrochlorothiazide 25 MG tablet Commonly known as: HYDRODIURIL Take 25 mg by mouth daily.   losartan 100 MG tablet Commonly known as: COZAAR Take 100 mg by mouth daily.   metFORMIN 500 MG tablet Commonly known as: GLUCOPHAGE Take 500 mg by mouth daily.   MiraLax 17 GM/SCOOP  powder Generic drug: polyethylene glycol powder Take 17 g by mouth daily as needed for mild constipation.       No Known Allergies  Consultations: Neurology  Discharge Exam: BP (!) 159/80 (BP Location: Left Arm)   Pulse 61   Temp 98 F (36.7 C) (Oral)   Resp 18   Ht 5\' 10"  (1.778 m)   Wt 83.9 kg   SpO2 98%   BMI 26.54 kg/m  General appearance: alert, cooperative and no distress Head: Normocephalic, without obvious abnormality, atraumatic Eyes: EOMI Lungs: clear to auscultation bilaterally Heart: regular rate and rhythm and S1, S2 normal Abdomen: normal findings: bowel sounds normal and soft, non-tender Extremities: no edema Skin: mobility and turgor normal Neurologic: Grossly normal  The results of significant diagnostics from this hospitalization (including imaging, microbiology, ancillary and laboratory) are listed below for reference.   Microbiology: Recent Results (from the past 240 hour(s))  Respiratory Panel by RT PCR (Flu A&B, Covid) - Nasopharyngeal Swab     Status: None   Collection Time: 10/15/20  5:51 AM   Specimen: Nasopharyngeal Swab  Result Value Ref Range Status   SARS Coronavirus 2 by RT PCR NEGATIVE NEGATIVE Final    Comment: (NOTE) SARS-CoV-2 target nucleic acids are NOT DETECTED.  The SARS-CoV-2 RNA is generally detectable in upper respiratoy specimens during the acute phase of infection. The lowest concentration of SARS-CoV-2 viral copies this assay can detect is 131 copies/mL. A negative result does not preclude SARS-Cov-2 infection and should not be used as the sole basis for treatment or other patient management decisions. A negative result may occur with  improper specimen collection/handling, submission of specimen other than nasopharyngeal swab, presence of viral mutation(s) within the areas targeted by this assay, and inadequate number of viral copies (<131 copies/mL). A negative result must be combined with clinical observations,  patient history, and epidemiological information. The expected result is Negative.  Fact Sheet for Patients:  13/11/21  Fact Sheet for Healthcare Providers:  https://www.moore.com/  This test is no t yet approved or cleared by the https://www.young.biz/ FDA and  has been authorized for detection and/or diagnosis of SARS-CoV-2 by FDA under an Emergency Use Authorization (EUA). This EUA will remain  in effect (meaning this test can be used) for the duration of the COVID-19 declaration under Section 564(b)(1) of the Act, 21 U.S.C. section 360bbb-3(b)(1), unless the authorization is terminated or revoked sooner.     Influenza A by PCR NEGATIVE NEGATIVE Final   Influenza B by PCR NEGATIVE NEGATIVE Final    Comment: (NOTE) The Xpert Xpress SARS-CoV-2/FLU/RSV assay is intended as an aid in  the diagnosis of influenza from Nasopharyngeal swab specimens and  should not be used as a sole basis for treatment. Nasal washings and  aspirates are unacceptable for Xpert Xpress SARS-CoV-2/FLU/RSV  testing.  Fact Sheet for Patients: [place]  Fact Sheet for Healthcare Providers: https://www.moore.com/  This test is not yet approved or cleared by the https://www.young.biz/  FDA and  has been authorized for detection and/or diagnosis of SARS-CoV-2 by  FDA under an Emergency Use Authorization (EUA). This EUA will remain  in effect (meaning this test can be used) for the duration of the  Covid-19 declaration under Section 564(b)(1) of the Act, 21  U.S.C. section 360bbb-3(b)(1), unless the authorization is  terminated or revoked. Performed at [facility], [address] [place], [place] [id number]      Labs: BNP (last 3 results) No results for input(s): BNP in the last 8760 hours. Basic Metabolic Panel: Recent Labs  Lab 10/15/20 0417 10/15/20 0422 10/17/20 0414  NA 143 144 136  K 3.3* 3.2* 2.8*   CL 109 105 101  CO2 26  --  25  GLUCOSE 121* 115* 107*  BUN 12 14 13   CREATININE 1.43* 1.30* 1.39*  CALCIUM 8.6*  --  8.0*  MG  --   --  1.7   Liver Function Tests: Recent Labs  Lab 10/15/20 0417  AST 21  ALT 21  ALKPHOS 54  BILITOT 1.0  PROT 6.6  ALBUMIN 3.4*   No results for input(s): LIPASE, AMYLASE in the last 168 hours. No results for input(s): AMMONIA in the last 168 hours. CBC: Recent Labs  Lab 10/15/20 0417 10/15/20 0422 10/17/20 0414  WBC 4.6  --  5.7  NEUTROABS 2.2  --  2.2  HGB 14.8 15.6 13.8  HCT 42.6 46.0 39.3  MCV 78.6*  --  77.7*  PLT 210  --  195   Cardiac Enzymes: No results for input(s): CKTOTAL, CKMB, CKMBINDEX, TROPONINI in the last 168 hours. BNP: Invalid input(s): POCBNP CBG: Recent Labs  Lab 10/16/20 2141 10/17/20 0615 10/17/20 1145 10/17/20 1700 10/17/20 2027  GLUCAP 109* 100* 144* 84 160*   D-Dimer No results for input(s): DDIMER in the last 72 hours. Hgb A1c No results for input(s): HGBA1C in the last 72 hours. Lipid Profile Recent Labs    10/16/20 0301  CHOL 133  HDL 46  LDLCALC 75  TRIG 62  CHOLHDL 2.9   Thyroid function studies No results for input(s): TSH, T4TOTAL, T3FREE, THYROIDAB in the last 72 hours.  Invalid input(s): FREET3 Anemia work up No results for input(s): VITAMINB12, FOLATE, FERRITIN, TIBC, IRON, RETICCTPCT in the last 72 hours. Urinalysis No results found for: COLORURINE, APPEARANCEUR, LABSPEC, PHURINE, GLUCOSEU, HGBUR, BILIRUBINUR, KETONESUR, PROTEINUR, UROBILINOGEN, NITRITE, LEUKOCYTESUR Sepsis Labs Invalid input(s): PROCALCITONIN,  WBC,  LACTICIDVEN Microbiology Recent Results (from the past 240 hour(s))  Respiratory Panel by RT PCR (Flu A&B, Covid) - Nasopharyngeal Swab     Status: None   Collection Time: 10/15/20  5:51 AM   Specimen: Nasopharyngeal Swab  Result Value Ref Range Status   SARS Coronavirus 2 by RT PCR NEGATIVE NEGATIVE Final    Comment: (NOTE) SARS-CoV-2 target nucleic  acids are NOT DETECTED.  The SARS-CoV-2 RNA is generally detectable in upper respiratoy specimens during the acute phase of infection. The lowest concentration of SARS-CoV-2 viral copies this assay can detect is 131 copies/mL. A negative result does not preclude SARS-Cov-2 infection and should not be used as the sole basis for treatment or other patient management decisions. A negative result may occur with  improper specimen collection/handling, submission of specimen other than nasopharyngeal swab, presence of viral mutation(s) within the areas targeted by this assay, and inadequate number of viral copies (<131 copies/mL). A negative result must be combined with clinical observations, patient history, and epidemiological information. The expected result is Negative.  Fact Sheet for Patients:  https://www.moore.com/  Fact Sheet for Healthcare Providers:  https://www.young.biz/  This test is no t yet approved or cleared by the [place] FDA and  has been authorized for detection and/or diagnosis of SARS-CoV-2 by FDA under an Emergency Use Authorization (EUA). This EUA will remain  in effect (meaning this test can be used) for the duration of the COVID-19 declaration under Section 564(b)(1) of the Act, 21 U.S.C. section 360bbb-3(b)(1), unless the authorization is terminated or revoked sooner.     Influenza A by PCR NEGATIVE NEGATIVE Final   Influenza B by PCR NEGATIVE NEGATIVE Final    Comment: (NOTE) The Xpert Xpress SARS-CoV-2/FLU/RSV assay is intended as an aid in  the diagnosis of influenza from Nasopharyngeal swab specimens and  should not be used as a sole basis for treatment. Nasal washings and  aspirates are unacceptable for Xpert Xpress SARS-CoV-2/FLU/RSV  testing.  Fact Sheet for Patients: https://www.moore.com/  Fact Sheet for Healthcare Providers: https://www.young.biz/  This test  is not yet approved or cleared by the [place] FDA and  has been authorized for detection and/or diagnosis of SARS-CoV-2 by  FDA under an Emergency Use Authorization (EUA). This EUA will remain  in effect (meaning this test can be used) for the duration of the  Covid-19 declaration under Section 564(b)(1) of the Act, 21  U.S.C. section 360bbb-3(b)(1), unless the authorization is  terminated or revoked. Performed at [facility], [address] [place], [place] [address]     Procedures/Studies: MR BRAIN WO CONTRAST  Result Date: 10/15/2020 CLINICAL DATA:  Neuro deficit, acute, stroke suspected. EXAM: MRI HEAD WITHOUT CONTRAST TECHNIQUE: Multiplanar, multiecho pulse sequences of the brain and surrounding structures were obtained without intravenous contrast. COMPARISON:  Noncontrast head CT and CT angiogram head/neck 10/15/2020. FINDINGS: Brain: Intermittent motion degradation. Most notably, there is moderate motion degradation of the coronal T2 weighted sequence. 0.7 x 1.3 x 0.5 cm focus of restricted diffusion within the right temporoparietal periventricular white matter consistent with acute infarction (series 5, image 76). 2.1 x 0.8 x 0.9 cm focus of restricted diffusion within the right corona radiata, possibly also involving portions of the adjacent caudate nucleus, compatible with acute infarction (series 5, image 80). A few small subtle acute cortical infarcts are also suspected within the right parietal lobe (for instance as seen on series 5, image 79) (series 7, image 45). Chronic cortical/subcortical infarct within the mid to anterior and lateral right frontal lobe/right frontal operculum. This infarct extends to the right insula anteriorly Chronic subcortical infarcts are also present within the bilateral cerebral white matter, basal ganglia and thalami. Small chronic infarcts are present within the bilateral cerebellar hemispheres. Severe scattered and confluent T2/FLAIR  hyperintensity within the cerebral white matter is nonspecific, but compatible chronic small vessel ischemic disease. To a lesser degree, chronic small vessel ischemic changes are also present within the pons. Mild SWI signal loss within the bilateral basal ganglia which may reflect chronic blood products or mineralization. No evidence of intracranial mass. No extra-axial fluid collection. No midline shift. Vascular: Expected proximal arterial flow voids. Skull and upper cervical spine: No focal marrow lesion. Cervical spondylosis. Sinuses/Orbits: Visualized orbits show no acute finding. Mild ethmoid and left maxillary sinus mucosal thickening. Small right maxillary sinus mucous retention cyst. Polypoid soft tissue within the anterior left nasal passage. IMPRESSION: Motion degraded examination. 1.3 cm acute infarct within the right temporoparietal periventricular white matter. 2.1 cm acute infarct within the right corona radiata, possibly also  involving portions of the adjacent right caudate nucleus. A few small subtle acute cortical infarcts are also suspected within the right parietal lobe. Severe chronic small vessel ischemic disease with remote supratentorial cortical and subcortical infarcts, and remote bilateral cerebellar infarcts. Paranasal sinus disease as described. Electronically Signed   By: Jackey Loge DO   On: 10/15/2020 07:56   ECHOCARDIOGRAM COMPLETE  Result Date: 10/15/2020    ECHOCARDIOGRAM REPORT   Patient Name:   PHILMORE LEPORE Date of Exam: 10/15/2020 Medical Rec #:  [id number]      Height: Accession #:    [id number]     Weight: Date of Birth:  [birth year]      BSA: Patient Age:    59 years       BP:           174/86 mmHg Patient Gender: M              HR:           60 bpm. Exam Location:  Inpatient Procedure: 2D Echo, Cardiac Doppler and Color Doppler Indications:   Stroke 434.91 / I163.9  History:       Patient has no prior history of Echocardiogram examinations.                Stroke.   Sonographer:   Renella Cunas RDCS Referring      [id number]IF[address] YATES Phys: IMPRESSIONS  1. Left ventricular ejection fraction, by estimation, is 55 to 60%. The left ventricle has normal function. The left ventricle demonstrates regional wall motion abnormalities with basal inferior hypokinesis. There is mild left ventricular hypertrophy. Left ventricular diastolic parameters are consistent with Grade I diastolic dysfunction (impaired relaxation).  2. Right ventricular systolic function is normal. The right ventricular size is normal. Tricuspid regurgitation signal is inadequate for assessing PA pressure.  3. The mitral valve is normal in structure. No evidence of mitral valve regurgitation. No evidence of mitral stenosis.  4. The aortic valve is grossly normal. Aortic valve regurgitation is not visualized. No aortic stenosis is present.  5. The inferior vena cava is normal in size with greater than 50% respiratory variability, suggesting right atrial pressure of 3 mmHg. FINDINGS  Left Ventricle: Left ventricular ejection fraction, by estimation, is 55 to 60%. The left ventricle has normal function. The left ventricle demonstrates regional wall motion abnormalities. The left ventricular internal cavity size was normal in size. There is mild left ventricular hypertrophy. Left ventricular diastolic parameters are consistent with Grade I diastolic dysfunction (impaired relaxation). Right Ventricle: The right ventricular size is normal. No increase in right ventricular wall thickness. Right ventricular systolic function is normal. Tricuspid regurgitation signal is inadequate for assessing PA pressure. Left Atrium: Left atrial size was normal in size. Right Atrium: Right atrial size was normal in size. Pericardium: There is no evidence of pericardial effusion. Mitral Valve: The mitral valve is normal in structure. No evidence of mitral valve regurgitation. No evidence of mitral valve stenosis. Tricuspid Valve: The tricuspid  valve is normal in structure. Tricuspid valve regurgitation is trivial. Aortic Valve: The aortic valve is grossly normal. Aortic valve regurgitation is not visualized. No aortic stenosis is present. Pulmonic Valve: The pulmonic valve was normal in structure. Pulmonic valve regurgitation is not visualized. Aorta: The aortic root is normal in size and structure. Venous: The inferior vena cava is normal in size with greater than 50% respiratory variability, suggesting right atrial pressure of 3 mmHg. IAS/Shunts: No atrial level shunt detected by color  flow Doppler.  LEFT VENTRICLE PLAX 2D LVIDd:         4.60 cm      Diastology LVIDs:         3.30 cm      LV e' medial:    6.73 cm/s LV PW:         0.90 cm      LV E/e' medial:  11.6 LV IVS:        0.90 cm      LV e' lateral:   9.00 cm/s LVOT diam:     2.10 cm      LV E/e' lateral: 8.7 LV SV:         71 LVOT Area:     3.46 cm  LV Volumes (MOD) LV vol d, MOD A2C: 102.0 ml LV vol d, MOD A4C: 104.0 ml LV vol s, MOD A2C: 42.0 ml LV vol s, MOD A4C: 48.1 ml LV SV MOD A2C:     60.0 ml LV SV MOD A4C:     104.0 ml LV SV MOD BP:      58.2 ml RIGHT VENTRICLE RV S prime:     14.70 cm/s TAPSE (M-mode): 2.5 cm LEFT ATRIUM             RIGHT ATRIUM LA diam:        3.60 cm RA Area:     16.60 cm LA Vol (A2C):   37.8 ml RA Volume:   43.10 ml LA Vol (A4C):   27.7 ml LA Biplane Vol: 32.5 ml  AORTIC VALVE LVOT Vmax:   102.00 cm/s LVOT Vmean:  60.600 cm/s LVOT VTI:    0.204 m  AORTA Ao Root diam: 3.00 cm MITRAL VALVE MV Area (PHT): 3.42 cm    SHUNTS MV Decel Time: 222 msec    Systemic VTI:  0.20 m MV E velocity: 78.40 cm/s  Systemic Diam: 2.10 cm MV A velocity: 74.60 cm/s MV E/A ratio:  1.05 Marca Ancona MD Electronically signed by Marca Ancona MD Signature Date/Time: 10/15/2020/2:43:59 PM    Final    CT HEAD CODE STROKE WO CONTRAST  Result Date: 10/15/2020 CLINICAL DATA:  Code stroke.  Left leg drift EXAM: CT HEAD WITHOUT CONTRAST TECHNIQUE: Contiguous axial images were obtained from  the base of the skull through the vertex without intravenous contrast. COMPARISON:  None. FINDINGS: Brain: No acute cortical infarct, hemorrhage, hydrocephalus, or masslike finding. Numerous remote subcortical infarcts, lacunar and perforator, traversing the basal ganglia and deep white matter tracks. Chronic small vessel ischemia in the cerebral white matter that is confluent. There has been prior patchy cortical infarction in the anterior division right MCA territory. Small remote right cerebellar infarcts. Vascular: Mid basilar appears high-density on axial slices, likely from streak artifact based on reformats. Skull: Nonspecific sclerosis in the clivus, the overall benign appearing margins. Sinuses/Orbits: Mucosal thickening in the paranasal sinuses and nasal cavity. Other: These results were called by telephone at the time of interpretation on 10/15/2020 at 4:25 am to provider Dr Derry Lory who verbally acknowledged these results. ASPECTS [facility] Stroke Program Early CT Score) Not scored given the extent of chronic changes. IMPRESSION: 1. Advanced chronic small vessel disease with numerous remote cortical and subcortical infarcts. 2. Intermittently high-density basilar which is likely from streak artifact. CTA is pending. Electronically Signed   By: Marnee Spring M.D.   On: 10/15/2020 04:30   CT ANGIO HEAD CODE STROKE  Result Date: 10/15/2020 CLINICAL DATA:  Left leg drift.  History  of stroke EXAM: CT ANGIOGRAPHY HEAD AND NECK TECHNIQUE: Multidetector CT imaging of the head and neck was performed using the standard protocol during bolus administration of intravenous contrast. Multiplanar CT image reconstructions and MIPs were obtained to evaluate the vascular anatomy. Carotid stenosis measurements (when applicable) are obtained utilizing NASCET criteria, using the distal internal carotid diameter as the denominator. CONTRAST:  48mL OMNIPAQUE IOHEXOL 350 MG/ML SOLN COMPARISON:  Head CT from earlier the  same day FINDINGS: CTA NECK FINDINGS Aortic arch: 3 vessel branching.  No acute finding or dilatation. Right carotid system: Vessels are smooth and widely patent with no noted atheromatous changes. Left carotid system: Vessels are smooth and widely patent with mild atheromatous changes at the bifurcation. Vertebral arteries: No proximal subclavian stenosis. The vertebral arteries are smooth and widely patent. Skeleton: Few remaining teeth with osteitis around the carious right lower molar. Isolated sclerotic focus in the clivus which has an overall benign appearance. There is multilevel degenerative disc narrowing and ridging. Other neck: Negative Upper chest: Airway thickening at the apices. Review of the MIP images confirms the above findings CTA HEAD FINDINGS Anterior circulation: Mild atheromatous narrowing and calcification of bilateral carotid siphons. Branch vessel atheromatous type irregularity, most notably affecting the right M1 segment where there is moderate stenosis that is most prominent on coronal reformats. 1 mm outpouchings of the bilateral ophthalmic ICA. Posterior circulation: The vertebral and basilar arteries are smooth and widely patent. Moderate right P2 segment narrowing. Atheromatous irregularity of bilateral PCA branches. Negative for aneurysm Venous sinuses: Patent Anatomic variants: None unusual Review of the MIP images confirms the above findings IMPRESSION: 1. No emergent finding. 2. Primarily intracranial atherosclerosis most notably causing right M1 and right P2 moderate narrowings. 3. 1 mm outpouchings at the bilateral ophthalmic ICA which could be infundibulum or early aneurysm. Electronically Signed   By: Marnee Spring M.D.   On: 10/15/2020 04:49   CT ANGIO NECK CODE STROKE  Result Date: 10/15/2020 CLINICAL DATA:  Left leg drift.  History of stroke EXAM: CT ANGIOGRAPHY HEAD AND NECK TECHNIQUE: Multidetector CT imaging of the head and neck was performed using the standard  protocol during bolus administration of intravenous contrast. Multiplanar CT image reconstructions and MIPs were obtained to evaluate the vascular anatomy. Carotid stenosis measurements (when applicable) are obtained utilizing NASCET criteria, using the distal internal carotid diameter as the denominator. CONTRAST:  68mL OMNIPAQUE IOHEXOL 350 MG/ML SOLN COMPARISON:  Head CT from earlier the same day FINDINGS: CTA NECK FINDINGS Aortic arch: 3 vessel branching.  No acute finding or dilatation. Right carotid system: Vessels are smooth and widely patent with no noted atheromatous changes. Left carotid system: Vessels are smooth and widely patent with mild atheromatous changes at the bifurcation. Vertebral arteries: No proximal subclavian stenosis. The vertebral arteries are smooth and widely patent. Skeleton: Few remaining teeth with osteitis around the carious right lower molar. Isolated sclerotic focus in the clivus which has an overall benign appearance. There is multilevel degenerative disc narrowing and ridging. Other neck: Negative Upper chest: Airway thickening at the apices. Review of the MIP images confirms the above findings CTA HEAD FINDINGS Anterior circulation: Mild atheromatous narrowing and calcification of bilateral carotid siphons. Branch vessel atheromatous type irregularity, most notably affecting the right M1 segment where there is moderate stenosis that is most prominent on coronal reformats. 1 mm outpouchings of the bilateral ophthalmic ICA. Posterior circulation: The vertebral and basilar arteries are smooth and widely patent. Moderate right P2 segment narrowing. Atheromatous irregularity of  bilateral PCA branches. Negative for aneurysm Venous sinuses: Patent Anatomic variants: None unusual Review of the MIP images confirms the above findings IMPRESSION: 1. No emergent finding. 2. Primarily intracranial atherosclerosis most notably causing right M1 and right P2 moderate narrowings. 3. 1 mm  outpouchings at the bilateral ophthalmic ICA which could be infundibulum or early aneurysm. Electronically Signed   By: Marnee Spring M.D.   On: 10/15/2020 04:49     Time coordinating discharge: Over 30 minutes    Lewie Chamber, MD  [facility] 10/18/2020, 5:49 PM

## 2021-09-24 ENCOUNTER — Other Ambulatory Visit: Payer: Self-pay

## 2021-09-24 ENCOUNTER — Emergency Department (HOSPITAL_COMMUNITY)
Admission: EM | Admit: 2021-09-24 | Discharge: 2021-09-25 | Disposition: A | Discharge status: Home / Self Care | Payer: Medicaid Other | Attending: Emergency Medicine | Admitting: Emergency Medicine

## 2021-09-24 ENCOUNTER — Emergency Department (HOSPITAL_COMMUNITY): Payer: Medicaid Other

## 2021-09-24 DIAGNOSIS — K469 Unspecified abdominal hernia without obstruction or gangrene: Secondary | ICD-10-CM

## 2021-09-24 DIAGNOSIS — Z79899 Other long term (current) drug therapy: Secondary | ICD-10-CM | POA: Insufficient documentation

## 2021-09-24 DIAGNOSIS — K5900 Constipation, unspecified: Secondary | ICD-10-CM

## 2021-09-24 DIAGNOSIS — I10 Essential (primary) hypertension: Secondary | ICD-10-CM | POA: Insufficient documentation

## 2021-09-24 DIAGNOSIS — K209 Esophagitis, unspecified without bleeding: Secondary | ICD-10-CM | POA: Insufficient documentation

## 2021-09-24 DIAGNOSIS — K5641 Fecal impaction: Secondary | ICD-10-CM | POA: Insufficient documentation

## 2021-09-24 DIAGNOSIS — E1149 Type 2 diabetes mellitus with other diabetic neurological complication: Secondary | ICD-10-CM | POA: Insufficient documentation

## 2021-09-24 DIAGNOSIS — Z7984 Long term (current) use of oral hypoglycemic drugs: Secondary | ICD-10-CM | POA: Insufficient documentation

## 2021-09-24 DIAGNOSIS — Z7982 Long term (current) use of aspirin: Secondary | ICD-10-CM | POA: Insufficient documentation

## 2021-09-24 DIAGNOSIS — F17210 Nicotine dependence, cigarettes, uncomplicated: Secondary | ICD-10-CM | POA: Insufficient documentation

## 2021-09-24 LAB — COMPREHENSIVE METABOLIC PANEL
ALT: 17 U/L (ref 0–44)
AST: 21 U/L (ref 15–41)
Albumin: 3.6 g/dL (ref 3.5–5.0)
Alkaline Phosphatase: 52 U/L (ref 38–126)
Anion gap: 7 (ref 5–15)
BUN: 23 mg/dL (ref 8–23)
CO2: 25 mmol/L (ref 22–32)
Calcium: 8.5 mg/dL — ABNORMAL LOW (ref 8.9–10.3)
Chloride: 106 mmol/L (ref 98–111)
Creatinine, Ser: 1.5 mg/dL — ABNORMAL HIGH (ref 0.61–1.24)
GFR, Estimated: 52 mL/min — ABNORMAL LOW (ref >60)
Glucose, Bld: 95 mg/dL (ref 70–99)
Potassium: 3.6 mmol/L (ref 3.5–5.1)
Sodium: 138 mmol/L (ref 135–145)
Total Bilirubin: 0.8 mg/dL (ref 0.3–1.2)
Total Protein: 6.8 g/dL (ref 6.5–8.1)

## 2021-09-24 LAB — URINALYSIS, ROUTINE W REFLEX MICROSCOPIC
Bilirubin Urine: NEGATIVE
Glucose, UA: NEGATIVE mg/dL
Hgb urine dipstick: NEGATIVE
Ketones, ur: NEGATIVE mg/dL
Leukocytes,Ua: NEGATIVE
Nitrite: NEGATIVE
Protein, ur: NEGATIVE mg/dL
Specific Gravity, Urine: 1.018 (ref 1.005–1.030)
pH: 5 (ref 5.0–8.0)

## 2021-09-24 LAB — CBC
HCT: 40.4 % (ref 39.0–52.0)
Hemoglobin: 14.2 g/dL (ref 13.0–17.0)
MCH: 27 pg (ref 26.0–34.0)
MCHC: 35.1 g/dL (ref 30.0–36.0)
MCV: 77 fL — ABNORMAL LOW (ref 80.0–100.0)
Platelets: 201 10*3/uL (ref 150–400)
RBC: 5.25 MIL/uL (ref 4.22–5.81)
RDW: 13.7 % (ref 11.5–15.5)
WBC: 5.9 10*3/uL (ref 4.0–10.5)
nRBC: 0 % (ref 0.0–0.2)

## 2021-09-24 LAB — LIPASE, BLOOD: Lipase: 32 U/L (ref 11–51)

## 2021-09-24 IMAGING — CT CT ABD-PELV W/ CM
2 of 5 series · 16 of 46 positions shown, 18 images · IV contrast (omnipaque)
Comparison: None.

CLINICAL DATA: Abdominal pain.  Hernia suspected.

EXAM:
CT ABDOMEN AND PELVIS WITH CONTRAST
TECHNIQUE: Multidetector CT imaging of the abdomen and pelvis was performed
using the standard protocol following bolus administration of
intravenous contrast.
CONTRAST:  100mL OMNIPAQUE IOHEXOL 350 MG/ML SOLN

[Series 3: a/p w/ 5mm · axial · 0.76mm/px · z∈[+895,+1310]mm · 13 of 93 slices shown, 15 images]
[im 5/93  soft-tissue]
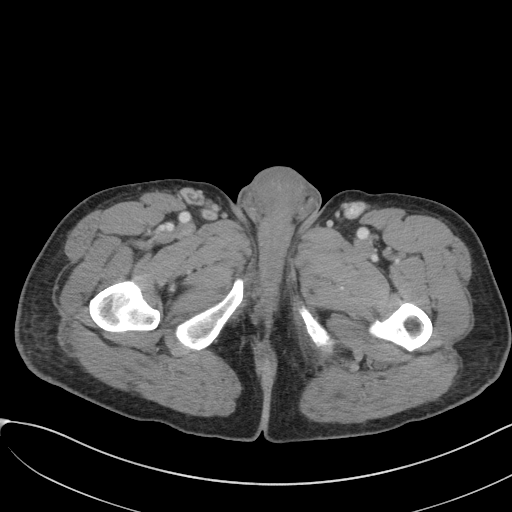
[im 5/93  bone]
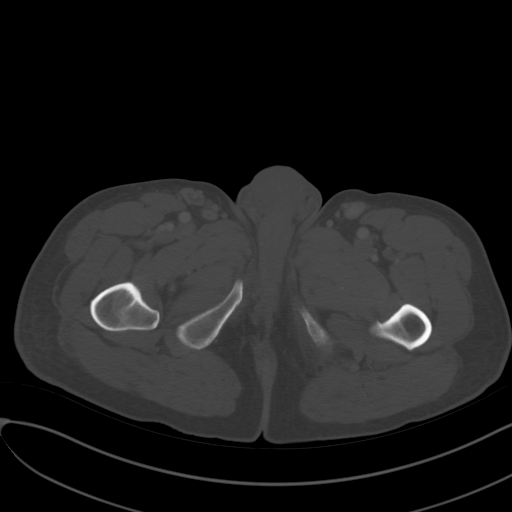
[im 15/93  soft-tissue]
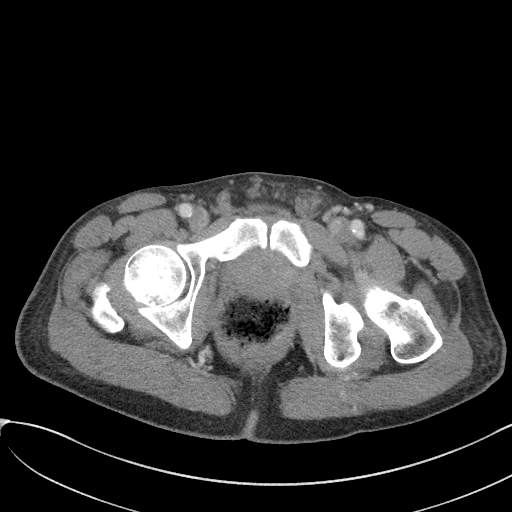
[im 20/93  soft-tissue]
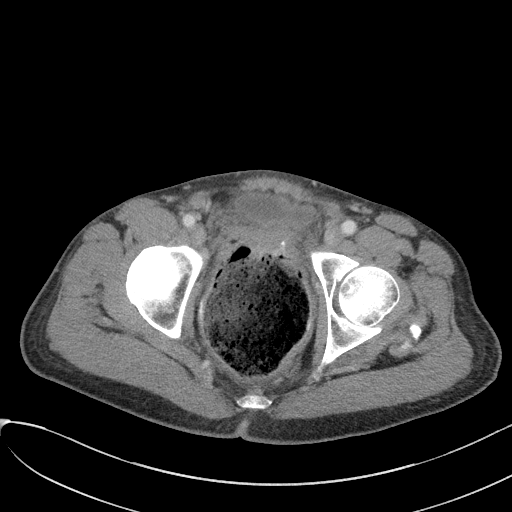
[im 25/93  soft-tissue]
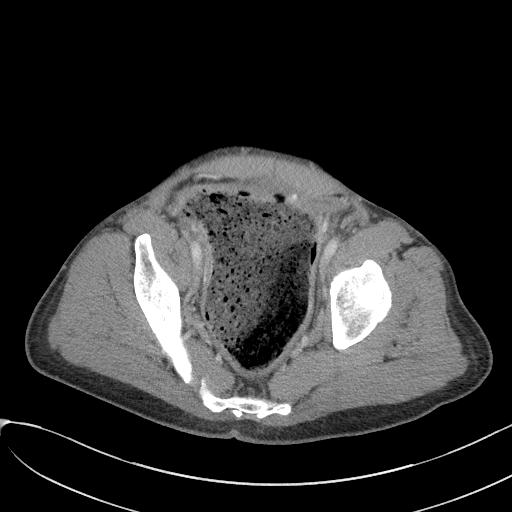
[im 34/93  soft-tissue]
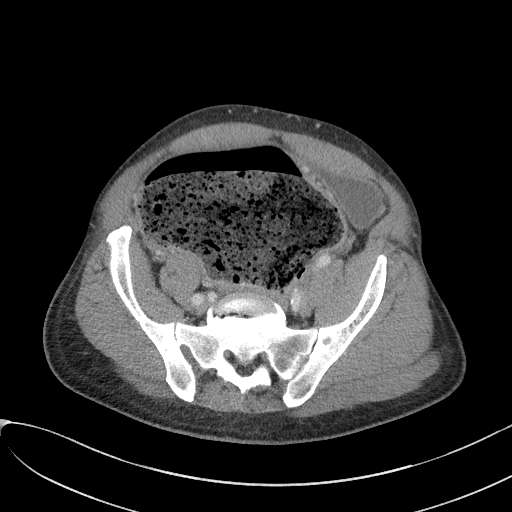
[im 39/93  soft-tissue]
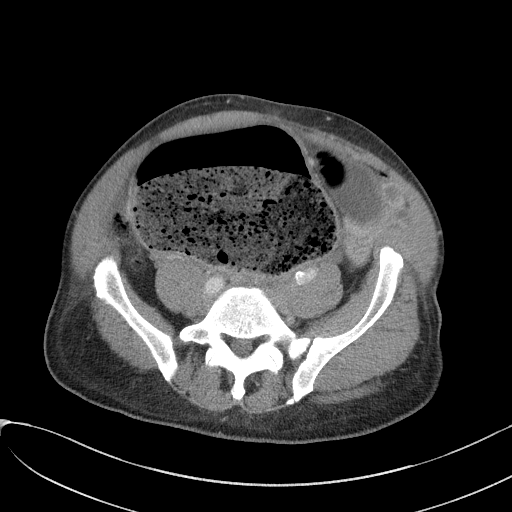
[im 49/93  soft-tissue]
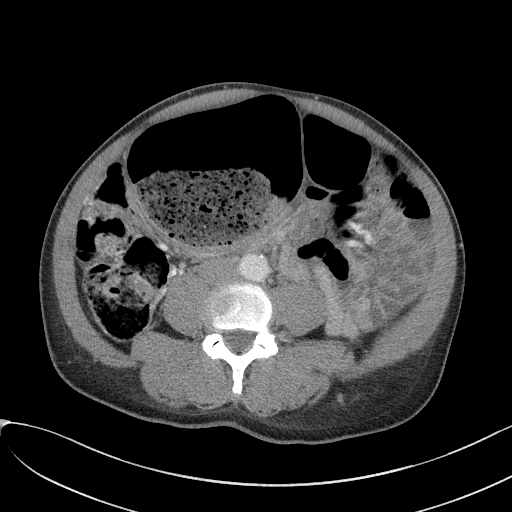
[im 54/93  soft-tissue]
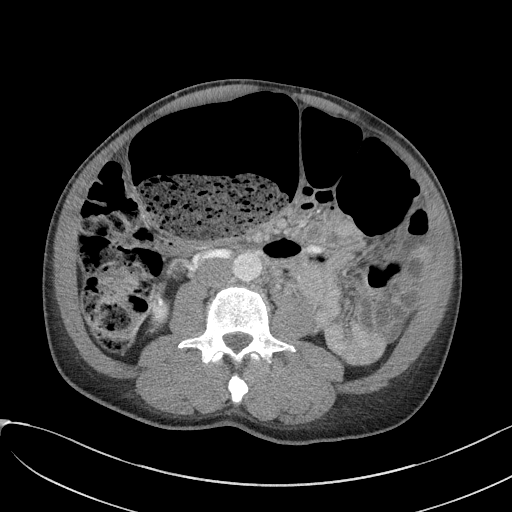
[im 59/93  soft-tissue]
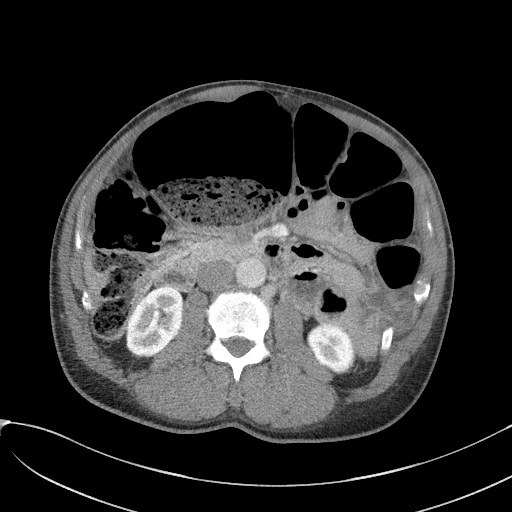
[im 59/93  bone]
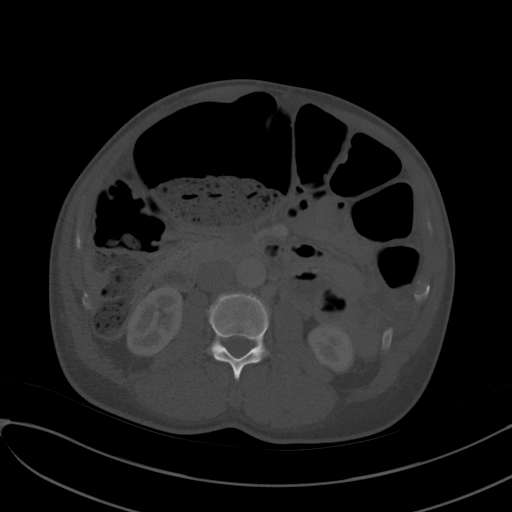
[im 68/93  soft-tissue]
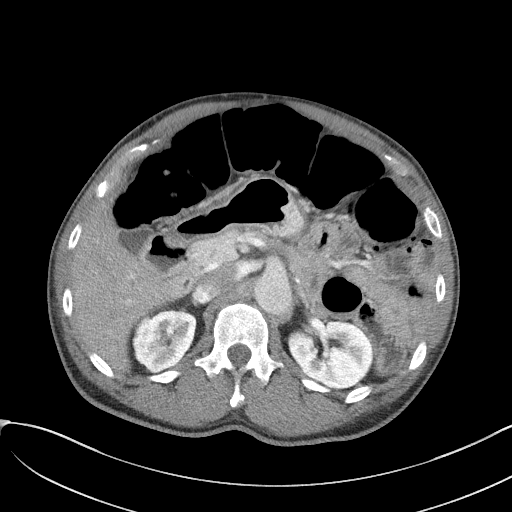
[im 73/93  soft-tissue]
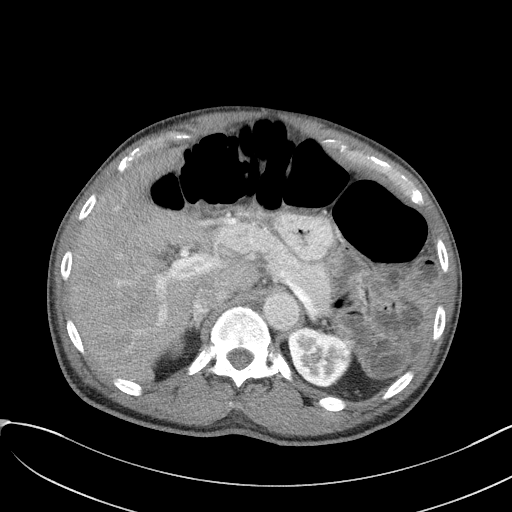
[im 78/93  soft-tissue]
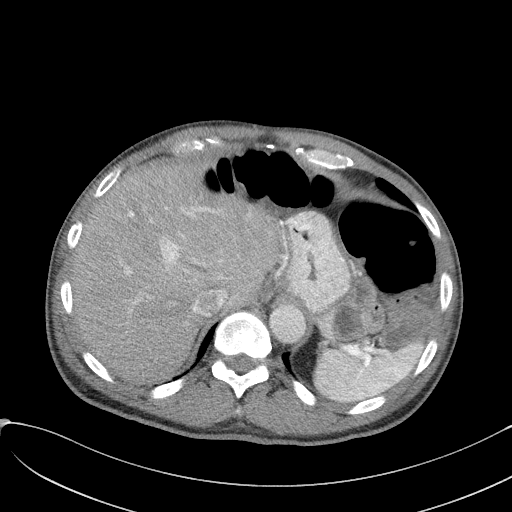
[im 88/93  soft-tissue]
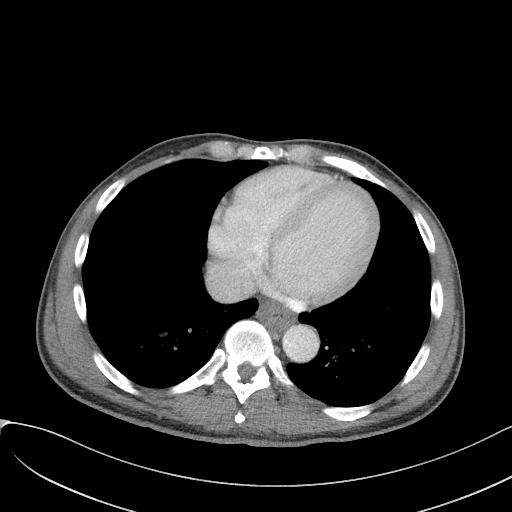

[Series 8: a/p w/ cor · coronal · 0.82mm/px · 3 of 135 slices shown]
[im 45/135  soft-tissue]
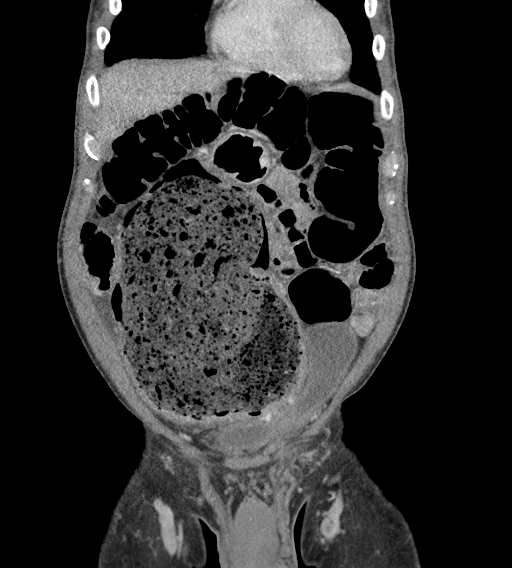
[im 60/135  soft-tissue]
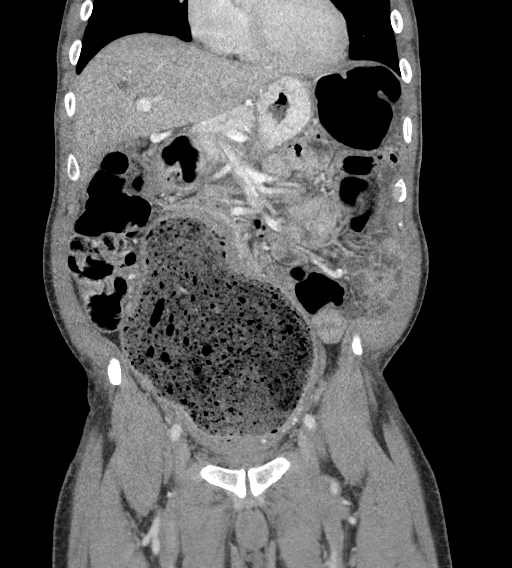
[im 75/135  soft-tissue]
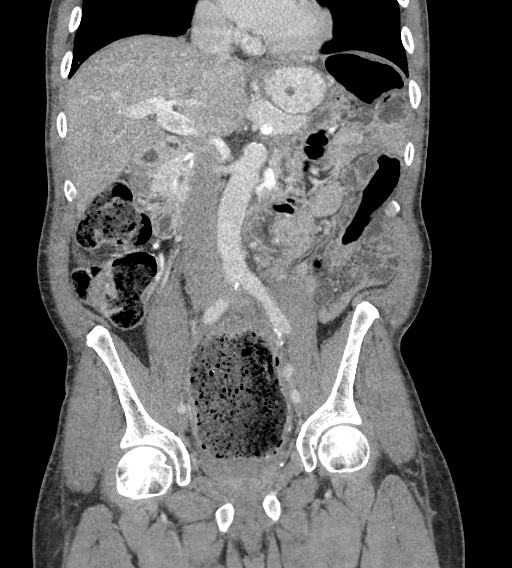

[16 of 46 positions shown; findings below may reference images not displayed]

FINDINGS: Lower chest: No acute abnormality. Circumferential distal esophageal
wall thickening and hypodensity. Query associated tiny hiatal
hernia.

Hepatobiliary: Multiple subcentimeter hypodensities ([DATE]). Vague
1.4 cm hyperdensity within the right hepatic lobe ([DATE]). No
gallstones, gallbladder wall thickening, or pericholecystic fluid.
No biliary dilatation.

Pancreas: No focal lesion. Normal pancreatic contour. No surrounding
inflammatory changes. No main pancreatic ductal dilatation.

Spleen: Normal in size without focal abnormality.

Adrenals/Urinary Tract:

No adrenal nodule bilaterally.

Bilateral kidneys enhance symmetrically.

No hydronephrosis. No hydroureter.

The urinary bladder is unremarkable.

Stomach/Bowel: Stomach is within normal limits. No evidence of bowel
wall thickening or dilatation. Increased stool burden within the
rectosigmoid colon with dilatation of the sigmoid colon measuring up
to 14 cm. Limited evaluation of the distal rectum due to under
distension. Appendix appears normal.

Vascular/Lymphatic: No abdominal aorta or iliac aneurysm. Mild
atherosclerotic plaque of the aorta and its branches. No abdominal,
pelvic, or inguinal lymphadenopathy.

Reproductive: Prostate is unremarkable.

Other: No intraperitoneal free fluid. No intraperitoneal free gas.
No organized fluid collection.

Musculoskeletal:

No abdominal wall hernia or abnormality.

No suspicious lytic or blastic osseous lesions. No acute displaced
fracture. All 5 S1 intervertebral disc space vacuum phenomenon.
IMPRESSION: 1. Increased stool burden with fecal impaction: rectosigmoid colon
dilated up to 14 cm. No findings of ischemia or perforation. No
stercoral colitis or volvulus identified.
2. Distal esophageal wall thickening and hypodensity. Query
associated tiny hiatal hernia. Correlate with signs and symptoms of
esophagitis. Underlying malignancy not excluded.
3. Vague indeterminate 1.4 cm hyperdensity within the right hepatic
lobe. Finding may represent a flash filling hemangioma; however,
malignancy is not excluded. Recommend nonemergent MRI liver
protocol. When the patient is clinically stable and able to follow
directions and hold their breath (preferably as an outpatient)
further evaluation with dedicated abdominal MRI should be
considered.

## 2021-09-24 MED ORDER — PANTOPRAZOLE SODIUM 40 MG PO TBEC: 40.0000 mg | DELAYED_RELEASE_TABLET | Freq: Every day | ORAL | 0 refills | Status: DC

## 2021-09-24 MED ORDER — IOHEXOL 350 MG/ML SOLN
100.0000 mL | Freq: Once | INTRAVENOUS | Status: AC | PRN
  Administered 2021-09-24: 100 mL via INTRAVENOUS

## 2021-09-24 NOTE — ED Triage Notes (Signed)
Pt reports protruding umbilicus x3 years since having a colonoscopy. Reports increasing pain and swelling to this area. A doctor at the [facility] told him he has a hernia. Additionally has been nauseas since last night, no vomiting, and has upset stomach when eating anything spicy (ongoing for a few months).

## 2021-09-24 NOTE — ED Provider Notes (Signed)
[facility] EMERGENCY DEPARTMENT Provider Note   CSN: [id number] Arrival date & time: 09/24/21  1751     History Chief Complaint  Patient presents with   Hernia    Calvin Jackson is a 60 y.o. male.  HPI  60 year old male with a history of diabetes hypertension and dyslipidemia, the patient presents today with a complaint of abdominal discomfort.  He reports that he has had a hernia since he was much younger but over the last couple of years she has had some increasing nausea when he eats spicy foods.  He states that his abdominal hernia is not getting any better, he took a laxative yesterday because he has not had a bowel movement in a while and had a little bit of liquid stool last night.  He has not had any today.  He denies any back pain fevers or chills.  Symptoms are persistent and gradually worsening  Past Medical History:  Diagnosis Date   Diabetes mellitus with neurological manifestation (HCC)    Dyslipidemia    Essential hypertension    Marijuana abuse 10/15/2020   Stroke (HCC)    Tobacco dependence 10/15/2020    Patient Active Problem List   Diagnosis Date Noted   Acute CVA (cerebrovascular accident) (HCC) 10/15/2020   Marijuana abuse 10/15/2020   Tobacco dependence 10/15/2020   Essential hypertension    Dyslipidemia    Diabetes mellitus with neurological manifestation (HCC)     No past surgical history on file.     No family history on file.  Social History   Tobacco Use   Smoking status: Every Day    Packs/day: 0.50    Years: 47.00    Pack years: 23.50    Types: Cigarettes   Smokeless tobacco: Never   Tobacco comments:    declines patch - reports vivid dreams  Substance Use Topics   Alcohol use: Not Currently   Drug use: Yes    Types: Marijuana    Comment: occasional use    Home Medications Prior to Admission medications   Medication Sig Start Date End Date Taking? Authorizing Provider  pantoprazole (PROTONIX) 40 MG  tablet Take 1 tablet (40 mg total) by mouth daily. 09/24/21  Yes Eber Hong, MD  aspirin 81 MG chewable tablet Chew 1 tablet (81 mg total) by mouth daily. 10/18/20   Lewie Chamber, MD  atorvastatin (LIPITOR) 40 MG tablet Take 1 tablet (40 mg total) by mouth daily. 10/18/20   Lewie Chamber, MD  cyclobenzaprine (FLEXERIL) 10 MG tablet Take 10 mg by mouth at bedtime as needed for muscle spasms.  08/31/20   [provider]  famotidine (PEPCID) 40 MG tablet Take 1 tablet (40 mg total) by mouth every evening. 10/18/20 01/16/21  Lewie Chamber, MD  FLOMAX 0.4 MG CAPS capsule Take 0.4 mg by mouth at bedtime. 08/31/20   [provider]  hydrochlorothiazide (HYDRODIURIL) 25 MG tablet Take 25 mg by mouth daily. 08/31/20   [provider]  losartan (COZAAR) 100 MG tablet Take 100 mg by mouth daily. 08/31/20   [provider]  metFORMIN (GLUCOPHAGE) 500 MG tablet Take 500 mg by mouth daily. 08/31/20   [provider]  MIRALAX 17 GM/SCOOP powder Take 17 g by mouth daily as needed for mild constipation.  05/27/20   [provider]    Allergies    Patient has no known allergies.  Review of Systems   Review of Systems  All other systems reviewed and are negative.  Physical Exam Updated Vital Signs BP (!) 193/110 (BP Location: Left Arm)   Pulse (!) 57   Temp 98.6 F (37 C) (Oral)   Resp 16   SpO2 99%   Physical Exam Vitals and nursing note reviewed.  Constitutional:      General: He is not in acute distress.    Appearance: He is well-developed.  HENT:     Head: Normocephalic and atraumatic.     Mouth/Throat:     Pharynx: No oropharyngeal exudate.  Eyes:     General: No scleral icterus.       Right eye: No discharge.        Left eye: No discharge.     Conjunctiva/sclera: Conjunctivae normal.     Pupils: Pupils are equal, round, and reactive to light.  Neck:     Thyroid: No thyromegaly.     Vascular: No JVD.  Cardiovascular:     Rate and  Rhythm: Normal rate and regular rhythm.     Heart sounds: Normal heart sounds. No murmur heard.   No friction rub. No gallop.  Pulmonary:     Effort: Pulmonary effort is normal. No respiratory distress.     Breath sounds: Normal breath sounds. No wheezing or rales.  Abdominal:     General: Bowel sounds are normal. There is no distension.     Palpations: Abdomen is soft. There is no mass.     Tenderness: There is no abdominal tenderness.     Comments: The patient is overweight, he has a tense abdomen without any tenderness whatsoever.  There is a very small umbilical is easily reducible and an umbilical defect can be palpated below that.  There is absolutely no tenderness or signs of incarceration or strangulation.  Musculoskeletal:        General: No tenderness. Normal range of motion.     Cervical back: Normal range of motion and neck supple.  Lymphadenopathy:     Cervical: No cervical adenopathy.  Skin:    General: Skin is warm and dry.     Findings: No erythema or rash.  Neurological:     Mental Status: He is alert.     Coordination: Coordination normal.  Psychiatric:        Behavior: Behavior normal.    ED Results / Procedures / Treatments   Labs (all labs ordered are listed, but only abnormal results are displayed) Labs Reviewed  COMPREHENSIVE METABOLIC PANEL - Abnormal; Notable for the following components:      Result Value   Creatinine, Ser 1.50 (*)    Calcium 8.5 (*)    GFR, Estimated 52 (*)    All other components within normal limits  CBC - Abnormal; Notable for the following components:   MCV 77.0 (*)    All other components within normal limits  LIPASE, BLOOD  URINALYSIS, ROUTINE W REFLEX MICROSCOPIC    EKG None  Radiology CT ABDOMEN PELVIS W CONTRAST  Result Date: 09/24/2021 CLINICAL DATA:  Abdominal pain.  Hernia suspected. EXAM: CT ABDOMEN AND PELVIS WITH CONTRAST TECHNIQUE: Multidetector CT imaging of the abdomen and pelvis was performed using the  standard protocol following bolus administration of intravenous contrast. CONTRAST:  OMNIPAQUE IOHEXOL 350 MG/ML SOLN COMPARISON:  None. FINDINGS: Lower chest: No acute abnormality. Circumferential distal esophageal wall thickening and hypodensity. Query associated tiny hiatal hernia. Hepatobiliary: Multiple subcentimeter hypodensities (3:14-23). Vague 1.4 cm hyperdensity within the right hepatic lobe (3:23). No gallstones, gallbladder wall thickening, or pericholecystic fluid. No biliary dilatation.  Pancreas: No focal lesion. Normal pancreatic contour. No surrounding inflammatory changes. No main pancreatic ductal dilatation. Spleen: Normal in size without focal abnormality. Adrenals/Urinary Tract: No adrenal nodule bilaterally. Bilateral kidneys enhance symmetrically. No hydronephrosis. No hydroureter. The urinary bladder is unremarkable. Stomach/Bowel: Stomach is within normal limits. No evidence of bowel wall thickening or dilatation. Increased stool burden within the rectosigmoid colon with dilatation of the sigmoid colon measuring up to 14 cm. Limited evaluation of the distal rectum due to under distension. Appendix appears normal. Vascular/Lymphatic: No abdominal aorta or iliac aneurysm. Mild atherosclerotic plaque of the aorta and its branches. No abdominal, pelvic, or inguinal lymphadenopathy. Reproductive: Prostate is unremarkable. Other: No intraperitoneal free fluid. No intraperitoneal free gas. No organized fluid collection. Musculoskeletal: No abdominal wall hernia or abnormality. No suspicious lytic or blastic osseous lesions. No acute displaced fracture. All 5 S1 intervertebral disc space vacuum phenomenon. IMPRESSION: 1. Increased stool burden with fecal impaction: rectosigmoid colon dilated up to 14 cm. No findings of ischemia or perforation. No stercoral colitis or volvulus identified. 2. Distal esophageal wall thickening and hypodensity. Query associated tiny hiatal hernia. Correlate with  signs and symptoms of esophagitis. Underlying malignancy not excluded. 3. Vague indeterminate 1.4 cm hyperdensity within the right hepatic lobe. Finding may represent a flash filling hemangioma; however, malignancy is not excluded. Recommend nonemergent MRI liver protocol. When the patient is clinically stable and able to follow directions and hold their breath (preferably as an outpatient) further evaluation with dedicated abdominal MRI should be considered. Electronically Signed   By: Tish Frederickson M.D.   On: 09/24/2021 21:21    Procedures Procedures   Medications Ordered in ED Medications  iohexol (OMNIPAQUE) 350 MG/ML injection 100 mL (100 mLs Intravenous Contrast Given 09/24/21 2103)    ED Course  I have reviewed the triage vital signs and the nursing notes.  Pertinent labs & imaging results that were available during my care of the patient were reviewed by me and considered in my medical decision making (see chart for details).    MDM Rules/Calculators/A&P                           The patient has a large fecal burden, the patient has been having bowel movements in fact he had some soft stool last night.  At this time I think the right thing to do is to treat this patient with more aggressive laxative therapy to help him have a large bowel movement and evacuate his bowels.  He has no obvious signs of hernia on the CT scans.  His lab work shows a creatinine of 1.5 which is close to baseline, his liver function test is normal, blood counts are normal, no leukocytosis, normal urinalysis.  CT scan showed stool burden, he has some distal esophageal thickening for which I will treat him with a proton pump inhibitor and he will likely need to be referred to GI because of the thickness of the esophagus.  The patient is agreeable to the plan  Final Clinical Impression(s) / ED Diagnoses Final diagnoses:  Esophagitis  Hernia of abdominal cavity  Fecal impaction (HCC)  Constipation,  unspecified constipation type    Rx / DC Orders ED Discharge Orders          Ordered    pantoprazole (PROTONIX) 40 MG tablet  Daily        09/24/21 2351             Hyacinth Meeker,  Arlys John, MD 09/24/21 2351

## 2021-09-24 NOTE — Discharge Instructions (Addendum)
Please be aware that you have 2 findings on your CT scan which are very important, first you are very constipated and will need to take the following medications.  Buy a bottle of magnesium citrate and drink the whole thing in the morning, I want you to take MiraLAX twice a day until you are having regular large bowel movements. Secondly you have a finding that your esophagus, the swallowing tube has some significant inflammation.  This may just be related to acid reflux but may also be cancerous and needs to have an endoscopy.  This can be performed by a GI specialist.  I have given you the phone number for the GI person on call.  Call the office today to make the next fillable appointment, you should be seen within 2 weeks Until you are seen by the GI specialist please start taking Protonix daily I have prescribed this to your pharmacy

## 2021-09-24 NOTE — ED Provider Notes (Signed)
Emergency Medicine Provider Triage Evaluation Note  Calvin Jackson , a 60 y.o. male  was evaluated in triage.  Pt complains of  increasing pain in his umbilicus.  He has a hernia and feels like that is hurting.  N/V.  No chest pain.  No diarrhea.   Review of Systems  Positive: Abdominal pain. N/V/D Negative: Fevers  Physical Exam  BP (!) 171/99 (BP Location: Right Arm)   Pulse 63   Temp 98.6 F (37 C) (Oral)   Resp 16   SpO2 100%  Gen:   Awake, no distress   Resp:  Normal effort  MSK:   Moves extremities without difficulty  Other:  Abdomen is soft, nontender, nondistended, tender to palpation over the lower abdomen.  There is a small umbilical hernia that is not red not swollen not tender, unable to be easily reduced without pain.  Medical Decision Making  Medically screening exam initiated at 7:14 PM.  Appropriate orders placed.  [name] was informed that the remainder of the evaluation will be completed by another provider, this initial triage assessment does not replace that evaluation, and the importance of remaining in the ED until their evaluation is complete.  Will obtain labs, and CT abdomen pelvis  Note: Portions of this report may have been transcribed using voice recognition software. Every effort was made to ensure accuracy; however, inadvertent computerized transcription errors may be present     Norman Clay 09/24/21 1921    Milagros Loll, MD 09/24/21 [phone]

## 2021-09-25 NOTE — ED Notes (Signed)
Patient verbalizes understanding of discharge instructions. Prescriptions and follow-up care reviewed. Opportunity for questioning and answers were provided. Armband removed by staff, pt discharged from ED ambulatory.  

## 2021-09-28 ENCOUNTER — Emergency Department (HOSPITAL_COMMUNITY): Payer: 59

## 2021-09-28 ENCOUNTER — Inpatient Hospital Stay (HOSPITAL_COMMUNITY)
Admission: EM | Admit: 2021-09-28 | Discharge: 2021-10-06 | DRG: 069 | Disposition: A | Discharge status: Rehabilitation Facility | Payer: 59 | Attending: Internal Medicine | Admitting: Internal Medicine

## 2021-09-28 ENCOUNTER — Encounter (HOSPITAL_COMMUNITY): Payer: Self-pay | Admitting: Emergency Medicine

## 2021-09-28 DIAGNOSIS — R21 Rash and other nonspecific skin eruption: Secondary | ICD-10-CM | POA: Diagnosis present

## 2021-09-28 DIAGNOSIS — K5900 Constipation, unspecified: Secondary | ICD-10-CM | POA: Diagnosis present

## 2021-09-28 DIAGNOSIS — I613 Nontraumatic intracerebral hemorrhage in brain stem: Secondary | ICD-10-CM | POA: Diagnosis not present

## 2021-09-28 DIAGNOSIS — E119 Type 2 diabetes mellitus without complications: Secondary | ICD-10-CM | POA: Diagnosis present

## 2021-09-28 DIAGNOSIS — N179 Acute kidney failure, unspecified: Secondary | ICD-10-CM | POA: Diagnosis present

## 2021-09-28 DIAGNOSIS — G9520 Unspecified cord compression: Secondary | ICD-10-CM

## 2021-09-28 DIAGNOSIS — K209 Esophagitis, unspecified without bleeding: Secondary | ICD-10-CM | POA: Diagnosis present

## 2021-09-28 DIAGNOSIS — Z79899 Other long term (current) drug therapy: Secondary | ICD-10-CM

## 2021-09-28 DIAGNOSIS — R29810 Facial weakness: Secondary | ICD-10-CM | POA: Diagnosis present

## 2021-09-28 DIAGNOSIS — G8191 Hemiplegia, unspecified affecting right dominant side: Secondary | ICD-10-CM | POA: Diagnosis present

## 2021-09-28 DIAGNOSIS — Z7984 Long term (current) use of oral hypoglycemic drugs: Secondary | ICD-10-CM

## 2021-09-28 DIAGNOSIS — Z7902 Long term (current) use of antithrombotics/antiplatelets: Secondary | ICD-10-CM

## 2021-09-28 DIAGNOSIS — I639 Cerebral infarction, unspecified: Secondary | ICD-10-CM | POA: Diagnosis present

## 2021-09-28 DIAGNOSIS — R296 Repeated falls: Secondary | ICD-10-CM | POA: Diagnosis present

## 2021-09-28 DIAGNOSIS — I10 Essential (primary) hypertension: Secondary | ICD-10-CM | POA: Diagnosis present

## 2021-09-28 DIAGNOSIS — G8194 Hemiplegia, unspecified affecting left nondominant side: Secondary | ICD-10-CM | POA: Diagnosis present

## 2021-09-28 DIAGNOSIS — M4802 Spinal stenosis, cervical region: Secondary | ICD-10-CM | POA: Diagnosis present

## 2021-09-28 DIAGNOSIS — I6782 Cerebral ischemia: Principal | ICD-10-CM | POA: Diagnosis present

## 2021-09-28 DIAGNOSIS — E785 Hyperlipidemia, unspecified: Secondary | ICD-10-CM | POA: Diagnosis present

## 2021-09-28 DIAGNOSIS — U071 COVID-19: Secondary | ICD-10-CM | POA: Diagnosis present

## 2021-09-28 DIAGNOSIS — Z7982 Long term (current) use of aspirin: Secondary | ICD-10-CM

## 2021-09-28 DIAGNOSIS — F17210 Nicotine dependence, cigarettes, uncomplicated: Secondary | ICD-10-CM | POA: Diagnosis present

## 2021-09-28 DIAGNOSIS — Z8673 Personal history of transient ischemic attack (TIA), and cerebral infarction without residual deficits: Secondary | ICD-10-CM

## 2021-09-28 LAB — CBC
HCT: 40 % (ref 39.0–52.0)
Hemoglobin: 13.8 g/dL (ref 13.0–17.0)
MCH: 26.7 pg (ref 26.0–34.0)
MCHC: 34.5 g/dL (ref 30.0–36.0)
MCV: 77.4 fL — ABNORMAL LOW (ref 80.0–100.0)
Platelets: 201 10*3/uL (ref 150–400)
RBC: 5.17 MIL/uL (ref 4.22–5.81)
RDW: 13.8 % (ref 11.5–15.5)
WBC: 3.7 10*3/uL — ABNORMAL LOW (ref 4.0–10.5)
nRBC: 0 % (ref 0.0–0.2)

## 2021-09-28 LAB — DIFFERENTIAL
Abs Immature Granulocytes: 0.01 10*3/uL (ref 0.00–0.07)
Basophils Absolute: 0 10*3/uL (ref 0.0–0.1)
Basophils Relative: 1 %
Eosinophils Absolute: 0.2 10*3/uL (ref 0.0–0.5)
Eosinophils Relative: 6 %
Immature Granulocytes: 0 %
Lymphocytes Relative: 21 %
Lymphs Abs: 0.8 10*3/uL (ref 0.7–4.0)
Monocytes Absolute: 0.5 10*3/uL (ref 0.1–1.0)
Monocytes Relative: 12 %
Neutro Abs: 2.2 10*3/uL (ref 1.7–7.7)
Neutrophils Relative %: 60 %

## 2021-09-28 LAB — PROTIME-INR
INR: 1.2 (ref 0.8–1.2)
Prothrombin Time: 14.9 seconds (ref 11.4–15.2)

## 2021-09-28 LAB — COMPREHENSIVE METABOLIC PANEL
ALT: 16 U/L (ref 0–44)
AST: 21 U/L (ref 15–41)
Albumin: 3.6 g/dL (ref 3.5–5.0)
Alkaline Phosphatase: 50 U/L (ref 38–126)
Anion gap: 9 (ref 5–15)
BUN: 10 mg/dL (ref 8–23)
CO2: 24 mmol/L (ref 22–32)
Calcium: 8.5 mg/dL — ABNORMAL LOW (ref 8.9–10.3)
Chloride: 102 mmol/L (ref 98–111)
Creatinine, Ser: 1.32 mg/dL — ABNORMAL HIGH (ref 0.61–1.24)
GFR, Estimated: >60 mL/min (ref >60)
Glucose, Bld: 127 mg/dL — ABNORMAL HIGH (ref 70–99)
Potassium: 3 mmol/L — ABNORMAL LOW (ref 3.5–5.1)
Sodium: 135 mmol/L (ref 135–145)
Total Bilirubin: 0.9 mg/dL (ref 0.3–1.2)
Total Protein: 6.7 g/dL (ref 6.5–8.1)

## 2021-09-28 LAB — I-STAT CHEM 8, ED
BUN: 10 mg/dL (ref 8–23)
Calcium, Ion: 1.12 mmol/L — ABNORMAL LOW (ref 1.15–1.40)
Chloride: 100 mmol/L (ref 98–111)
Creatinine, Ser: 1.2 mg/dL (ref 0.61–1.24)
Glucose, Bld: 126 mg/dL — ABNORMAL HIGH (ref 70–99)
HCT: 44 % (ref 39.0–52.0)
Hemoglobin: 15 g/dL (ref 13.0–17.0)
Potassium: 3 mmol/L — ABNORMAL LOW (ref 3.5–5.1)
Sodium: 140 mmol/L (ref 135–145)
TCO2: 25 mmol/L (ref 22–32)

## 2021-09-28 LAB — APTT: aPTT: 30 seconds (ref 24–36)

## 2021-09-28 IMAGING — CT CT HEAD W/O CM
2 of 3 series · 15 of 40 positions shown, 18 images · non-contrast
Comparison: [DATE]

CLINICAL DATA: Neurological deficit.

EXAM:
CT HEAD WITHOUT CONTRAST
TECHNIQUE: Contiguous axial images were obtained from the base of the skull
through the vertex without intravenous contrast.

[Series 4: head 5.0 h30s · axial · 0.43mm/px · z∈[-88,+42]mm · 12 of 32 slices shown, 15 images]
[im 3/32  brain]
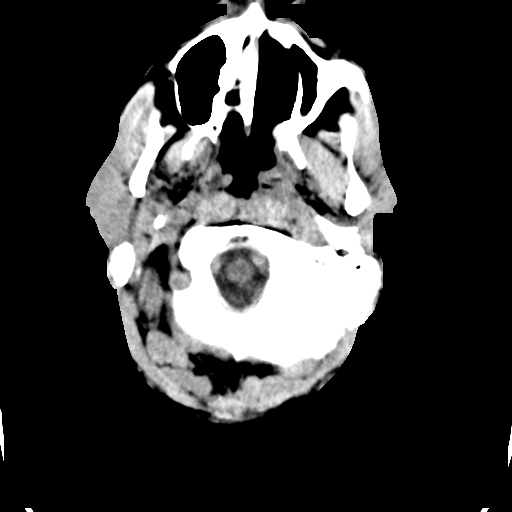
[im 3/32  bone]
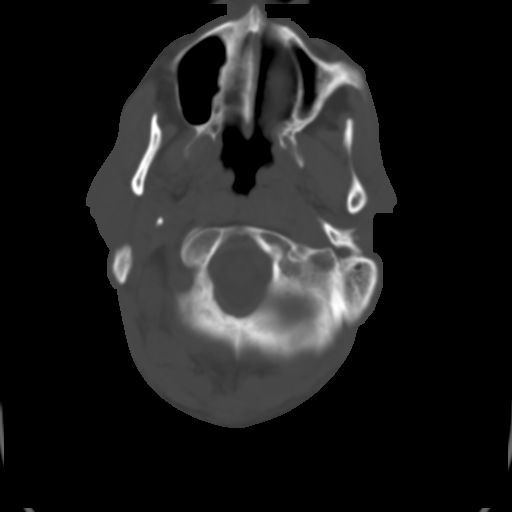
[im 5/32  brain]
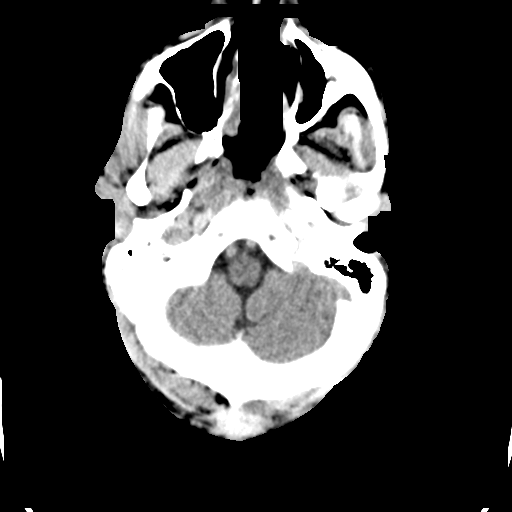
[im 7/32  brain]
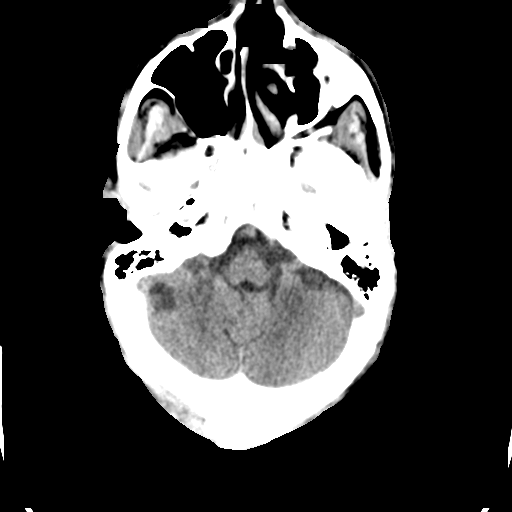
[im 10/32  brain]
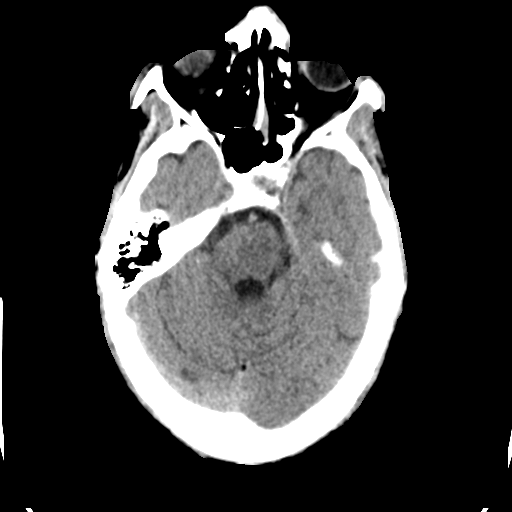
[im 12/32  brain]
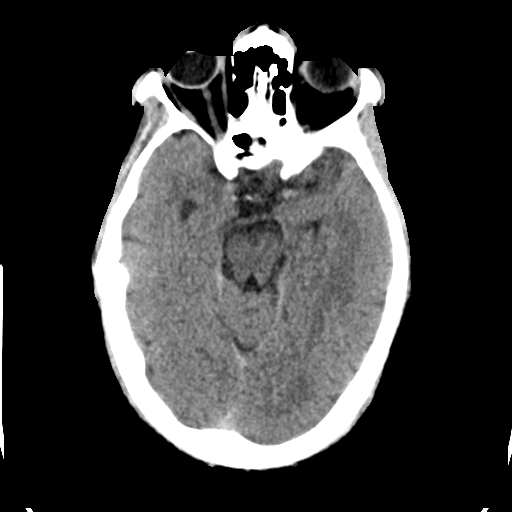
[im 12/32  bone]
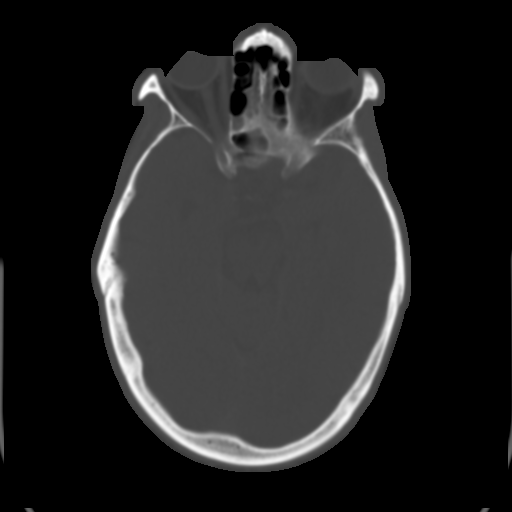
[im 14/32  brain]
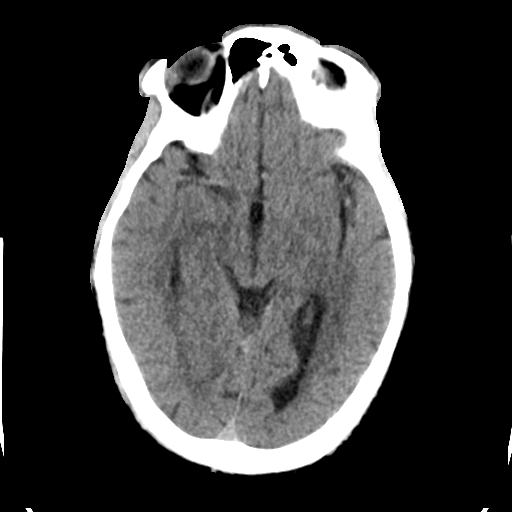
[im 18/32  brain]
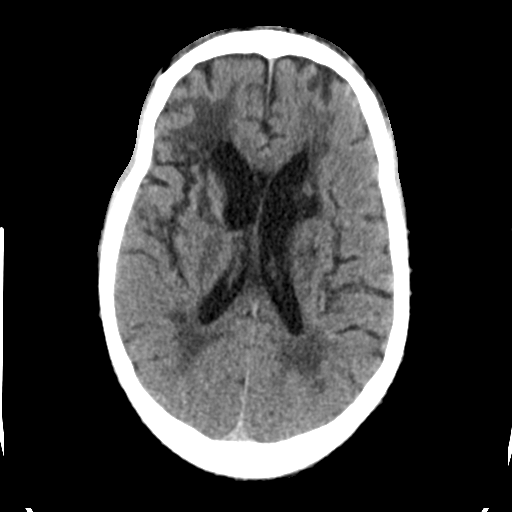
[im 20/32  brain]
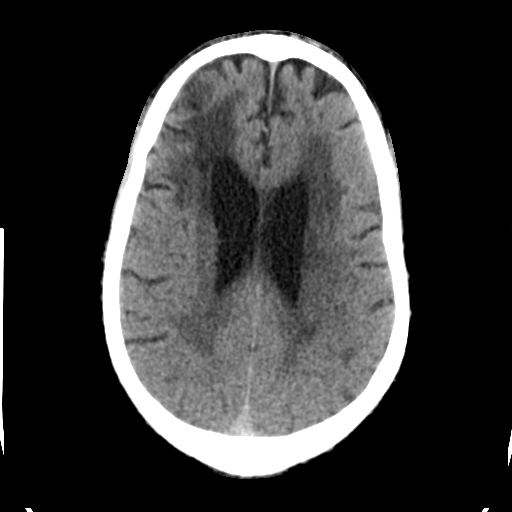
[im 22/32  brain]
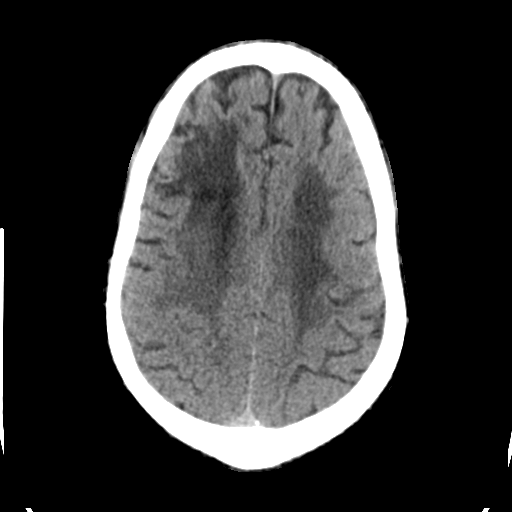
[im 22/32  bone]
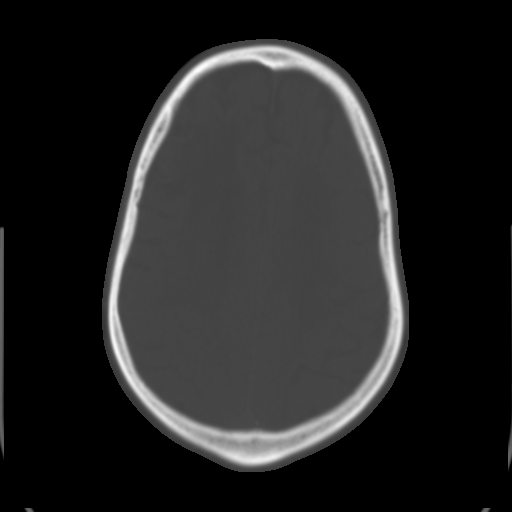
[im 25/32  brain]
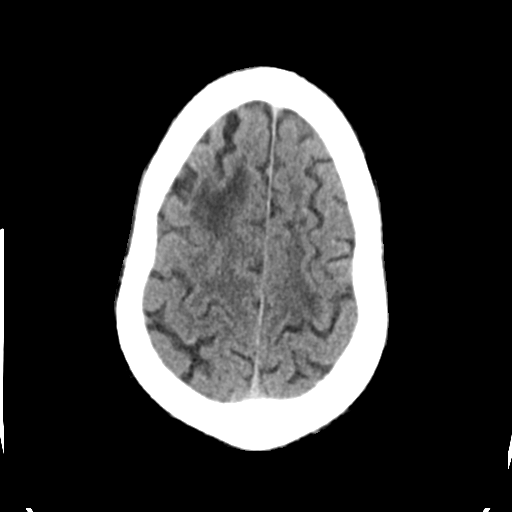
[im 27/32  brain]
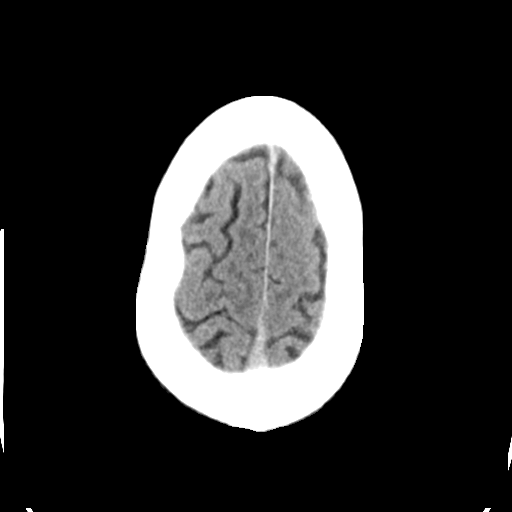
[im 29/32  brain]
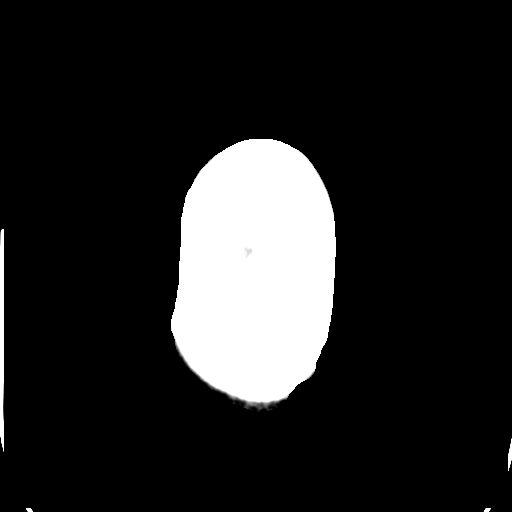

[Series 5: head 3.0 mpr cor · coronal · 0.31mm/px · 3 of 71 slices shown]
[im 18/71  brain]
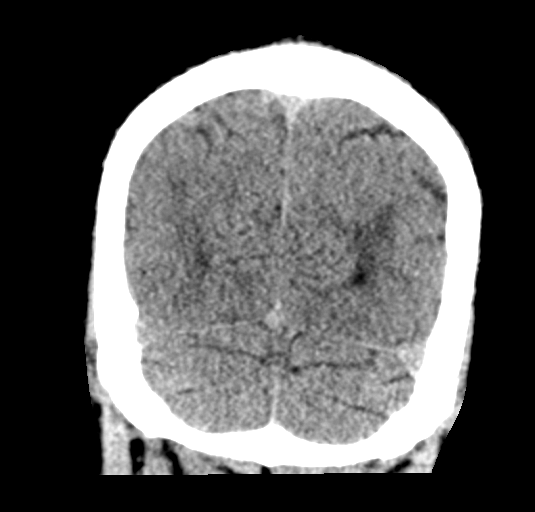
[im 36/71  brain]
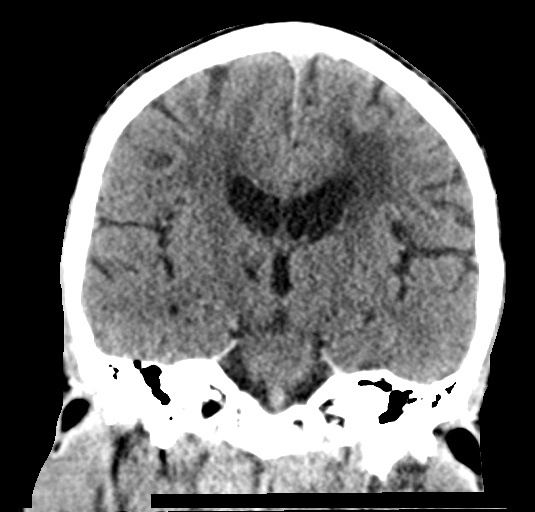
[im 53/71  brain]
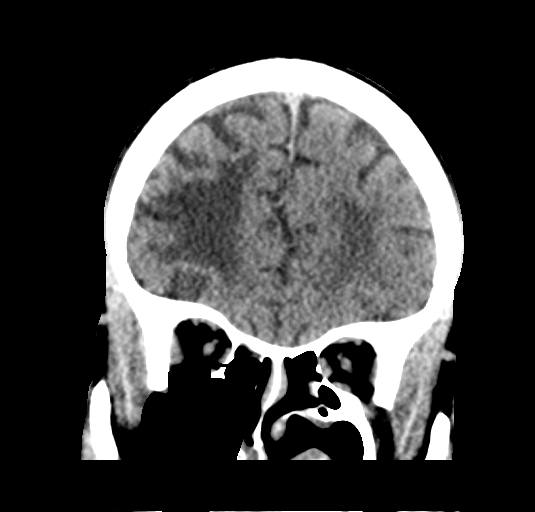

[15 of 40 positions shown; findings below may reference images not displayed]

FINDINGS: Brain: There is mild cerebral atrophy with widening of the
extra-axial spaces and ventricular dilatation.
There are areas of decreased attenuation within the white matter
tracts of the supratentorial brain, consistent with microvascular
disease changes.

Multiple bilateral basal ganglia lacunar infarcts are seen. An
additional small chronic infarct is seen along the right frontal
lobe and anterolateral aspect of the cerebellum on the right.

Vascular: No hyperdense vessel or unexpected calcification.

Skull: Normal. Negative for fracture or focal lesion.

Sinuses/Orbits: Chronic thickening of the wall of the left maxillary
sinus is seen.

Other: None.
IMPRESSION: 1. Generalized cerebral atrophy.
2. Multiple bilateral basal ganglia lacunar infarcts.
3. Additional small chronic infarct along the right frontal lobe and
anterolateral aspect of the right cerebellum.
4. Chronic left maxillary sinus disease.

## 2021-09-28 MED ORDER — SODIUM CHLORIDE 0.9% FLUSH: 3.0000 mL | Freq: Once | INTRAVENOUS | Status: DC

## 2021-09-28 NOTE — ED Triage Notes (Signed)
Pt reports trouble with his balance since yesterday at work. Pt walking with walker in triage. Endorses joint pain in his legs. Pt has had previous CVA and unsure of deficits besides weakness and trouble walking.

## 2021-09-29 ENCOUNTER — Emergency Department (HOSPITAL_COMMUNITY): Payer: 59

## 2021-09-29 ENCOUNTER — Observation Stay (HOSPITAL_COMMUNITY): Payer: 59

## 2021-09-29 ENCOUNTER — Other Ambulatory Visit: Payer: Self-pay

## 2021-09-29 ENCOUNTER — Observation Stay (HOSPITAL_BASED_OUTPATIENT_CLINIC_OR_DEPARTMENT_OTHER): Payer: 59

## 2021-09-29 DIAGNOSIS — I6389 Other cerebral infarction: Secondary | ICD-10-CM

## 2021-09-29 DIAGNOSIS — I639 Cerebral infarction, unspecified: Secondary | ICD-10-CM | POA: Diagnosis not present

## 2021-09-29 DIAGNOSIS — G9520 Unspecified cord compression: Secondary | ICD-10-CM | POA: Diagnosis not present

## 2021-09-29 LAB — CBG MONITORING, ED
Glucose-Capillary: 94 mg/dL (ref 70–99)
Glucose-Capillary: 91 mg/dL (ref 70–99)

## 2021-09-29 LAB — LIPID PANEL
Cholesterol: 128 mg/dL (ref 0–200)
HDL: 49 mg/dL (ref >40)
LDL Cholesterol: 70 mg/dL (ref 0–99)
Total CHOL/HDL Ratio: 2.6 RATIO
Triglycerides: 43 mg/dL (ref <150)
VLDL: 9 mg/dL (ref 0–40)

## 2021-09-29 LAB — ECHOCARDIOGRAM COMPLETE
Area-P 1/2: 1.85 cm2
Calc EF: 60.3 %
S' Lateral: 3 cm
Single Plane A2C EF: 62.4 %
Single Plane A4C EF: 57.7 %

## 2021-09-29 LAB — HIV ANTIBODY (ROUTINE TESTING W REFLEX): HIV Screen 4th Generation wRfx: NONREACTIVE

## 2021-09-29 LAB — HEMOGLOBIN A1C
Hgb A1c MFr Bld: 5.6 % (ref 4.8–5.6)
Mean Plasma Glucose: 114.02 mg/dL

## 2021-09-29 IMAGING — CR DG HIP (WITH OR WITHOUT PELVIS) 2-3V*R*
3 series · 3 of 3 positions shown · non-contrast
Comparison: None.

CLINICAL DATA: Fall and right hip pain.

EXAM:
DG HIP (WITH OR WITHOUT PELVIS) 2-3V RIGHT

[pelvis ap]
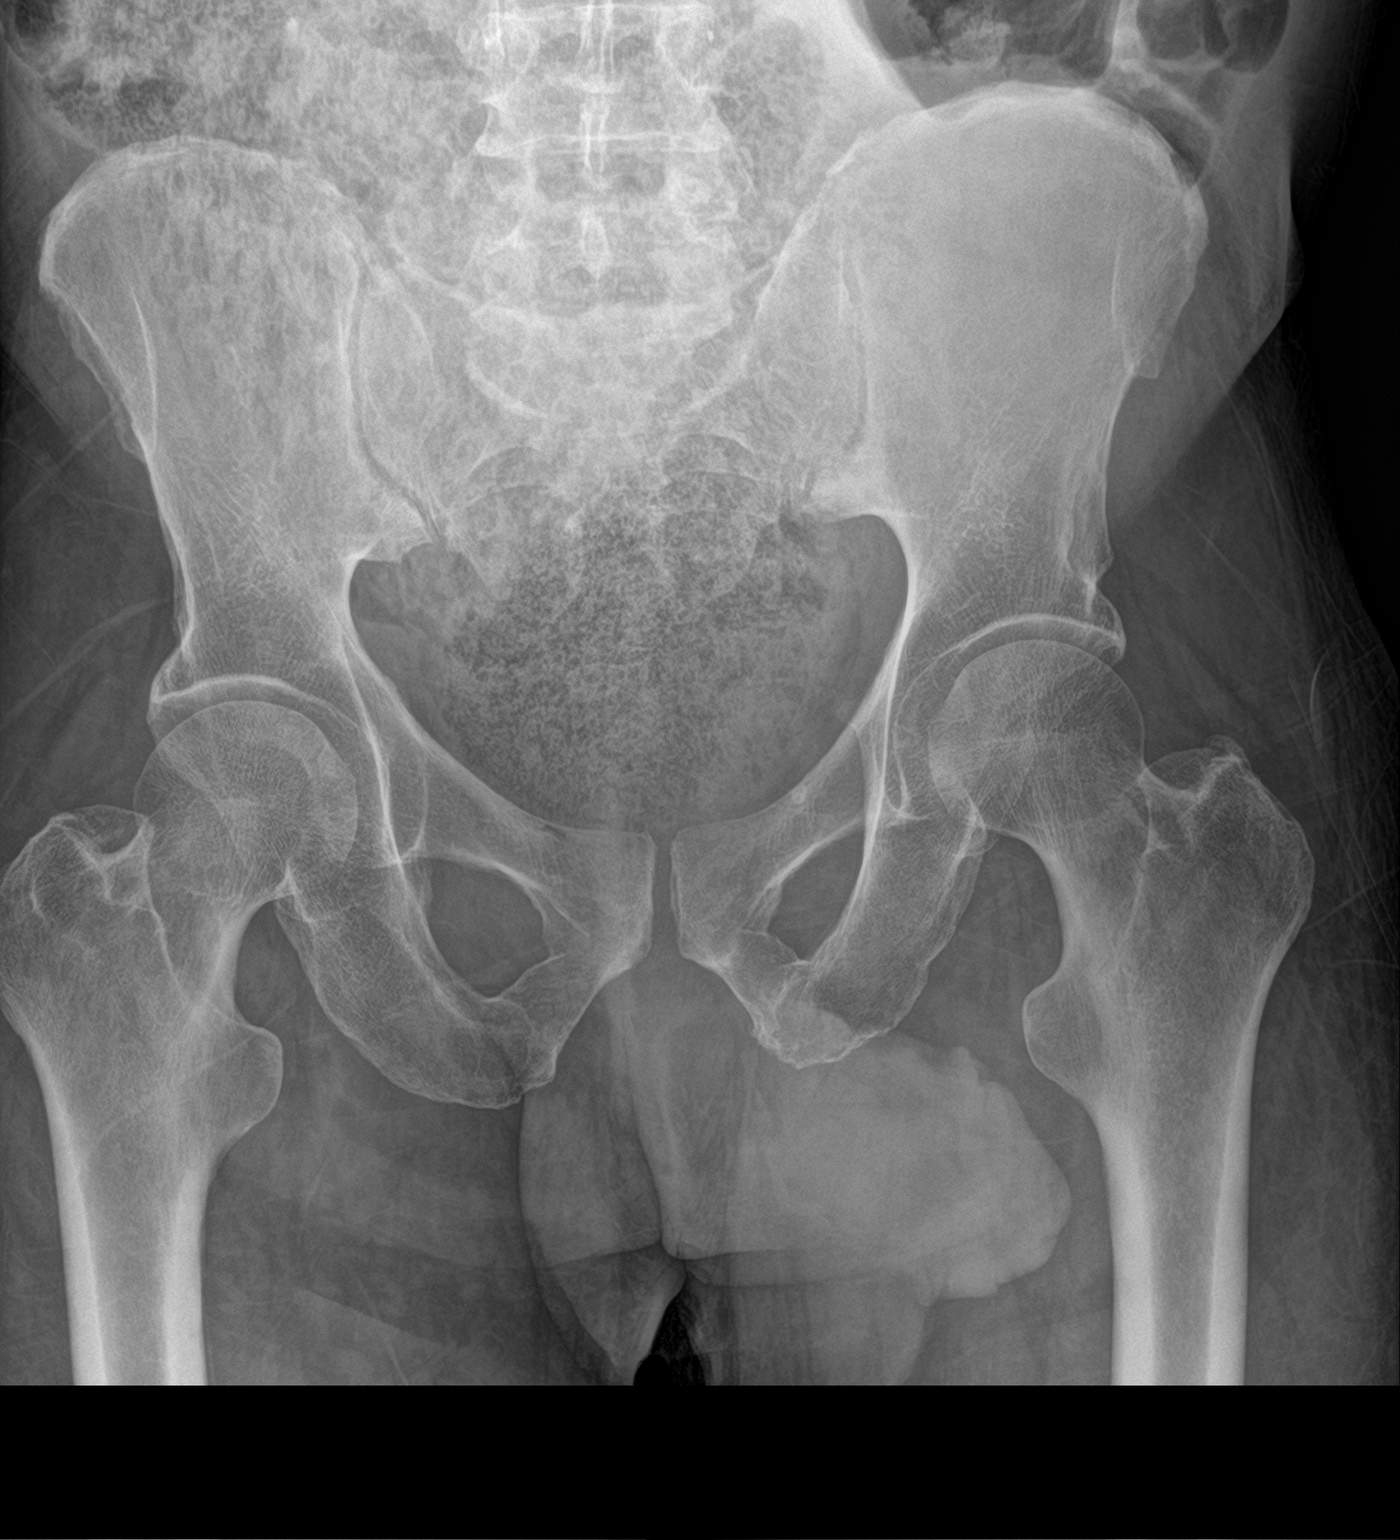

[hip ap]
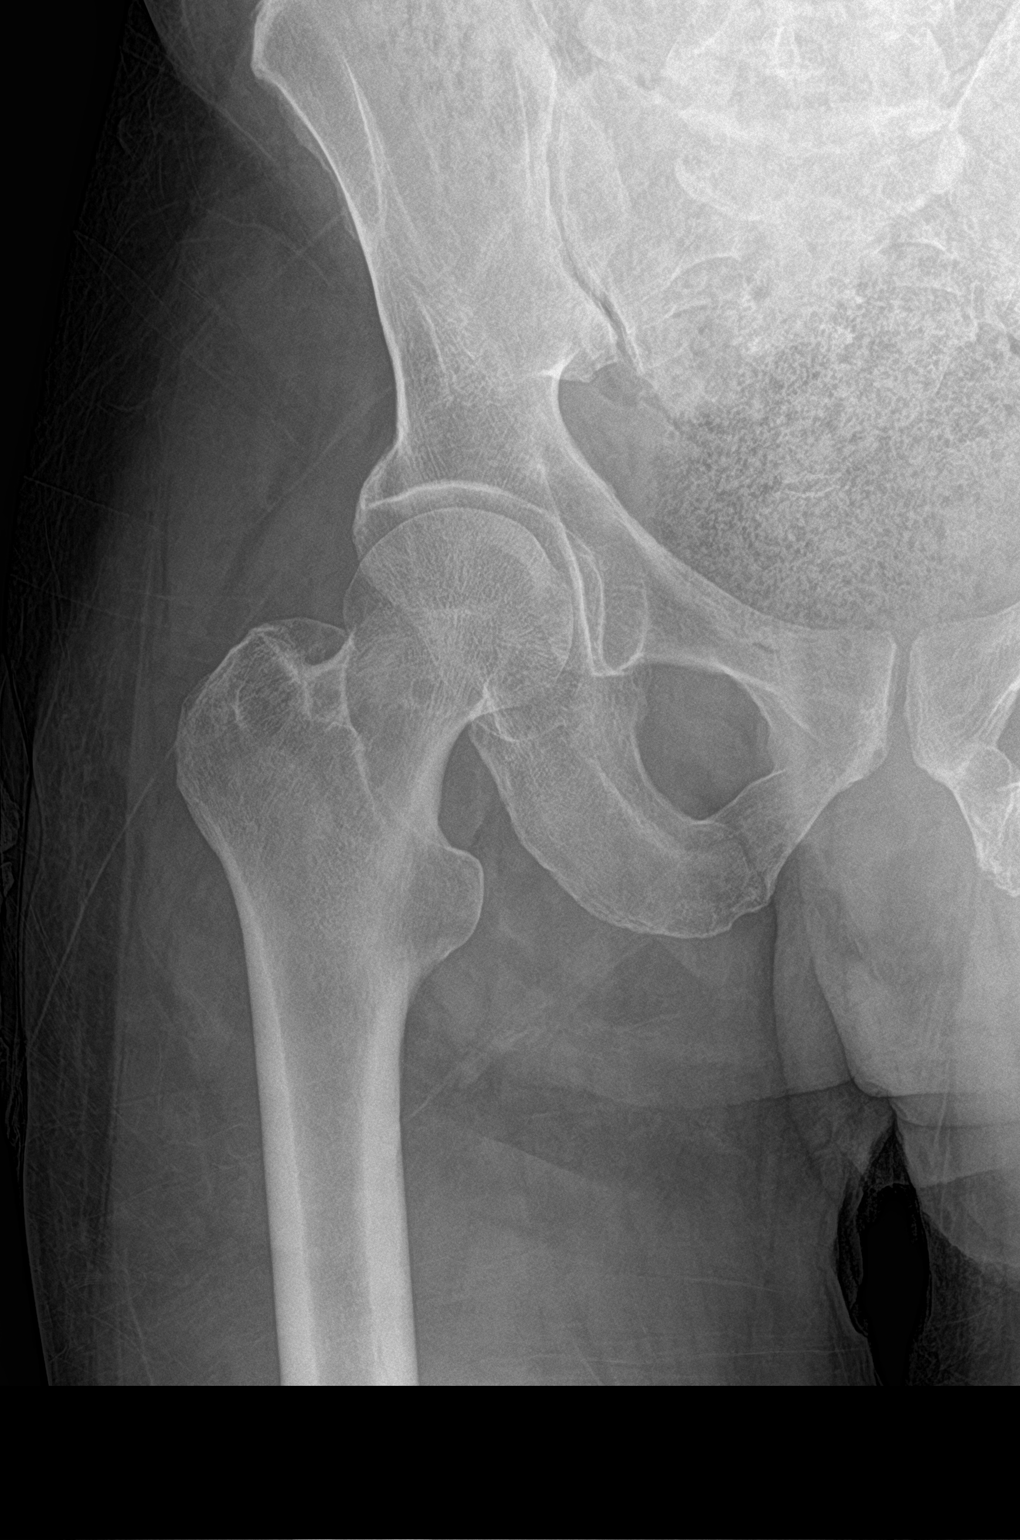

[hip lat]
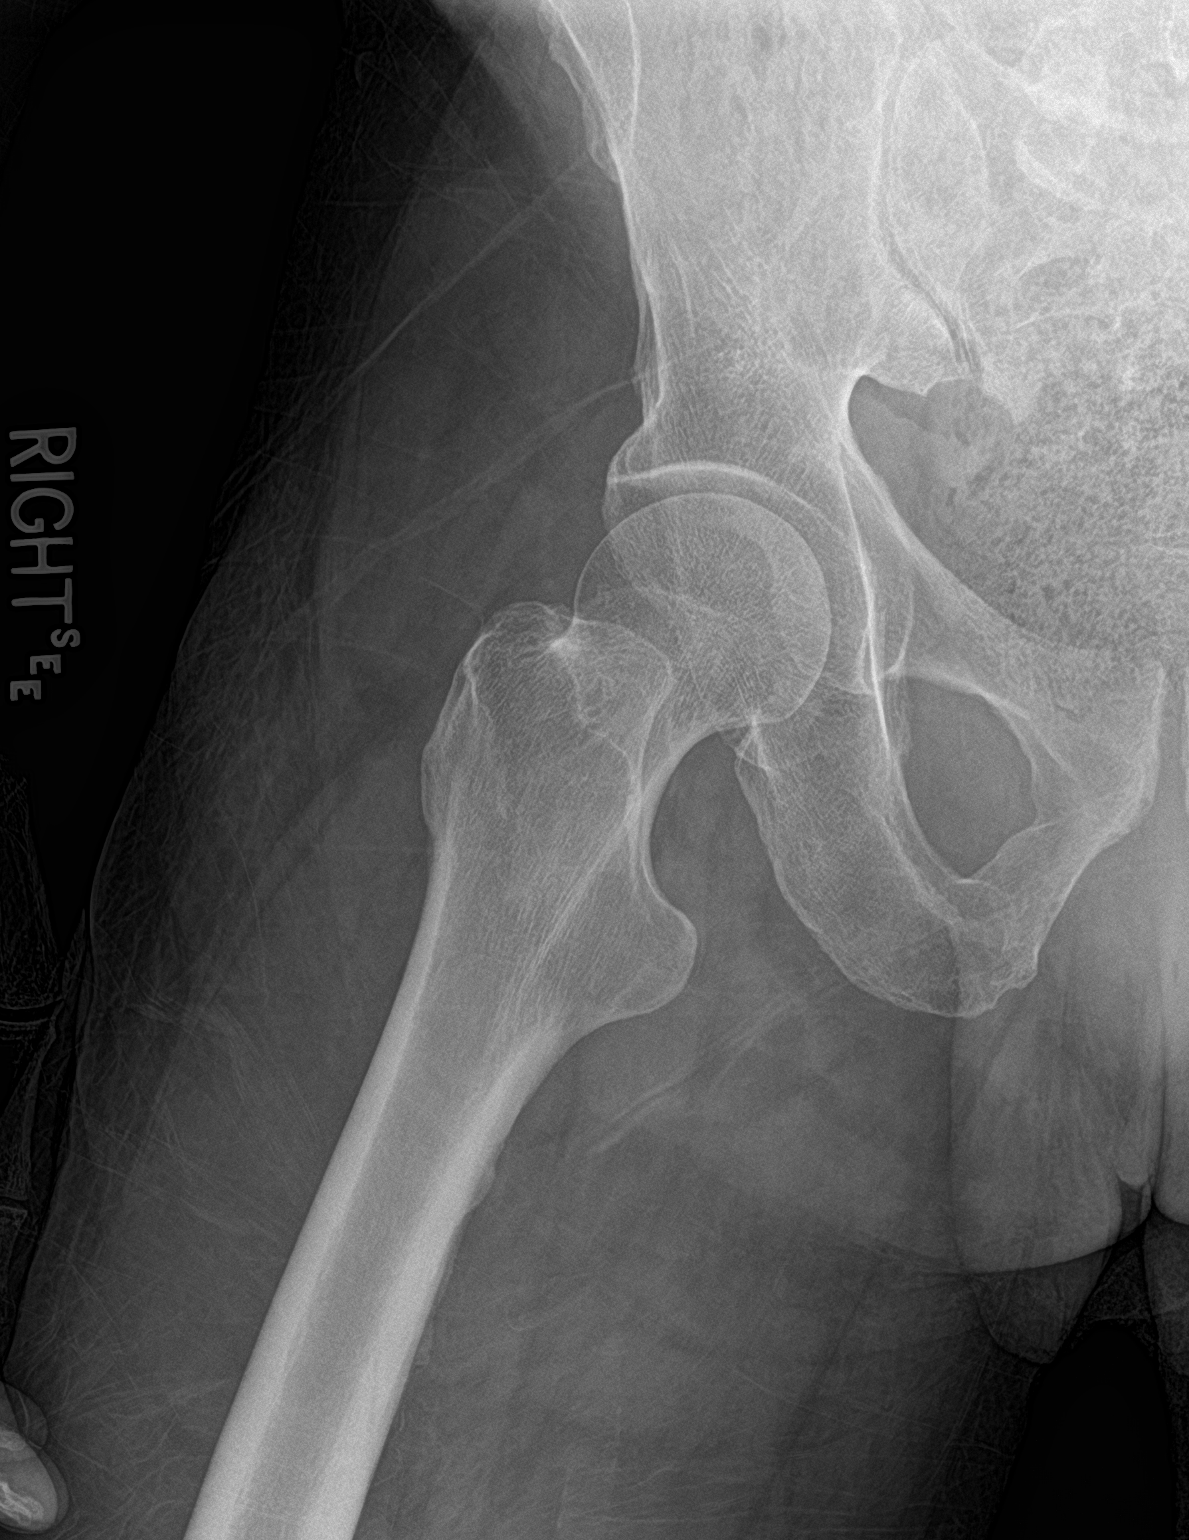

[3 of 3 positions shown; findings below may reference images not displayed]

FINDINGS: No acute fracture or dislocation. No significant arthritic changes.
The soft tissues are unremarkable.
IMPRESSION: Negative.

## 2021-09-29 IMAGING — MR MR HEAD W/O CM
9 of 11 series · 35 of 48 positions shown · non-contrast
Comparison: Head CT yesterday.

CLINICAL DATA: Neuro deficit, acute, stroke suspected. Weakness and
recurrent falling.

EXAM:
MRI HEAD WITHOUT CONTRAST
MRI CERVICAL SPINE WITHOUT CONTRAST
TECHNIQUE: Multiplanar, multiecho pulse sequences of the brain and surrounding
structures, and cervical spine, to include the craniocervical
junction and cervicothoracic junction, were obtained without
intravenous contrast.

[Series 3: DWI · axial · 3.0mm · 1.09mm/px · z∈[-1,+130]mm · 8 of 90 slices shown (1 of 4)]
[im 1/90]
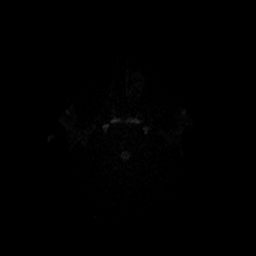
[im 10/90]
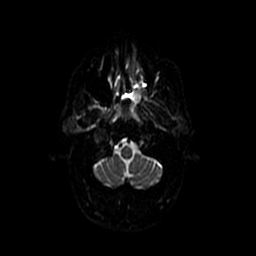
[im 30/90]
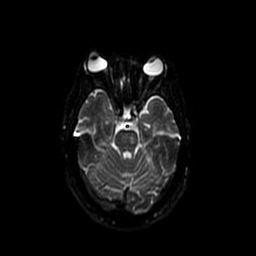
[im 40/90]
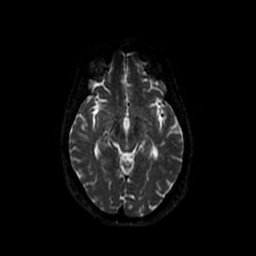
[im 50/90]
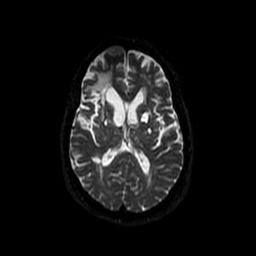
[im 60/90]
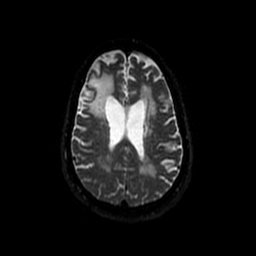
[im 80/90]
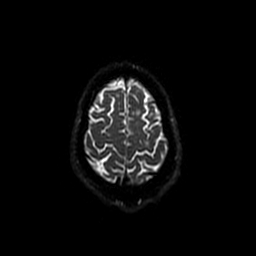
[im 90/90]
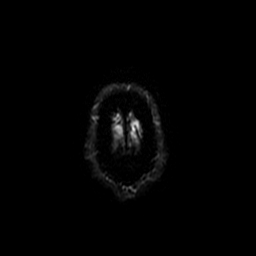

[Series 4: DWI · coronal · 5.0mm · 1.09mm/px · 8 of 72 slices shown (2 of 4)]
[im 1/72]
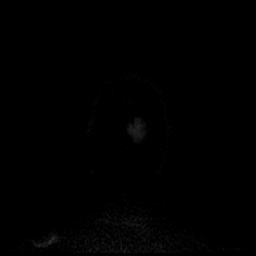
[im 11/72]
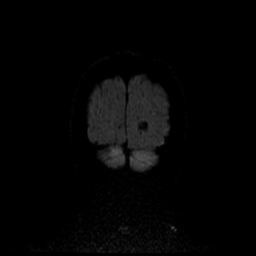
[im 21/72]
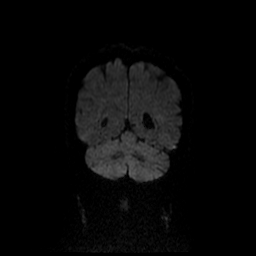
[im 31/72]
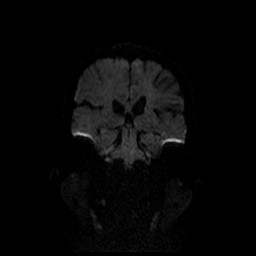
[im 41/72]
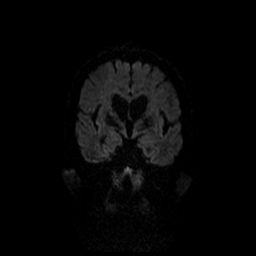
[im 51/72]
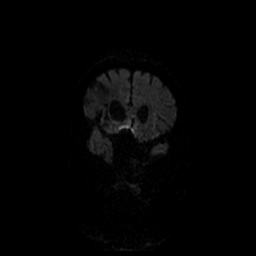
[im 61/72]
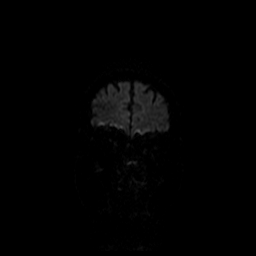
[im 72/72]
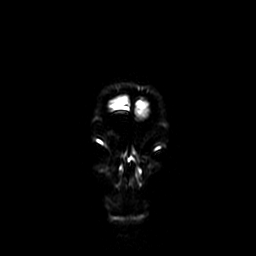

[Series 5: T1 · sagittal · 5.0mm · 0.47mm/px · 2 of 23 slices shown]
[im 1/23]
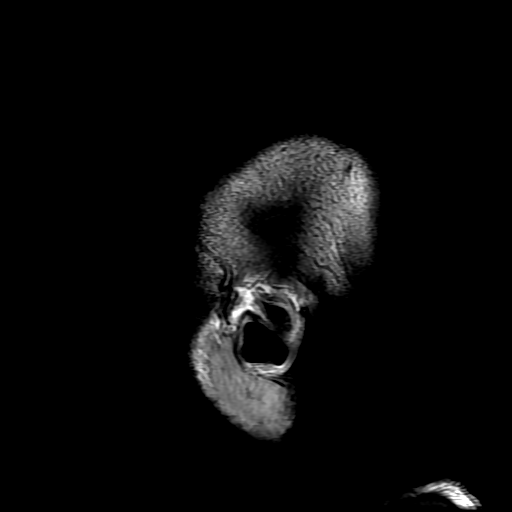
[im 23/23]
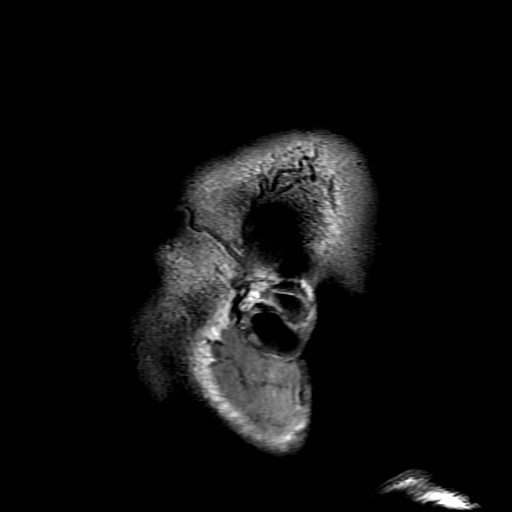

[Series 6: T2 · axial · 5.0mm · 0.43mm/px · z∈[-8,+130]mm · 2 of 24 slices shown (1 of 2)]
[im 1/24]
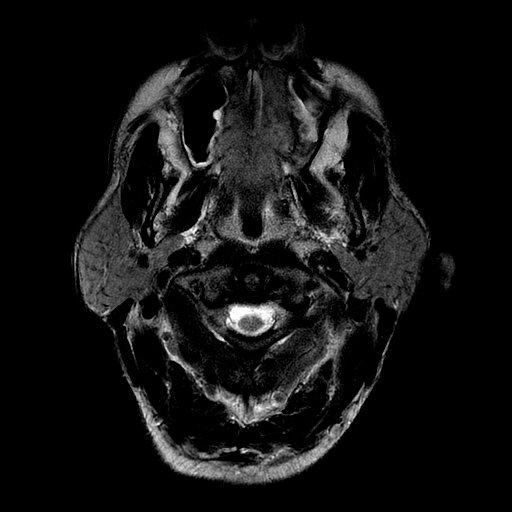
[im 24/24]
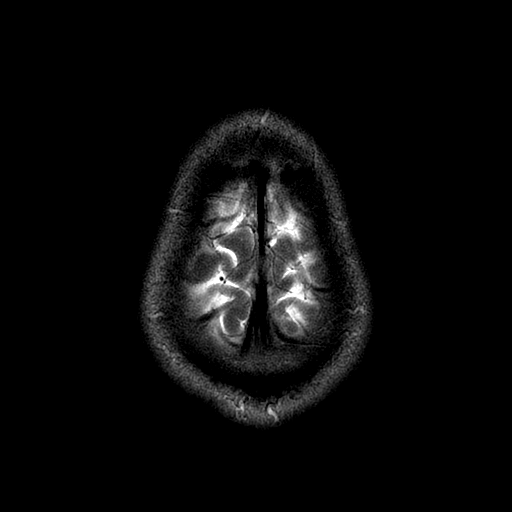

[Series 7: FLAIR · axial · 5.0mm · 0.43mm/px · 1 of 12 slices shown (1 of 2)]
[im 1/12]
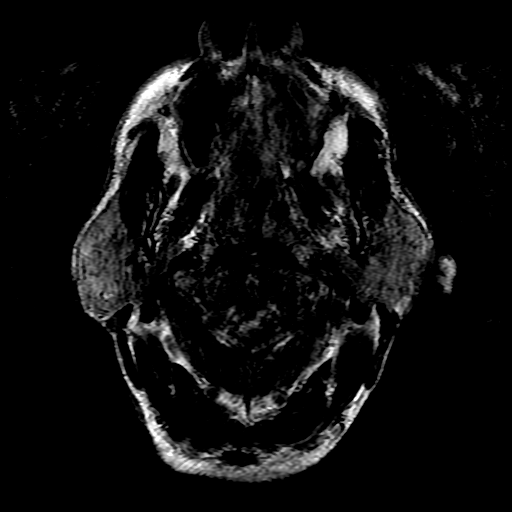

[Series 9: FLAIR · axial · 5.0mm · 0.43mm/px · z∈[-8,+130]mm · 2 of 24 slices shown (2 of 2)]
[im 1/24]
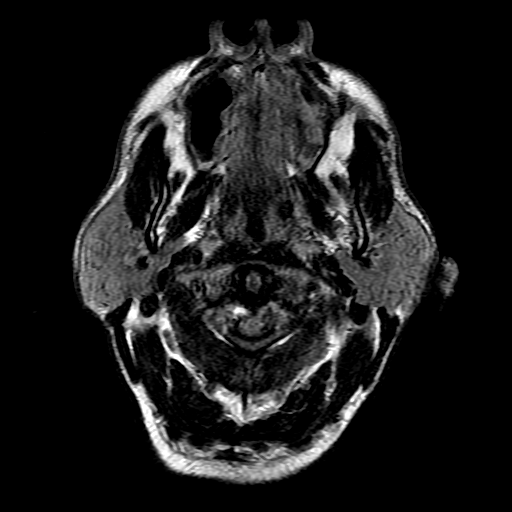
[im 24/24]
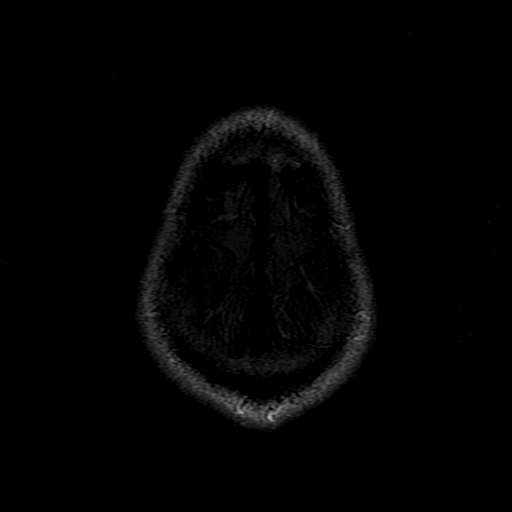

[Series 11: T2 · coronal · 5.0mm · 0.39mm/px · 3 of 28 slices shown (2 of 2)]
[im 1/28]
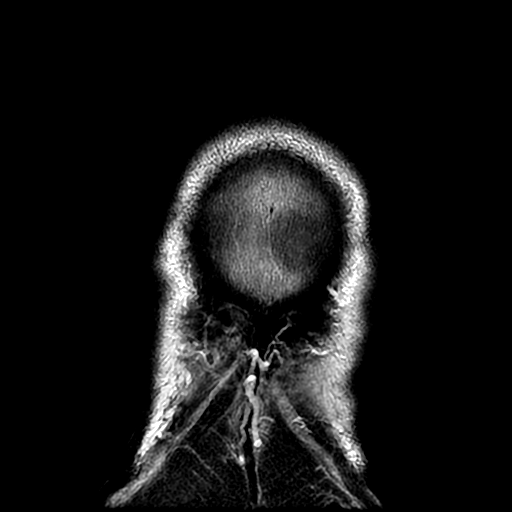
[im 14/28]
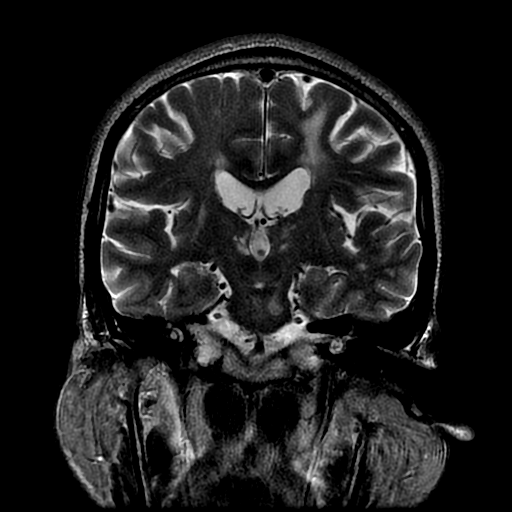
[im 28/28]
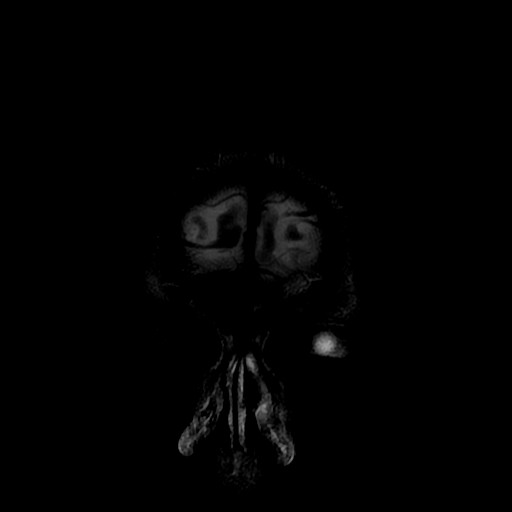

[Series 300: DWI · axial · 3.0mm · 1.09mm/px · z∈[-1,+130]mm · 5 of 45 slices shown (3 of 4)]
[im 1/45]
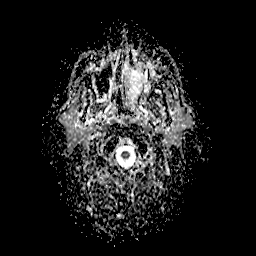
[im 12/45]
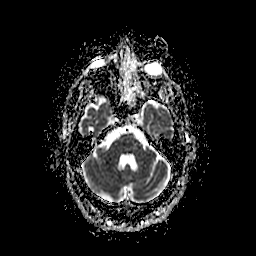
[im 23/45]
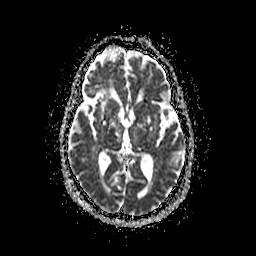
[im 34/45]
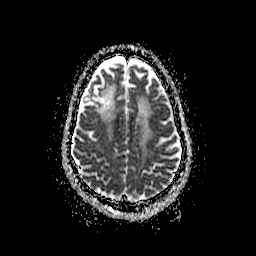
[im 45/45]
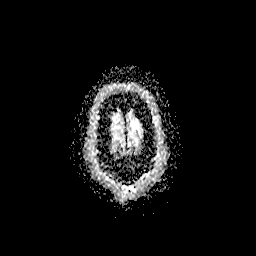

[Series 400: DWI · coronal · 5.0mm · 1.09mm/px · 4 of 36 slices shown (4 of 4)]
[im 1/36]
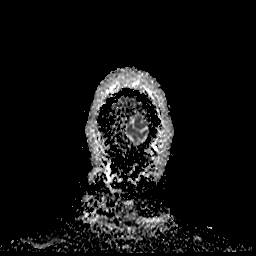
[im 12/36]
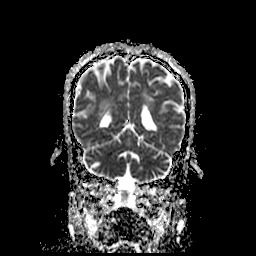
[im 24/36]
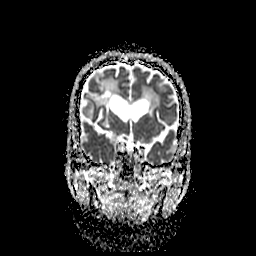
[im 36/36]
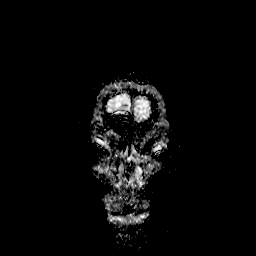

[35 of 48 positions shown; findings below may reference images not displayed]

FINDINGS: MRI HEAD FINDINGS

Brain: 7 mm acute infarction in the left anterior para median pons.
No other acute insult. Few old small vessel cerebellar infarctions.
Cerebral hemispheres show advanced chronic ischemic changes with
small vessel infarctions affecting the thalami I, basal ganglia and
throughout the cerebral hemispheric white matter. There is an old
right frontal cortical and subcortical infarction. No mass lesion,
hemorrhage, hydrocephalus or extra-axial collection.

Vascular: Major vessels at the base of the brain show flow.

Skull and upper cervical spine: Negative

Sinuses/Orbits: Inflammatory type changes of the left ethmoid
sinuses and nasal passages. Question polyposis. Orbits negative.

Other: None

MRI CERVICAL SPINE FINDINGS

Alignment: Straightening of the normal cervical lordosis.

Vertebrae: No fracture or focal bone lesion.

Cord: No primary cord lesion.  See below regarding stenosis.

Posterior Fossa, vertebral arteries, paraspinal tissues: See results
of brain MRI. Soft tissues of the neck appear unremarkable.

Disc levels: Foramen magnum is widely patent.  C1-2 is unremarkable.

C2-3: Minimal disc bulge.  No stenosis.

C3-4: Endplate osteophytes and bulging of the disc. Spinal stenosis
with AP diameter of the canal only 5 mm. Cord flattening with
abnormal T2 signal, worse on the left, consistent with compressive
myelopathy. Moderate bilateral foraminal stenosis.

C4-5: Endplate osteophytes and broad-based herniation of disc
material with slight caudal down turning. Spinal stenosis with AP
diameter in the of the canal in the midline only 3.7 mm. Flattening
of spinal cord with mild abnormal T2 signal consistent with
compressive myelopathy. Bilateral foraminal stenosis.

C5-6: Endplate osteophytes and bulging of the disc more prominent
towards the left. Spinal stenosis with AP diameter of the canal in
the midline measuring 5.5 mm. Mild cord deformity. Bilateral
foraminal stenosis.

C6-7: Endplate osteophytes, bulging of the disc and a shallow left
posterolateral disc herniation. AP diameter of the canal in the
midline 8.2 mm. Mild flattening of the left side of the cord.
Foraminal narrowing left more than right.

C7-T1: Minimal disc bulge.  No compressive stenosis.
IMPRESSION: Brain MRI: Acute 7 mm infarction in the left anterior para median
pons. Extensive chronic ischemic changes elsewhere throughout the
brain as outlined above.

Cervical spine MRI: Severe cervical canal stenosis from C3-4 through
C5-6. Cord flattening and deformity with abnormal T2 signal in the
cord at C3-4 and C4-5, consistent with compressive myelopathy.

C6-7 shows a left paracentral disc herniation with mild cord
deformity on the left.

Foraminal stenoses from C3-4 through C6-7 could cause neural
compression as well.

## 2021-09-29 IMAGING — MR MR MRA HEAD W/O CM
2 series · 19 of 48 positions shown · non-contrast
Comparison: Same-day brain MRI, CT a head/neck [DATE]

CLINICAL DATA: Neuro deficit, acute stroke suspected

EXAM:
MRA HEAD WITHOUT CONTRAST
TECHNIQUE: Angiographic images of the Circle of Willis were acquired using MRA
technique without intravenous contrast.

[Series 2: ax (id) · axial · 1.0mm · 0.43mm/px · z∈[-49,+39]mm · 18 of 187 slices shown]
[im 1/187]
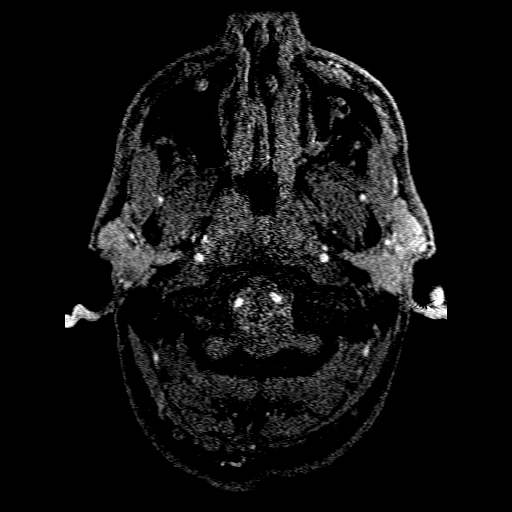
[im 5/187]
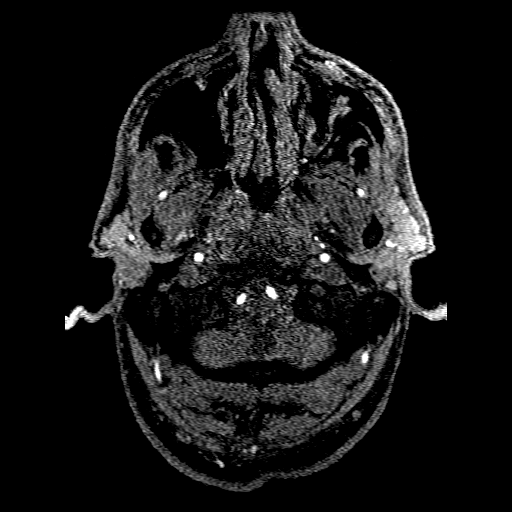
[im 9/187]
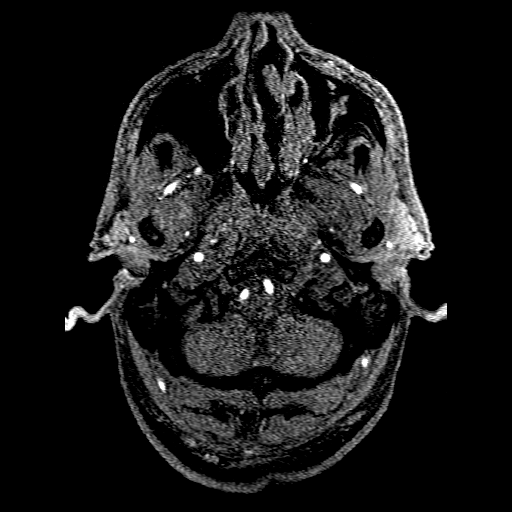
[im 13/187]
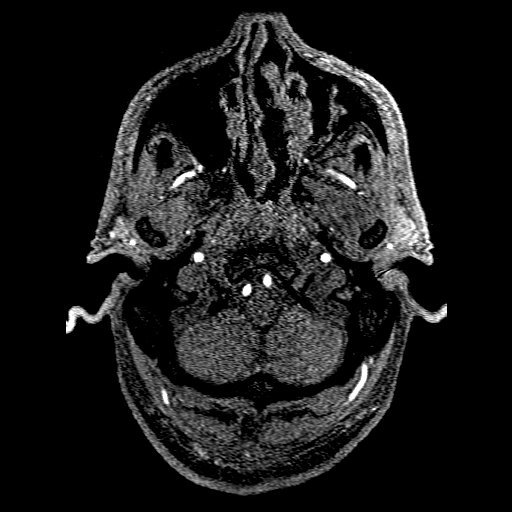
[im 17/187]
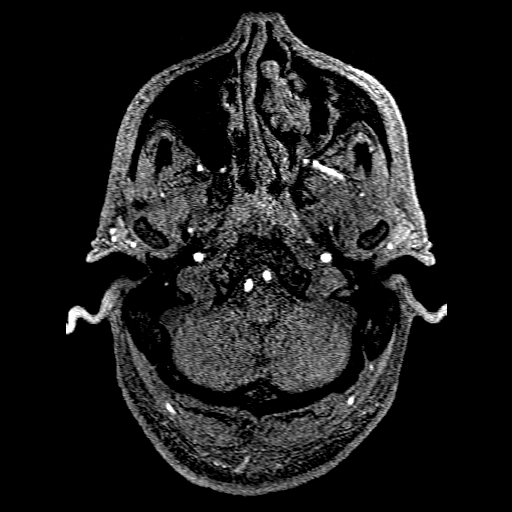
[im 21/187]
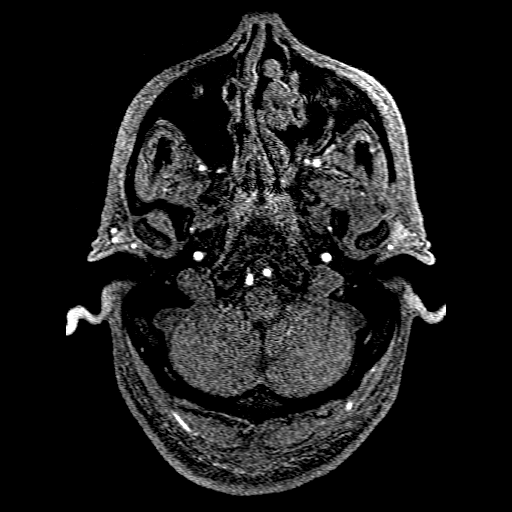
[im 25/187]
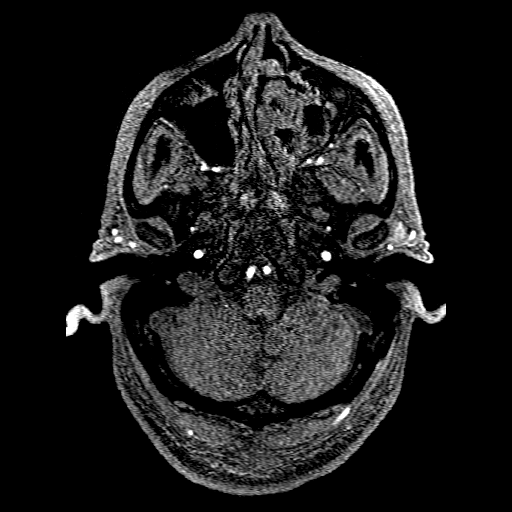
[im 29/187]
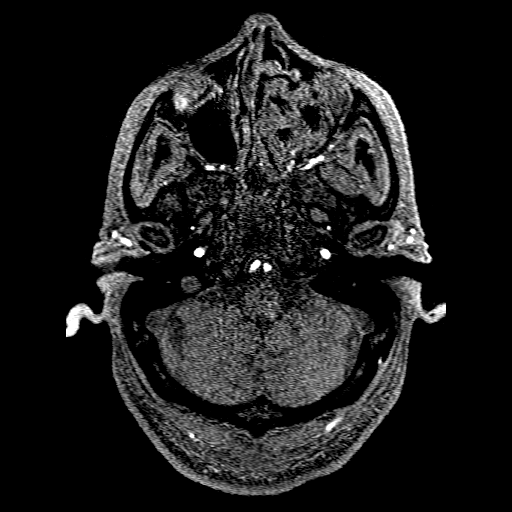
[im 33/187]
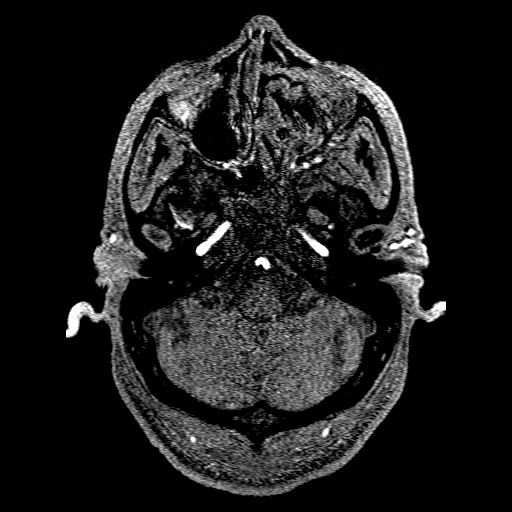
[im 37/187]
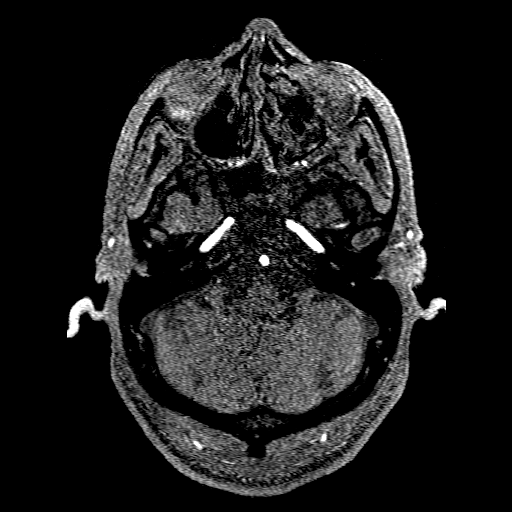
[im 57/187]
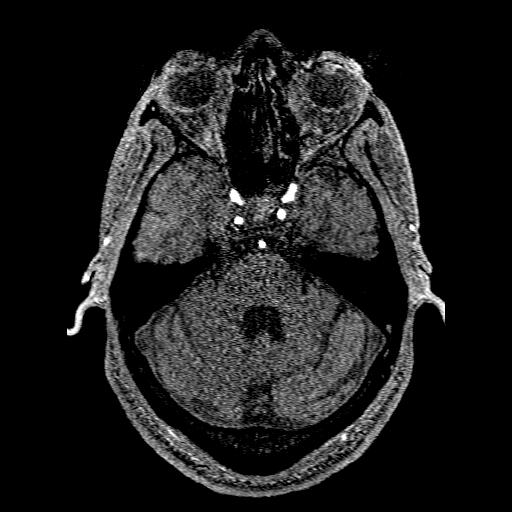
[im 81/187]
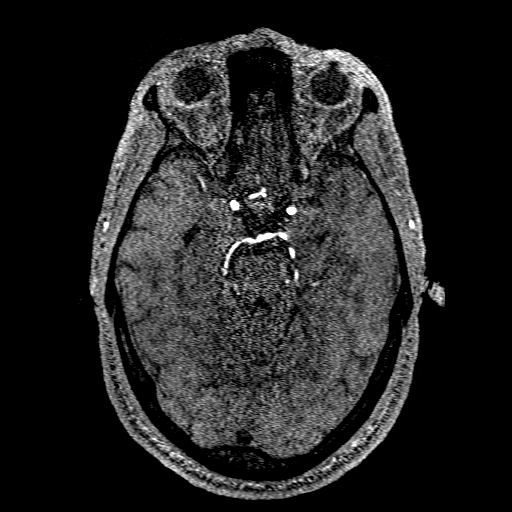
[im 94/187]
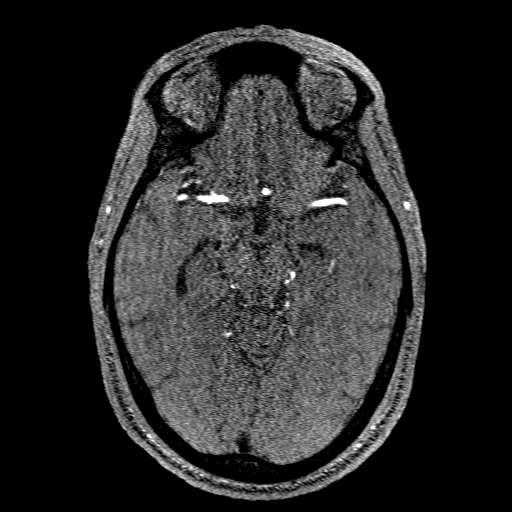
[im 106/187]
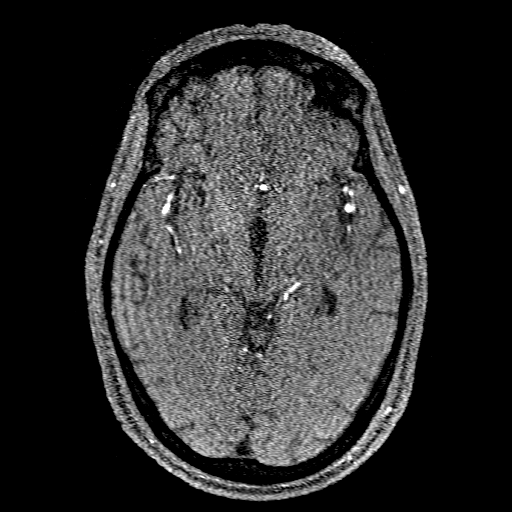
[im 130/187]
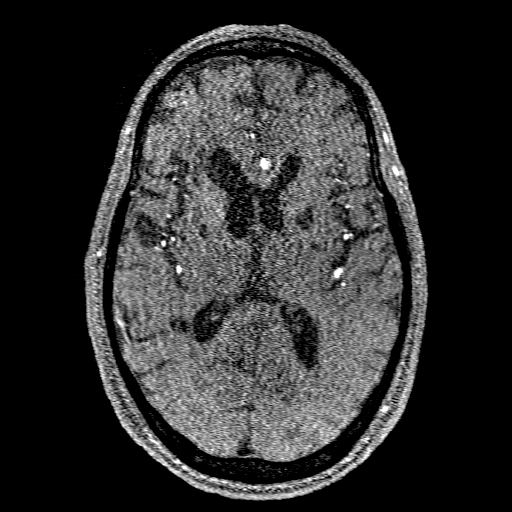
[im 154/187]
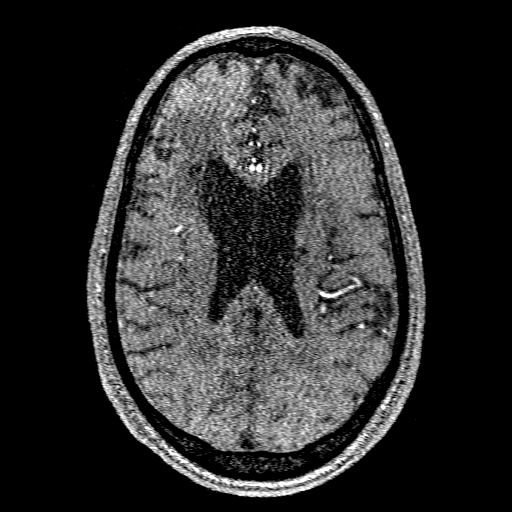
[im 158/187]
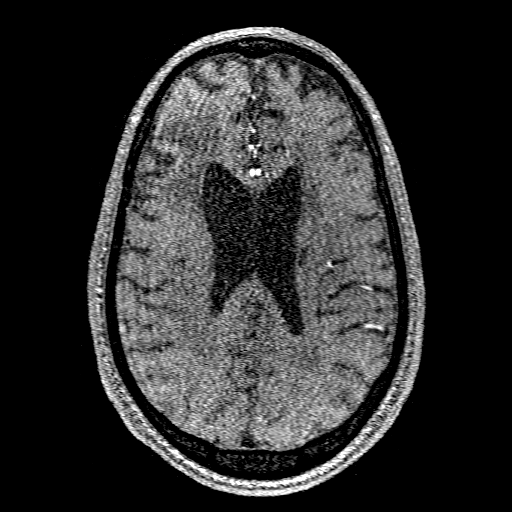
[im 178/187]
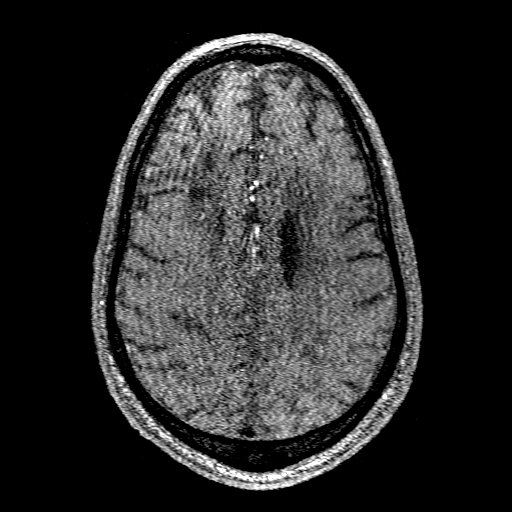

[Series 201: pjn:ax (id) · sagittal · 1.0mm · 0.43mm/px · 1 of 4 slices shown]
[im 1/4]
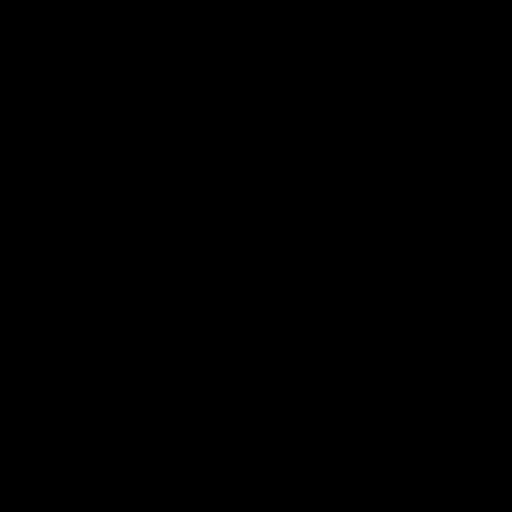

[19 of 48 positions shown; findings below may reference images not displayed]

FINDINGS: Anterior circulation: The intracranial ICAs are patent.

The bilateral MCAs are patent. Previously described moderate
stenosis of the right M1 segment seen on the CTA of [DATE] is
not seen on the current study.

The bilateral ACAs are patent.

There is no aneurysm. Previously described outpouchings arising from
the ophthalmic segments of the ICAs likely reflected the origins of
the ophthalmic arteries.

Posterior circulation: The bilateral V4 segments are patent to the
level imaged. The basilar artery is patent. The bilateral PCAs are
patent with mild irregularity of the distal branches on the left.

Anatomic variants: The bilateral posterior communicating arteries
are not identified. The proximal right ACA is diminutive with the
distal branches supplied by a prominent branch off the left A2
segment.

Other: None.
IMPRESSION: Patent intracranial vasculature with mild atherosclerotic
irregularity of the distal left PCA branches. Previously described
moderate stenosis of the right M1 segment is not appreciated on the
current study.

## 2021-09-29 MED ORDER — SENNOSIDES-DOCUSATE SODIUM 8.6-50 MG PO TABS
1.0000 | ORAL_TABLET | Freq: Two times a day (BID) | ORAL | Status: DC
  Administered 2021-09-29 – 2021-10-05 (×8): 1 via ORAL
  Filled 2021-09-29 (×11): qty 1

## 2021-09-29 MED ORDER — ASPIRIN 325 MG PO TABS
325.0000 mg | ORAL_TABLET | Freq: Once | ORAL | Status: AC
  Administered 2021-09-29: 325 mg via ORAL
  Filled 2021-09-29: qty 1

## 2021-09-29 MED ORDER — CLOPIDOGREL BISULFATE 75 MG PO TABS
75.0000 mg | ORAL_TABLET | Freq: Every day | ORAL | Status: DC
  Administered 2021-09-29 – 2021-10-06 (×8): 75 mg via ORAL
  Filled 2021-09-29 (×8): qty 1

## 2021-09-29 MED ORDER — ATORVASTATIN CALCIUM 40 MG PO TABS
40.0000 mg | ORAL_TABLET | Freq: Every day | ORAL | Status: DC
  Administered 2021-09-29 – 2021-10-06 (×8): 40 mg via ORAL
  Filled 2021-09-29 (×8): qty 1

## 2021-09-29 MED ORDER — INSULIN ASPART 100 UNIT/ML IJ SOLN
0.0000 [IU] | Freq: Three times a day (TID) | INTRAMUSCULAR | Status: DC
  Administered 2021-09-30: 1 [IU] via SUBCUTANEOUS
  Administered 2021-09-30: 9 [IU] via SUBCUTANEOUS
  Administered 2021-10-01: 1 [IU] via SUBCUTANEOUS
  Administered 2021-10-01: 2 [IU] via SUBCUTANEOUS
  Administered 2021-10-02: 3 [IU] via SUBCUTANEOUS
  Administered 2021-10-03: 2 [IU] via SUBCUTANEOUS
  Administered 2021-10-05 – 2021-10-06 (×2): 1 [IU] via SUBCUTANEOUS

## 2021-09-29 MED ORDER — POTASSIUM CHLORIDE CRYS ER 20 MEQ PO TBCR
40.0000 meq | EXTENDED_RELEASE_TABLET | Freq: Once | ORAL | Status: AC
  Administered 2021-09-29: 40 meq via ORAL
  Filled 2021-09-29: qty 2

## 2021-09-29 MED ORDER — ENOXAPARIN SODIUM 40 MG/0.4ML IJ SOSY
40.0000 mg | PREFILLED_SYRINGE | INTRAMUSCULAR | Status: DC
  Administered 2021-09-29 – 2021-10-06 (×8): 40 mg via SUBCUTANEOUS
  Filled 2021-09-29 (×8): qty 0.4

## 2021-09-29 MED ORDER — PANTOPRAZOLE SODIUM 40 MG PO TBEC: 40.0000 mg | DELAYED_RELEASE_TABLET | Freq: Two times a day (BID) | ORAL | Status: DC

## 2021-09-29 MED ORDER — PANTOPRAZOLE SODIUM 40 MG PO TBEC
40.0000 mg | DELAYED_RELEASE_TABLET | Freq: Two times a day (BID) | ORAL | Status: DC
  Administered 2021-09-29 – 2021-10-06 (×14): 40 mg via ORAL
  Filled 2021-09-29 (×15): qty 1

## 2021-09-29 MED ORDER — STROKE: EARLY STAGES OF RECOVERY BOOK
Freq: Once | Status: DC
  Filled 2021-09-29: qty 1

## 2021-09-29 MED ORDER — POLYETHYLENE GLYCOL 3350 17 G PO PACK
17.0000 g | PACK | Freq: Every day | ORAL | Status: DC
  Administered 2021-09-29 – 2021-10-05 (×5): 17 g via ORAL
  Filled 2021-09-29 (×6): qty 1

## 2021-09-29 MED ORDER — ONDANSETRON HCL 4 MG/2ML IJ SOLN
4.0000 mg | Freq: Once | INTRAMUSCULAR | Status: AC
  Administered 2021-09-29: 4 mg via INTRAVENOUS
  Filled 2021-09-29: qty 2

## 2021-09-29 NOTE — ED Notes (Signed)
Pt ambulated with walker without issue 

## 2021-09-29 NOTE — H&P (Addendum)
Date: 09/29/2021               Patient Name:  Calvin Jackson MRN: [id number]  DOB: 09-30-1961 Age / Sex: 60 y.o., male   PCP: Hilbert Corrigan Chales Abrahams, NP         Medical Service: Internal Medicine Teaching Service         Attending Physician: Dr. Earl Lagos, MD    First Contact: Dr. Allena Katz Pager: [phone]  Second Contact: Dr. Elaina Pattee Pager: [phone]       After Hours (After 5p/  First Contact Pager: [phone]  weekends / holidays): Second Contact Pager: [phone]   Chief Complaint: Bilateral lower extremity weakness  History of Present Illness:   [name] is a 60 y/o male with a PMHx of prior CVA, Type 2 Diabetes Mellitus, HTN, HLD who presents to the ED with c/o bilateral lower extremity weakness.   [name] states yesterday afternoon around 2  pm, he was cleaning his shed when he had sudden onset lower extremity weakness that was worse on the right than the left. Due to the weakness, he fell forward and when he tried to catch himself, he noticed his right arm was weak. After laying on the ground for 10-15 minutes, Mr. Wander states he was able to get back up and make it inside his home. Several hours later, he was sitting outside and realized he was not able to stand up on his own. After attempting for a while, he states he "willed himself" to stand up. The rest of his day was uneventful. Today, he was walking outside with his walker and fell due to right lower leg weakness. At this point, he decided to come into the hospital for evaluation. He did not hit his leg during any of these falls. No loss of consciousness or prodromal symptoms.   During the last few days, [name] denies any other complaints, including fever, chills, SOB, chest pain, N/V/D, numbness or tingling, vision changes or rash. He is currently taking Losartan and HCTZ for his blood pressure. He endorses a history of diabetes but is not taking any medications for it at this time. He only takes  Atorvastatin intermittently, as it "makes his head swim."  ED Course:  On arrival to the ED, patient was noted to hypertensive at 180/105 with heart rate of 63.  He was saturating 100% on room air with respiratory rate of 16.  He was afebrile 98.9.  Initial blood work showed a potassium of 3.0 with creatinine of 1.1.  CBC was unremarkable other than mild leukopenia at 3.7. MRI brain was obtained that showed 7 mm acute infarct in the left anterior paramedian pons.  Cervical MRI obtained that showed severe cervical canal stenosis from C3-C4, and C5-C6.  There was evidence of cord flattening with deformity at C3-4 and C4-5.  Neurology and neurosurgery were consulted.  IM[facility] is consulted for admission.  Meds:  No current facility-administered medications on file prior to encounter.   Current Outpatient Medications on File Prior to Encounter  Medication Sig Dispense Refill   acetaminophen (TYLENOL) 500 MG tablet Take 1,000 mg by mouth 3 (three) times daily as needed for mild pain.     amLODipine (NORVASC) 10 MG tablet Take 10 mg by mouth daily.     aspirin 81 MG chewable tablet Chew 1 tablet (81 mg total) by mouth daily.     atorvastatin (LIPITOR) 40 MG tablet Take 1 tablet (40 mg total) by mouth  daily. 30 tablet 3   clopidogrel (PLAVIX) 75 MG tablet Take 75 mg by mouth daily.     cyclobenzaprine (FLEXERIL) 10 MG tablet Take 10 mg by mouth at bedtime as needed for muscle spasms.      famotidine (PEPCID) 40 MG tablet Take 1 tablet (40 mg total) by mouth every evening. 90 tablet 1   FLOMAX 0.4 MG CAPS capsule Take 0.4 mg by mouth at bedtime.     hydrochlorothiazide (HYDRODIURIL) 25 MG tablet Take 25 mg by mouth daily.     losartan (COZAAR) 100 MG tablet Take 100 mg by mouth daily.     metFORMIN (GLUCOPHAGE) 500 MG tablet Take 500 mg by mouth in the morning and at bedtime.     pantoprazole (PROTONIX) 40 MG tablet Take 1 tablet (40 mg total) by mouth daily. (Patient not taking: No sig reported) 30 tablet  0   Allergies: Allergies as of 09/28/2021   (No Known Allergies)   Past Medical History:  Diagnosis Date   Diabetes mellitus with neurological manifestation (HCC)    Dyslipidemia    Essential hypertension    Marijuana abuse 10/15/2020   Stroke [facility])    Tobacco dependence 10/15/2020   Family History:  No family history  Social History:  Patient states he smokes 6 to 7 cigarettes/day for the last 20 years He denies any alcohol use He endorses previous marijuana use but has not done so in at least a year.  He lives with his sister as he is unable to live by himself due to deficits/stroke.  There are times that he feels unsafe around his sister but states he has nowhere else to live and is not interested in going to an assisted living facility.  Review of Systems: A complete ROS was negative except as per HPI.   Physical Exam: Blood pressure (!) 160/81, pulse (!) 52, temperature 99.1 F (37.3 C), temperature source Oral, resp. rate 13, SpO2 97 %.  Physical Exam Vitals and nursing note reviewed.  Constitutional:      General: He is not in acute distress.    Appearance: He is normal weight. He is not toxic-appearing.  HENT:     Head: Normocephalic and atraumatic.     Mouth/Throat:     Mouth: Mucous membranes are moist.     Pharynx: Oropharynx is clear.     Comments: Patient has only 1 tooth on the lower jaw Eyes:     Extraocular Movements: Extraocular movements intact.     Conjunctiva/sclera: Conjunctivae normal.     Pupils: Pupils are equal, round, and reactive to light.  Cardiovascular:     Rate and Rhythm: Normal rate and regular rhythm.     Heart sounds: No murmur heard.   No gallop.  Pulmonary:     Effort: Pulmonary effort is normal. No respiratory distress.     Breath sounds: Normal breath sounds. No wheezing or rales.  Abdominal:     General: Bowel sounds are normal. There is no distension.     Palpations: Abdomen is soft.     Tenderness: There is no  abdominal tenderness. There is no guarding.  Musculoskeletal:        General: Normal range of motion.     Cervical back: Neck supple.     Right lower leg: No edema.     Left lower leg: No edema.  Skin:    General: Skin is warm and dry.     Findings: Rash (6 x 6 cm hyperpigmented  scaly rash on the anterior right ankle, chronic) present.  Neurological:     Mental Status: He is alert.     Comments:  Mental Status: Patient is awake, alert, oriented to person, place, month, year, and situation. Patient is able to give a clear and coherent history. No signs of aphasia or neglect  Cranial Nerves: II: Pupils are equal, round, and reactive to light.   III,IV, VI: EOMI without ptosis or diploplia.  V: Facial sensation is symmetric to temperature * VII: Facial asymmetry noted with weakness on the left with smiling and puffing of cheeks * VIII: hearing is intact to voice X: Uvula elevates symmetrically XI: Shoulder shrug is symmetric. XII: tongue is midline without atrophy or fasciculations.   Motor:  Right upper extremity strength, 4 out of 5.  Left upper extremity strength 5 out of 5. Right lower extremity strength 4 out of 5.  Left lower extremity strength 5 out of 5  Sensory:  Sensation grossly intact  Cerebellar:  Finger-to-nose intact bilaterally   EKG: personally reviewed my interpretation is: Sinus rhythm with no ST or T wave changes consistent with acute ischemia   Assessment & Plan by Problem: Active Problems:   Acute CVA (cerebrovascular accident) [facility])   Mr. Halim Surrette is a 60 y/o male with a PMHx of prior CVA, Type 2 Diabetes Mellitus, HTN, HLD who presents to the ED with c/o bilateral lower extremity weakness and is currently admitted for acute CVA.  # Acute CVA Patient presenting with 1 day history of predominantly right lower extremity In addition to some upper extremity weakness.  On examination, he does have 4 out of 5 strength in his upper and lower  extremities with evidence of left facial droop.  MRI brain with 7 mm acute infarct involving the left anterior paramedian pons, which correlates with patient's symptoms.   - Neurology has been consulted; appreciate their recommendations - MRA head and neck  - TTE - A1c and lipid panel for risk management - PT/OT/SLP - One-time dose of aspirin 325 mg today - Start aspirin 81 mg tomorrow - Start Plavix 75 mg today - Restart home atorvastatin 40 mg daily - Permissive hypertension up to 220/110  # Cervical Spine Stenosis w/ Myelopathy  Patient has had numerous falls over the last 24 hours, although he denies any spinal or head trauma.  Cervical MRI remarkable for severe spinal stenosis and cord compression involving C3-4 and C4-5.  Neurosurgery has been consulted, however surgical plans are deferred pending CVA evaluation.  - Neurosurgery consulted; appreciate their recommendations  # Hypertension  Home medications include amlodipine, losartan and HCTZ, however patient states he is only taking losartan HCTZ.  We will hold at this time due to acute CVA and restart after 48 hours.  - Holding home antihypertensives  # Type 2 Diabetes Mellitus  Last A1c in the chart is 5.9%, however patient endorses a history of type 2 diabetes.  Per chart review, he is supposed to be on metformin but is not currently taking this at this time.  - A1c pending - SSI - Restart metformin on discharge  # Esophagitis  Recent ED visit with CT abdomen/pelvis notable for distal esophagitis.  He was started on PPI but did not pick up the prescription.  Etiology uncertain at this time, but will need outpatient work-up with potential EGD given patient is high risk for malignancy in the setting of long term tobacco use.  - Protonix 40 mg twice daily - Outpatient referral  to GI  # Constipation  Recent ED visit with CT abdomen/pelvis notable for fecal action with rectosigmoid colon dilation up to 14 cm.  Patient  denying any abdominal pain at this time.  - Start daily MiraLAX and senna  Diet: Heart Healthy VTE: Enoxaparin IVF: None,None Code: Full  Prior to Admission Living Arrangement: Home, living with his sister Anticipated Discharge Location:  TBD Barriers to Discharge: Medical stabilization  Dispo: Admit patient to Observation with expected length of stay less than 2 midnights.  Signed: Dr. Verdene Lennert Internal Medicine PGY-3 Pager: [phone] After 5pm on weekdays and 1pm on weekends: On Call pager [phone]  09/29/2021, 3:28 PM

## 2021-09-29 NOTE — Progress Notes (Addendum)
Subjective: No acute overnight events. Patient was seen at bedside during rounds today.   Pt reports feeling well, and that his strength has improved since coming to the hospital. Pt denies chest pain, fevers, SHOB. No complaints or concerns at this time.   Pt is updated on the plan for today, and all questions are addressed.   Objective:  Vital signs in last 24 hours: Vitals:   09/29/21 1930 09/29/21 1945 09/29/21 2000 09/29/21 2015  BP: (!) 166/96 (!) 177/108 (!) 171/96 (!) 182/93  Pulse: (!) 53 64 (!) 59 60  Resp: 14 13 14 16   Temp:      TempSrc:      SpO2: 92% 96% 96% 96%   Constitutional: alert, well-appearing, in NAD HENT: normocephalic, atraumatic, mucous membranes moist Cardiovascular: RRR, no m/r/g, non-edematous bilateral LE Pulmonary/Chest: normal work of breathing on RA, LCTAB Abdominal: soft, non-tender to palpation, non-distended MSK: normal bulk and tone Skin: warm and dry Psych: normal behavior, normal affect Neurological: A&O x 3, symmetric sensation, facial asymmetry noted with weakness on the left with smiling and puffing of cheeks, tongue is midline without atrophy or fasciculations, upper and lower left extremity strength 5/5, and upper and lower left extremity strength 4/5  Assessment/Plan:  Active Problems:   Acute CVA (cerebrovascular accident) [facility])  [name] is a 60 y/o male with a PMHx of prior CVA, Type 2 Diabetes Mellitus, HTN, HLD admitted for left anterior paramedian pons CVA.  # Acute ischemia of the left anterior paramedian pons MRI brain with 7 mm acute infarct involving the left anterior paramedian pons. MRA head and neck unremarkable. TEE (LVEF 50-60) without embolic suspicion. A1c 5.6 and lipid panel with LDL 70. Counseled on risk factor modifications, including smoking cessation and medication adherence. Improving UE and LE weakness with mild left facial droop. Neurology following.  - F/u on stroke team recommendations - Start  with aspirin 81 mg  - Start Plavix 75 mg qd - Atorvastatin 40 mg qd - Restart home BP meds: losartan 100 and amlodipine 10  -- Frequent neuro checks  - PT/OT eval and treat    # Cervical Spine Stenosis w/ Myelopathy  Cervical MRI with severe spinal stenosis and cord compression involving C3-4 and C4-5.  Neurosurgery consulted, surgical plans deferred pending CVA evaluation/stabilization.  - Follow up with neurosurgery consultation   # COVID 19 Infection  Tested positive on 10/26. Pt is asymptomatic without SHOB, fever, chills, increased work of breathing, or N/V/D. He is satting well on RA. Inflammatory markers WNL. Will CTM.    # Hypertension  SBP 170's this AM.  - Restart BP medications: losartan 100 and amlodipine 10, given > 48 hours since onset of CVA onset.    # T2DM A1c 5.9 in 10/2020. Repeat A1c 5.6 this admission. Does not take any DM medications  - SSI   # Esophagitis  CT abdomen/pelvis with for distal esophagitis. Etiology unknown, will need OP work-up with potential EGD given pt is high risk for malignancy in the setting of long term tobacco use. - Protonix 40 mg bid - Outpatient referral to GI   # Constipation  CT abdomen/pelvis with fecal action with rectosigmoid colon dilation up to 14 cm. Denies abdominal pain. - Start daily MiraLAX and senna   Best Practices: Diet: Heart Healthy VTE: Enoxaparin IVF: None,None Code: Full   11/2020, MD  Internal Medicine Resident, PGY-1 [facility]  Pager: [phone] After 5pm on weekdays and 1pm on  weekends: On Call pager [phone]

## 2021-09-29 NOTE — ED Provider Notes (Signed)
Patient signed out to me is pending advanced imaging.  MRI of the brain concerning for acute stroke, MRI of the C-spine concerning for C3-4-5 6 compressive myelopathy.  Consultation with neurology, neurosurgery, admission to medicine.   Cheryll Cockayne, MD 09/29/21 1124

## 2021-09-29 NOTE — ED Notes (Signed)
Pt was assisted to a standing position to ambulate per the doctor. Pt ambulated 44ft total with a walker without assistance. Pt stated he does not feel lightheaded or dizziness. Pt was assisted back into bed locked low safe position call light within reach.

## 2021-09-29 NOTE — Consult Note (Signed)
Neurology Consultation  Reason for Consult: Mri brain with acute 7 mm infarction in the left anterior para median pons Referring Physician: Dr. Audley Hose  CC: Recent falls with unsteady gait  History is obtained from: Chart review, patient  HPI: Calvin Jackson is a 60 y.o. male with a medial history significant for type 2 diabetes mellitus, hyperlipidemia, essential hypertension, tobacco dependence, stroke, and remote marijuana use who presented to the ED 10/25 for evaluation of imbalance with walking and multiple recent falls. Patient states that he woke up 2 days ago and felt that his ambulation was off and he was leaning towards the right. He states that when he stands, he feels steady, but when he tries to walk he feels like his right leg is heavy and that his "right foot slaps the ground" in addition to feeling imbalanced. He states that the symptoms are worse in the mornings when [place] and do sometimes wax and wane in quality. He states that today, he fell three times without injury. Patient does sometimes give variable reporting with leftward leaning gait at times and L/R confusion with complaints of weakness.   LKW: 10/23 when he went to bed TNK given?: no, patient is well outside of the thrombolytic therapy window on hospital arrival IR Thrombectomy? No, presentation is not consistent with an LVO Modified Rankin Scale: 3-Moderate disability-requires help but walks WITHOUT assistance  ROS: A complete ROS was performed and is negative except as noted in the HPI.   Past Medical History:  Diagnosis Date   Diabetes mellitus with neurological manifestation (HCC)    Dyslipidemia    Essential hypertension    Marijuana abuse 10/15/2020   Stroke [facility])    Tobacco dependence 10/15/2020  History reviewed. No pertinent surgical history.  No family history on file.  Social History:   reports that he has been smoking. He has a 23.50 pack-year smoking history. He has  never used smokeless tobacco. He reports that he does not currently use alcohol. He reports current drug use. Drug: Marijuana.  Medications  Current Facility-Administered Medications:     stroke: mapping our early stages of recovery book, , Does not apply, Once, Verdene Lennert, MD   aspirin tablet 325 mg, 325 mg, Oral, Once, Verdene Lennert, MD   atorvastatin (LIPITOR) tablet 40 mg, 40 mg, Oral, Daily, Verdene Lennert, MD   clopidogrel (PLAVIX) tablet 75 mg, 75 mg, Oral, Daily, Verdene Lennert, MD   enoxaparin (LOVENOX) injection 40 mg, 40 mg, Subcutaneous, Q24H, Huel Cote, Iulia, MD   insulin aspart (novoLOG) injection 0-9 Units, 0-9 Units, Subcutaneous, TID WC, Verdene Lennert, MD   pantoprazole (PROTONIX) EC tablet 40 mg, 40 mg, Oral, BID, Verdene Lennert, MD   sodium chloride flush (NS) 0.9 % injection 3 mL, 3 mL, Intravenous, Once, Tilden Fossa, MD  Current Outpatient Medications:    acetaminophen (TYLENOL) 500 MG tablet, Take 1,000 mg by mouth 3 (three) times daily as needed for mild pain., Disp: , Rfl:    amLODipine (NORVASC) 10 MG tablet, Take 10 mg by mouth daily., Disp: , Rfl:    aspirin 81 MG chewable tablet, Chew 1 tablet (81 mg total) by mouth daily., Disp: , Rfl:    atorvastatin (LIPITOR) 40 MG tablet, Take 1 tablet (40 mg total) by mouth daily., Disp: 30 tablet, Rfl: 3   clopidogrel (PLAVIX) 75 MG tablet, Take 75 mg by mouth daily., Disp: , Rfl:    cyclobenzaprine (FLEXERIL) 10 MG tablet, Take 10 mg by mouth  at bedtime as needed for muscle spasms. , Disp: , Rfl:    famotidine (PEPCID) 40 MG tablet, Take 1 tablet (40 mg total) by mouth every evening., Disp: 90 tablet, Rfl: 1   FLOMAX 0.4 MG CAPS capsule, Take 0.4 mg by mouth at bedtime., Disp: , Rfl:    hydrochlorothiazide (HYDRODIURIL) 25 MG tablet, Take 25 mg by mouth daily., Disp: , Rfl:    losartan (COZAAR) 100 MG tablet, Take 100 mg by mouth daily., Disp: , Rfl:    metFORMIN (GLUCOPHAGE) 500 MG tablet, Take 500 mg by  mouth in the morning and at bedtime., Disp: , Rfl:    pantoprazole (PROTONIX) 40 MG tablet, Take 1 tablet (40 mg total) by mouth daily. (Patient not taking: No sig reported), Disp: 30 tablet, Rfl: 0  Exam: Current vital signs: BP (!) 160/81   Pulse (!) 52   Temp 99.1 F (37.3 C) (Oral)   Resp 13   SpO2 97%  Vital signs in last 24 hours: Temp:  [98.9 F (37.2 C)-99.4 F (37.4 C)] 99.1 F (37.3 C) (10/26 0905) Pulse Rate:  [52-74] 52 (10/26 1215) Resp:  [12-20] 13 (10/26 1215) BP: (147-188)/(81-112) 160/81 (10/26 1215) SpO2:  [96 %-100 %] 97 % (10/26 1215)  GENERAL: Awake, alert, in no acute distress Psych: Affect appropriate for situation, patient is calm and cooperative with examination Head: Normocephalic and atraumatic, without obvious abnormality EENT: Normal conjunctivae, dry mucous membranes, no OP obstruction, poor dentition and mostly edentulous LUNGS: Normal respiratory effort. Non-labored breathing on room air, SpO2 97% on telemetry CV: Bradycardia on telemetry ABDOMEN: Soft, non-tender, non-distended Extremities: warm, well perfused, without obvious deformity  NEURO:  Mental Status: Awake, alert, and oriented to person, place, time, and situation. He is able to provide a clear and coherent history of present illness with some inconsistent details between providers Speech/Language: speech is dysarthric 2/2 edentulous state, patient states his speech is at baseline.   Naming, repetition, fluency, and comprehension intact without aphasia. No neglect is noted Cranial Nerves:  II: PERRL 3 mm/brisk. Visual fields full.  III, IV, VI: EOMI without ptosis, gaze preference, or nystagmus V: Sensation is intact to light touch and symmetrical to face.  VII: Face with mild weakness on the right.  VIII: Hearing is intact to voice IX, X: Palate elevation is symmetric. Phonation normal.  XI: Normal sternocleidomastoid and trapezius muscle strength XII: Tongue protrudes midline  without fasciculations.   Motor: 5/5 strength present in the left upper and lower extremities.  There is subtle pronator drift of right upper extremity and no drift on the LUE. RLE 4+/5 strength with subtle weakness with less vertical elevation than the left but BLE are without vertical drift, btu to confrontation, mild right leg weakness.  Tone is normal. Bulk is normal.  Sensation: Intact to light touch bilaterally in all four extremities. No extinction to DSS present.  Coordination: FTN and HKS intact bilaterally with slowed movements throughout. Decreased rapid alternating movements on the right hand.  DTRs: 2+ and symmetric patellae, biceps, and brachioradialis.  Gait: Deferred  NIHSS: 1a Level of Conscious.: 0 1b LOC Questions: 0 1c LOC Commands: 0 2 Best Gaze: 0 3 Visual: 0 4 Facial Palsy: 1 5a Motor Arm - left: 0 5b Motor Arm - Right: 1 6a Motor Leg - Left: 0 6b Motor Leg - Right: 0 7 Limb Ataxia: 0 8 Sensory: 0 9 Best Language: 0 10 Dysarthria: 1 11 Extinct. and Inatten.: 0 TOTAL: 3  Labs I have  reviewed labs in epic and the results pertinent to this consultation are: CBC    Component Value Date/Time   WBC 3.7 (L) 09/28/2021 1805   RBC 5.17 09/28/2021 1805   HGB 15.0 09/28/2021 1826   HCT 44.0 09/28/2021 1826   PLT 201 09/28/2021 1805   MCV 77.4 (L) 09/28/2021 1805   MCH 26.7 09/28/2021 1805   MCHC 34.5 09/28/2021 1805   RDW 13.8 09/28/2021 1805   LYMPHSABS 0.8 09/28/2021 1805   MONOABS 0.5 09/28/2021 1805   EOSABS 0.2 09/28/2021 1805   BASOSABS 0.0 09/28/2021 1805   CMP     Component Value Date/Time   NA 140 09/28/2021 1826   K 3.0 (L) 09/28/2021 1826   CL 100 09/28/2021 1826   CO2 24 09/28/2021 1805   GLUCOSE 126 (H) 09/28/2021 1826   BUN 10 09/28/2021 1826   CREATININE 1.20 09/28/2021 1826   CALCIUM 8.5 (L) 09/28/2021 1805   PROT 6.7 09/28/2021 1805   ALBUMIN 3.6 09/28/2021 1805   AST 21 09/28/2021 1805   ALT 16 09/28/2021 1805   ALKPHOS 50  09/28/2021 1805   BILITOT 0.9 09/28/2021 1805   GFRNONAA >60 09/28/2021 1805   Lipid Panel     Component Value Date/Time   CHOL 133 10/16/2020 0301   TRIG 62 10/16/2020 0301   HDL 46 10/16/2020 0301   CHOLHDL 2.9 10/16/2020 0301   VLDL 12 10/16/2020 0301   LDLCALC 75 10/16/2020 0301   Lab Results  Component Value Date   HGBA1C 5.9 (H) 10/15/2020   Imaging I have reviewed the images obtained:  CT-scan of the brain 09/28/2021: 1. Generalized cerebral atrophy. 2. Multiple bilateral basal ganglia lacunar infarcts. 3. Additional small chronic infarct along the right frontal lobe and anterolateral aspect of the right cerebellum. 4. Chronic left maxillary sinus disease.  MRI examination of the brain 10/26: Acute 7 mm infarction in the left anterior para median pons. Extensive chronic ischemic changes elsewhere throughout the brain as outlined above.  Cervical spine MRI: Severe cervical canal stenosis from C3-4 through C5-6. Cord flattening and deformity with abnormal T2 signal in the cord at C3-4 and C4-5, consistent with compressive myelopathy. C6-7 shows a left paracentral disc herniation with mild cord deformity on the left. Foraminal stenoses from C3-4 through C6-7 could cause neural compression as well.   Pt seen by NP/Neuro and later by MD. Note/plan to be edited by MD as needed.  Lanae Boast, AGAC-NP [facility] Pager: [phone]  I have seen the patient and reviewed the above note.  He reports a gradual progressive worsening of his gait over the past few weeks to months, but then an abrupt change  causing him to fall.  Assessment: 60 y.o. male stroke risk factors include history of stroke, tobacco use, remote CVAs, HTN, and HLD. who presented to the ED 10/25 for evaluation of 2 day onset of falls with right leg heaviness and imbalanced gait superimposed on a more chronic progressive gait dysfunction.I suspect that the cervical myelopathy is related to  more chronic problems and his acute change today was his ischemic stroke.   Etiology is likely small vessel disease related to the above risk factors.  He is on chronic Plavix, and I would continue this but add aspirin for the next few weeks.  Recommendations: - HgbA1c, fasting lipid panel with statin for goal LDL < 70 - MRA head and neck without contrast - Frequent neuro checks - Echocardiogram pending - Prophylactic therapy- Antiplatelet med: Aspirin -  dose 325mg  PO or 300mg  PR once followed by ASA 81 mg PO daily in addition to clopidogrel 75 mg PO daily - Risk factor modification; discussed the importance of smoking cessation  - Telemetry monitoring; consider outpatient event monitor if no arrhythmias captured inpatient - PT consult, OT consult - Stroke team to follow - Myelopathy management per neurosurgery team  , MD [facility] [phone]  If 7pm- 7am, please page neurology on call as listed in AMION.

## 2021-09-29 NOTE — ED Notes (Signed)
Patient transported to MRI 

## 2021-09-29 NOTE — ED Notes (Signed)
Pt c/o nausea. Provided emesis bag. Paged admitting.

## 2021-09-29 NOTE — ED Provider Notes (Signed)
[facility] EMERGENCY DEPARTMENT Provider Note   CSN: [id number] Arrival date & time: 09/28/21  1625     History Chief Complaint  Patient presents with   Gait Problem    Calvin Jackson is a 60 y.o. male.  The history is provided by the patient and medical records.  Calvin Jackson is a 60 y.o. male who presents to the Emergency Department complaining of can't walk.  He presents to the ED for evaluation of two days of BLE, right greater than left.  Sxs wax and wane but worsened today.  He fell three times today.  No injuries from any of the falls.  He does have some pain in the right hip.    No fever, chest pain, sob.  Has nausea if he eats something spicy.  Had a similar episode two months ago and was admitted at [facility].  Last stroke was two years ago.    Lives with his sister.  Ambulates with a walker.    Smokes tobacco.  No alcohol.  No street drugs.      Past Medical History:  Diagnosis Date   Diabetes mellitus with neurological manifestation (HCC)    Dyslipidemia    Essential hypertension    Marijuana abuse 10/15/2020   Stroke (HCC)    Tobacco dependence 10/15/2020    Patient Active Problem List   Diagnosis Date Noted   Acute CVA (cerebrovascular accident) (HCC) 10/15/2020   Marijuana abuse 10/15/2020   Tobacco dependence 10/15/2020   Essential hypertension    Dyslipidemia    Diabetes mellitus with neurological manifestation (HCC)     History reviewed. No pertinent surgical history.     No family history on file.  Social History   Tobacco Use   Smoking status: Every Day    Packs/day: 0.50    Years: 47.00    Pack years: 23.50    Types: Cigarettes   Smokeless tobacco: Never   Tobacco comments:    declines patch - reports vivid dreams  Substance Use Topics   Alcohol use: Not Currently   Drug use: Yes    Types: Marijuana    Comment: occasional use    Home Medications Prior to Admission medications   Medication Sig Start Date  End Date Taking? Authorizing Provider  aspirin 81 MG chewable tablet Chew 1 tablet (81 mg total) by mouth daily. 10/18/20   Lewie Chamber, MD  atorvastatin (LIPITOR) 40 MG tablet Take 1 tablet (40 mg total) by mouth daily. 10/18/20   Lewie Chamber, MD  cyclobenzaprine (FLEXERIL) 10 MG tablet Take 10 mg by mouth at bedtime as needed for muscle spasms.  08/31/20   [provider]  famotidine (PEPCID) 40 MG tablet Take 1 tablet (40 mg total) by mouth every evening. 10/18/20 01/16/21  Lewie Chamber, MD  FLOMAX 0.4 MG CAPS capsule Take 0.4 mg by mouth at bedtime. 08/31/20   [provider]  hydrochlorothiazide (HYDRODIURIL) 25 MG tablet Take 25 mg by mouth daily. 08/31/20   [provider]  losartan (COZAAR) 100 MG tablet Take 100 mg by mouth daily. 08/31/20   [provider]  metFORMIN (GLUCOPHAGE) 500 MG tablet Take 500 mg by mouth daily. 08/31/20   [provider]  MIRALAX 17 GM/SCOOP powder Take 17 g by mouth daily as needed for mild constipation.  05/27/20   [provider]  pantoprazole (PROTONIX) 40 MG tablet Take 1 tablet (40 mg total) by mouth daily. 09/24/21   Eber Hong, MD  Allergies    Patient has no known allergies.  Review of Systems   Review of Systems  All other systems reviewed and are negative.  Physical Exam Updated Vital Signs BP (!) 181/112   Pulse 74   Temp 99.4 F (37.4 C)   Resp 20   SpO2 98%   Physical Exam Vitals and nursing note reviewed.  Constitutional:      Appearance: He is well-developed.  HENT:     Head: Normocephalic and atraumatic.  Cardiovascular:     Rate and Rhythm: Normal rate and regular rhythm.     Heart sounds: No murmur heard. Pulmonary:     Effort: Pulmonary effort is normal. No respiratory distress.     Breath sounds: Normal breath sounds.  Abdominal:     Palpations: Abdomen is soft.     Tenderness: There is no abdominal tenderness. There is no guarding or rebound.   Musculoskeletal:        General: No swelling or tenderness.  Skin:    General: Skin is warm and dry.  Neurological:     Mental Status: He is alert and oriented to person, place, and time.     Comments: No asymmetry of facial movement.  Visual fields grossly intact.  Slow to initiate movement.  4/5 strength in BUE.  4+/5 in LLE.  4/5 RLE.    Psychiatric:        Behavior: Behavior normal.    ED Results / Procedures / Treatments   Labs (all labs ordered are listed, but only abnormal results are displayed) Labs Reviewed  CBC - Abnormal; Notable for the following components:      Result Value   WBC 3.7 (*)    MCV 77.4 (*)    All other components within normal limits  COMPREHENSIVE METABOLIC PANEL - Abnormal; Notable for the following components:   Potassium 3.0 (*)    Glucose, Bld 127 (*)    Creatinine, Ser 1.32 (*)    Calcium 8.5 (*)    All other components within normal limits  I-STAT CHEM 8, ED - Abnormal; Notable for the following components:   Potassium 3.0 (*)    Glucose, Bld 126 (*)    Calcium, Ion 1.12 (*)    All other components within normal limits  PROTIME-INR  APTT  DIFFERENTIAL  CBG MONITORING, ED    EKG None  Radiology CT HEAD WO CONTRAST  Result Date: 09/28/2021 CLINICAL DATA:  Neurological deficit. EXAM: CT HEAD WITHOUT CONTRAST TECHNIQUE: Contiguous axial images were obtained from the base of the skull through the vertex without intravenous contrast. COMPARISON:  October 15, 2020 FINDINGS: Brain: There is mild cerebral atrophy with widening of the extra-axial spaces and ventricular dilatation. There are areas of decreased attenuation within the white matter tracts of the supratentorial brain, consistent with microvascular disease changes. Multiple bilateral basal ganglia lacunar infarcts are seen. An additional small chronic infarct is seen along the right frontal lobe and anterolateral aspect of the cerebellum on the right. Vascular: No hyperdense vessel or  unexpected calcification. Skull: Normal. Negative for fracture or focal lesion. Sinuses/Orbits: Chronic thickening of the wall of the left maxillary sinus is seen. Other: None. IMPRESSION: 1. Generalized cerebral atrophy. 2. Multiple bilateral basal ganglia lacunar infarcts. 3. Additional small chronic infarct along the right frontal lobe and anterolateral aspect of the right cerebellum. 4. Chronic left maxillary sinus disease. Electronically Signed   By: Aram Candela M.D.   On: 09/28/2021 19:05    Procedures Procedures  Medications Ordered in ED Medications  sodium chloride flush (NS) 0.9 % injection 3 mL (has no administration in time range)    ED Course  I have reviewed the triage vital signs and the nursing notes.  Pertinent labs & imaging results that were available during my care of the patient were reviewed by me and considered in my medical decision making (see chart for details).    MDM Rules/Calculators/A&P                          patient here for evaluation of gait difficulties over the last two days. He does have global weakness but is weakest in the right lower extremity. He is able to ambulate with a walker in the emergency department. Given his progressive weakness will check an MRI to rule out subacute infarct. Patient care transferred pending MRI.  Final Clinical Impression(s) / ED Diagnoses Final diagnoses:  None    Rx / DC Orders ED Discharge Orders     None        Tilden Fossa, MD 09/29/21 [phone]

## 2021-09-29 NOTE — Consult Note (Signed)
Reason for Consult:cervical stenosis Referring Physician: ED  Calvin Jackson is an 60 y.o. male.  HPI: brought in after falling in his yard, and falling earlier. He reports great difficulty walking. Describes weakness in both lower extremities.Uses a walker at baseline.   Past Medical History:  Diagnosis Date   Diabetes mellitus with neurological manifestation (HCC)    Dyslipidemia    Essential hypertension    Marijuana abuse 10/15/2020   Stroke [facility])    Tobacco dependence 10/15/2020    History reviewed. No pertinent surgical history.  No family history on file.  Social History:  reports that he has been smoking. He has a 23.50 pack-year smoking history. He has never used smokeless tobacco. He reports that he does not currently use alcohol. He reports current drug use. Drug: Marijuana.  Allergies: No Known Allergies  Medications: I have reviewed the patient's current medications.  Results for orders placed or performed during the hospital encounter of 09/28/21 (from the past 48 hour(s))  Protime-INR     Status: None   Collection Time: 09/28/21  6:05 PM  Result Value Ref Range   Prothrombin Time 14.9 11.4 - 15.2 seconds   INR 1.2 0.8 - 1.2    Comment: (NOTE) INR goal varies based on device and disease states. Performed at [facility], [address] [place], [place] [address]   APTT     Status: None   Collection Time: 09/28/21  6:05 PM  Result Value Ref Range   aPTT 30 24 - 36 seconds    Comment: Performed at [facility], [address], [place], [place] [address]  CBC     Status: Abnormal   Collection Time: 09/28/21  6:05 PM  Result Value Ref Range   WBC 3.7 (L) 4.0 - 10.5 K/uL   RBC 5.17 4.22 - 5.81 MIL/uL   Hemoglobin 13.8 13.0 - 17.0 g/dL   HCT 67.6 19.5 - 09.3 %   MCV 77.4 (L) 80.0 - 100.0 fL   MCH 26.7 26.0 - 34.0 pg   MCHC 34.5 30.0 - 36.0 g/dL   RDW 26.7 12.4 - 58.0 %   Platelets 201 150 - 400 K/uL   nRBC 0.0 0.0 - 0.2 %    Comment:  Performed at [facility], [address] [place], [place] [address]  Differential     Status: None   Collection Time: 09/28/21  6:05 PM  Result Value Ref Range   Neutrophils Relative % 60 %   Neutro Abs 2.2 1.7 - 7.7 K/uL   Lymphocytes Relative 21 %   Lymphs Abs 0.8 0.7 - 4.0 K/uL   Monocytes Relative 12 %   Monocytes Absolute 0.5 0.1 - 1.0 K/uL   Eosinophils Relative 6 %   Eosinophils Absolute 0.2 0.0 - 0.5 K/uL   Basophils Relative 1 %   Basophils Absolute 0.0 0.0 - 0.1 K/uL   Immature Granulocytes 0 %   Abs Immature Granulocytes 0.01 0.00 - 0.07 K/uL    Comment: Performed at [facility], [address], [place], [place] [address]  Comprehensive metabolic panel     Status: Abnormal   Collection Time: 09/28/21  6:05 PM  Result Value Ref Range   Sodium 135 135 - 145 mmol/L   Potassium 3.0 (L) 3.5 - 5.1 mmol/L   Chloride 102 98 - 111 mmol/L   CO2 24 22 - 32 mmol/L   Glucose, Bld 127 (H) 70 - 99 mg/dL  Comment: Glucose reference range applies only to samples taken after fasting for at least 8 hours.   BUN 10 8 - 23 mg/dL   Creatinine, Ser 7.34 (H) 0.61 - 1.24 mg/dL   Calcium 8.5 (L) 8.9 - 10.3 mg/dL   Total Protein 6.7 6.5 - 8.1 g/dL   Albumin 3.6 3.5 - 5.0 g/dL   AST 21 15 - 41 U/L   ALT 16 0 - 44 U/L   Alkaline Phosphatase 50 38 - 126 U/L   Total Bilirubin 0.9 0.3 - 1.2 mg/dL   GFR, Estimated >19 >37 mL/min    Comment: (NOTE) Calculated using the CKD-EPI Creatinine Equation (2021)    Anion gap 9 5 - 15    Comment: Performed at [facility], [address] [place], [place] [address]  Calvin Jackson 8, ED     Status: Abnormal   Collection Time: 09/28/21  6:26 PM  Result Value Ref Range   Sodium 140 135 - 145 mmol/L   Potassium 3.0 (L) 3.5 - 5.1 mmol/L   Chloride 100 98 - 111 mmol/L   BUN 10 8 - 23 mg/dL   Creatinine, Ser 9.73 0.61 - 1.24 mg/dL   Glucose, Bld 532 (H) 70 - 99 mg/dL    Comment: Glucose reference range applies only to samples taken  after fasting for at least 8 hours.   Calcium, Ion 1.12 (L) 1.15 - 1.40 mmol/L   TCO2 25 22 - 32 mmol/L   Hemoglobin 15.0 13.0 - 17.0 g/dL   HCT 99.2 42.6 - 83.4 %  CBG monitoring, ED     Status: None   Collection Time: 09/29/21  9:10 AM  Result Value Ref Range   Glucose-Capillary 94 70 - 99 mg/dL    Comment: Glucose reference range applies only to samples taken after fasting for at least 8 hours.   Comment 1 Notify RN    Comment 2 Document in Chart     CT HEAD WO CONTRAST  Result Date: 09/28/2021 CLINICAL DATA:  Neurological deficit. EXAM: CT HEAD WITHOUT CONTRAST TECHNIQUE: Contiguous axial images were obtained from the base of the skull through the vertex without intravenous contrast. COMPARISON:  October 15, 2020 FINDINGS: Brain: There is mild cerebral atrophy with widening of the extra-axial spaces and ventricular dilatation. There are areas of decreased attenuation within the white matter tracts of the supratentorial brain, consistent with microvascular disease changes. Multiple bilateral basal ganglia lacunar infarcts are seen. An additional small chronic infarct is seen along the right frontal lobe and anterolateral aspect of the cerebellum on the right. Vascular: No hyperdense vessel or unexpected calcification. Skull: Normal. Negative for fracture or focal lesion. Sinuses/Orbits: Chronic thickening of the wall of the left maxillary sinus is seen. Other: None. IMPRESSION: 1. Generalized cerebral atrophy. 2. Multiple bilateral basal ganglia lacunar infarcts. 3. Additional small chronic infarct along the right frontal lobe and anterolateral aspect of the right cerebellum. 4. Chronic left maxillary sinus disease. Electronically Signed   By: Aram Candela M.D.   On: 09/28/2021 19:05   MR BRAIN WO CONTRAST  Result Date: 09/29/2021 CLINICAL DATA:  Neuro deficit, acute, stroke suspected. Weakness and recurrent falling. EXAM: MRI HEAD WITHOUT CONTRAST MRI CERVICAL SPINE WITHOUT CONTRAST  TECHNIQUE: Multiplanar, multiecho pulse sequences of the brain and surrounding structures, and cervical spine, to include the craniocervical junction and cervicothoracic junction, were obtained without intravenous contrast. COMPARISON:  Head CT yesterday. FINDINGS: MRI HEAD FINDINGS Brain: 7 mm acute infarction in the left anterior  para median pons. No other acute insult. Few old small vessel cerebellar infarctions. Cerebral hemispheres show advanced chronic ischemic changes with small vessel infarctions affecting the thalami I, basal ganglia and throughout the cerebral hemispheric white matter. There is an old right frontal cortical and subcortical infarction. No mass lesion, hemorrhage, hydrocephalus or extra-axial collection. Vascular: Major vessels at the base of the brain show flow. Skull and upper cervical spine: Negative Sinuses/Orbits: Inflammatory type changes of the left ethmoid sinuses and nasal passages. Question polyposis. Orbits negative. Other: None MRI CERVICAL SPINE FINDINGS Alignment: Straightening of the normal cervical lordosis. Vertebrae: No fracture or focal bone lesion. Cord: No primary cord lesion.  See below regarding stenosis. Posterior Fossa, vertebral arteries, paraspinal tissues: See results of brain MRI. Soft tissues of the neck appear unremarkable. Disc levels: Foramen magnum is widely patent.  C1-2 is unremarkable. C2-3: Minimal disc bulge.  No stenosis. C3-4: Endplate osteophytes and bulging of the disc. Spinal stenosis with AP diameter of the canal only 5 mm. Cord flattening with abnormal T2 signal, worse on the left, consistent with compressive myelopathy. Moderate bilateral foraminal stenosis. C4-5: Endplate osteophytes and broad-based herniation of disc material with slight caudal down turning. Spinal stenosis with AP diameter in the of the canal in the midline only 3.7 mm. Flattening of spinal cord with mild abnormal T2 signal consistent with compressive myelopathy. Bilateral  foraminal stenosis. C5-6: Endplate osteophytes and bulging of the disc more prominent towards the left. Spinal stenosis with AP diameter of the canal in the midline measuring 5.5 mm. Mild cord deformity. Bilateral foraminal stenosis. C6-7: Endplate osteophytes, bulging of the disc and a shallow left posterolateral disc herniation. AP diameter of the canal in the midline 8.2 mm. Mild flattening of the left side of the cord. Foraminal narrowing left more than right. C7-T1: Minimal disc bulge.  No compressive stenosis. IMPRESSION: Brain MRI: Acute 7 mm infarction in the left anterior para median pons. Extensive chronic ischemic changes elsewhere throughout the brain as outlined above. Cervical spine MRI: Severe cervical canal stenosis from C3-4 through C5-6. Cord flattening and deformity with abnormal T2 signal in the cord at C3-4 and C4-5, consistent with compressive myelopathy. C6-7 shows a left paracentral disc herniation with mild cord deformity on the left. Foraminal stenoses from C3-4 through C6-7 could cause neural compression as well. Electronically Signed   By: Paulina Fusi M.D.   On: 09/29/2021 09:44   MR Cervical Spine Wo Contrast  Result Date: 09/29/2021 CLINICAL DATA:  Neuro deficit, acute, stroke suspected. Weakness and recurrent falling. EXAM: MRI HEAD WITHOUT CONTRAST MRI CERVICAL SPINE WITHOUT CONTRAST TECHNIQUE: Multiplanar, multiecho pulse sequences of the brain and surrounding structures, and cervical spine, to include the craniocervical junction and cervicothoracic junction, were obtained without intravenous contrast. COMPARISON:  Head CT yesterday. FINDINGS: MRI HEAD FINDINGS Brain: 7 mm acute infarction in the left anterior para median pons. No other acute insult. Few old small vessel cerebellar infarctions. Cerebral hemispheres show advanced chronic ischemic changes with small vessel infarctions affecting the thalami I, basal ganglia and throughout the cerebral hemispheric white matter.  There is an old right frontal cortical and subcortical infarction. No mass lesion, hemorrhage, hydrocephalus or extra-axial collection. Vascular: Major vessels at the base of the brain show flow. Skull and upper cervical spine: Negative Sinuses/Orbits: Inflammatory type changes of the left ethmoid sinuses and nasal passages. Question polyposis. Orbits negative. Other: None MRI CERVICAL SPINE FINDINGS Alignment: Straightening of the normal cervical lordosis. Vertebrae: No fracture or focal bone lesion.  Cord: No primary cord lesion.  See below regarding stenosis. Posterior Fossa, vertebral arteries, paraspinal tissues: See results of brain MRI. Soft tissues of the neck appear unremarkable. Disc levels: Foramen magnum is widely patent.  C1-2 is unremarkable. C2-3: Minimal disc bulge.  No stenosis. C3-4: Endplate osteophytes and bulging of the disc. Spinal stenosis with AP diameter of the canal only 5 mm. Cord flattening with abnormal T2 signal, worse on the left, consistent with compressive myelopathy. Moderate bilateral foraminal stenosis. C4-5: Endplate osteophytes and broad-based herniation of disc material with slight caudal down turning. Spinal stenosis with AP diameter in the of the canal in the midline only 3.7 mm. Flattening of spinal cord with mild abnormal T2 signal consistent with compressive myelopathy. Bilateral foraminal stenosis. C5-6: Endplate osteophytes and bulging of the disc more prominent towards the left. Spinal stenosis with AP diameter of the canal in the midline measuring 5.5 mm. Mild cord deformity. Bilateral foraminal stenosis. C6-7: Endplate osteophytes, bulging of the disc and a shallow left posterolateral disc herniation. AP diameter of the canal in the midline 8.2 mm. Mild flattening of the left side of the cord. Foraminal narrowing left more than right. C7-T1: Minimal disc bulge.  No compressive stenosis. IMPRESSION: Brain MRI: Acute 7 mm infarction in the left anterior para median pons.  Extensive chronic ischemic changes elsewhere throughout the brain as outlined above. Cervical spine MRI: Severe cervical canal stenosis from C3-4 through C5-6. Cord flattening and deformity with abnormal T2 signal in the cord at C3-4 and C4-5, consistent with compressive myelopathy. C6-7 shows a left paracentral disc herniation with mild cord deformity on the left. Foraminal stenoses from C3-4 through C6-7 could cause neural compression as well. Electronically Signed   By: Paulina Fusi M.D.   On: 09/29/2021 09:44   DG Hip Unilat W or Wo Pelvis 2-3 Views Right  Result Date: 09/29/2021 CLINICAL DATA:  Fall and right hip pain. EXAM: DG HIP (WITH OR WITHOUT PELVIS) 2-3V RIGHT COMPARISON:  None. FINDINGS: No acute fracture or dislocation. No significant arthritic changes. The soft tissues are unremarkable. IMPRESSION: Negative. Electronically Signed   By: Elgie Collard M.D.   On: 09/29/2021 02:49    Review of Systems  Constitutional:  Positive for activity change.  Eyes: Negative.   Respiratory: Negative.    Cardiovascular: Negative.   Gastrointestinal: Negative.   Musculoskeletal:  Positive for gait problem.  Skin: Negative.   Allergic/Immunologic: Negative.   Neurological:  Positive for weakness.       Multiple falls 48hrs Difficulty with gait  Hematological: Negative.   Psychiatric/Behavioral: Negative.    Blood pressure (!) 160/81, pulse (!) 52, temperature 99.1 F (37.3 C), temperature source Oral, resp. rate 13, SpO2 97 %. Physical Exam Constitutional:      Comments: Facial asymmetry  HENT:     Head: Normocephalic.     Right Ear: External ear normal.     Left Ear: External ear normal.     Nose: Nose normal.     Mouth/Throat:     Mouth: Mucous membranes are moist.     Pharynx: Oropharynx is clear.  Eyes:     Extraocular Movements: Extraocular movements intact.     Conjunctiva/sclera: Conjunctivae normal.     Pupils: Pupils are equal, round, and reactive to light.   Cardiovascular:     Rate and Rhythm: Normal rate and regular rhythm.  Pulmonary:     Effort: Pulmonary effort is normal.     Breath sounds: Normal breath sounds.  Abdominal:  General: Abdomen is flat.     Palpations: Abdomen is soft.  Musculoskeletal:     Cervical back: Normal range of motion and neck supple.  Skin:    General: Skin is warm and dry.  Neurological:     Mental Status: He is alert and oriented to person, place, and time.     Cranial Nerves: Cranial nerve deficit present.     Motor: Weakness present.     Coordination: Coordination abnormal.     Comments: I did not assess gait  Psychiatric:        Mood and Affect: Mood normal.        Behavior: Behavior normal.        Thought Content: Thought content normal.        Judgment: Judgment normal.    Assessment/Plan: Calvin Jackson is a 60 y.o. male Who has sustained an acute cerebral infarction, with multiple falls, and cervical spinal cord stenosis. Due to the acute infarct no operative intervention planned at this time. Will await stroke evaluation.   Coletta Memos 09/29/2021, 1:59 PM

## 2021-09-30 ENCOUNTER — Encounter (HOSPITAL_COMMUNITY): Payer: Self-pay | Admitting: Internal Medicine

## 2021-09-30 ENCOUNTER — Other Ambulatory Visit: Payer: Self-pay

## 2021-09-30 DIAGNOSIS — Z8673 Personal history of transient ischemic attack (TIA), and cerebral infarction without residual deficits: Secondary | ICD-10-CM | POA: Diagnosis not present

## 2021-09-30 DIAGNOSIS — R29810 Facial weakness: Secondary | ICD-10-CM | POA: Diagnosis present

## 2021-09-30 DIAGNOSIS — K209 Esophagitis, unspecified without bleeding: Secondary | ICD-10-CM | POA: Diagnosis present

## 2021-09-30 DIAGNOSIS — E119 Type 2 diabetes mellitus without complications: Secondary | ICD-10-CM | POA: Diagnosis present

## 2021-09-30 DIAGNOSIS — I639 Cerebral infarction, unspecified: Secondary | ICD-10-CM

## 2021-09-30 DIAGNOSIS — I613 Nontraumatic intracerebral hemorrhage in brain stem: Secondary | ICD-10-CM | POA: Diagnosis present

## 2021-09-30 DIAGNOSIS — G8191 Hemiplegia, unspecified affecting right dominant side: Secondary | ICD-10-CM | POA: Diagnosis present

## 2021-09-30 DIAGNOSIS — K5900 Constipation, unspecified: Secondary | ICD-10-CM | POA: Diagnosis present

## 2021-09-30 DIAGNOSIS — U071 COVID-19: Secondary | ICD-10-CM | POA: Diagnosis present

## 2021-09-30 DIAGNOSIS — G8194 Hemiplegia, unspecified affecting left nondominant side: Secondary | ICD-10-CM | POA: Diagnosis present

## 2021-09-30 DIAGNOSIS — I10 Essential (primary) hypertension: Secondary | ICD-10-CM | POA: Diagnosis present

## 2021-09-30 DIAGNOSIS — R296 Repeated falls: Secondary | ICD-10-CM | POA: Diagnosis present

## 2021-09-30 DIAGNOSIS — Z7982 Long term (current) use of aspirin: Secondary | ICD-10-CM | POA: Diagnosis not present

## 2021-09-30 DIAGNOSIS — F17210 Nicotine dependence, cigarettes, uncomplicated: Secondary | ICD-10-CM | POA: Diagnosis present

## 2021-09-30 DIAGNOSIS — R21 Rash and other nonspecific skin eruption: Secondary | ICD-10-CM | POA: Diagnosis present

## 2021-09-30 DIAGNOSIS — E785 Hyperlipidemia, unspecified: Secondary | ICD-10-CM | POA: Diagnosis present

## 2021-09-30 DIAGNOSIS — M4802 Spinal stenosis, cervical region: Secondary | ICD-10-CM | POA: Diagnosis present

## 2021-09-30 DIAGNOSIS — N179 Acute kidney failure, unspecified: Secondary | ICD-10-CM | POA: Diagnosis present

## 2021-09-30 DIAGNOSIS — Z7902 Long term (current) use of antithrombotics/antiplatelets: Secondary | ICD-10-CM | POA: Diagnosis not present

## 2021-09-30 DIAGNOSIS — G9520 Unspecified cord compression: Secondary | ICD-10-CM | POA: Diagnosis present

## 2021-09-30 DIAGNOSIS — Z7984 Long term (current) use of oral hypoglycemic drugs: Secondary | ICD-10-CM | POA: Diagnosis not present

## 2021-09-30 DIAGNOSIS — Z79899 Other long term (current) drug therapy: Secondary | ICD-10-CM | POA: Diagnosis not present

## 2021-09-30 LAB — RAPID URINE DRUG SCREEN, HOSP PERFORMED
Amphetamines: NOT DETECTED
Barbiturates: NOT DETECTED
Benzodiazepines: NOT DETECTED
Cocaine: NOT DETECTED
Opiates: NOT DETECTED
Tetrahydrocannabinol: NOT DETECTED

## 2021-09-30 LAB — CBC
HCT: 39.8 % (ref 39.0–52.0)
Hemoglobin: 14 g/dL (ref 13.0–17.0)
MCH: 26.9 pg (ref 26.0–34.0)
MCHC: 35.2 g/dL (ref 30.0–36.0)
MCV: 76.4 fL — ABNORMAL LOW (ref 80.0–100.0)
Platelets: 185 10*3/uL (ref 150–400)
RBC: 5.21 MIL/uL (ref 4.22–5.81)
RDW: 14 % (ref 11.5–15.5)
WBC: 5.9 10*3/uL (ref 4.0–10.5)
nRBC: 0 % (ref 0.0–0.2)

## 2021-09-30 LAB — GLUCOSE, CAPILLARY
Glucose-Capillary: 104 mg/dL — ABNORMAL HIGH (ref 70–99)
Glucose-Capillary: 119 mg/dL — ABNORMAL HIGH (ref 70–99)
Glucose-Capillary: 122 mg/dL — ABNORMAL HIGH (ref 70–99)
Glucose-Capillary: 431 mg/dL — ABNORMAL HIGH (ref 70–99)
Glucose-Capillary: 99 mg/dL (ref 70–99)

## 2021-09-30 LAB — LACTATE DEHYDROGENASE: LDH: 163 U/L (ref 98–192)

## 2021-09-30 LAB — COMPREHENSIVE METABOLIC PANEL
ALT: 14 U/L (ref 0–44)
AST: 18 U/L (ref 15–41)
Albumin: 3.2 g/dL — ABNORMAL LOW (ref 3.5–5.0)
Alkaline Phosphatase: 58 U/L (ref 38–126)
Anion gap: 9 (ref 5–15)
BUN: 12 mg/dL (ref 8–23)
CO2: 23 mmol/L (ref 22–32)
Calcium: 8.2 mg/dL — ABNORMAL LOW (ref 8.9–10.3)
Chloride: 103 mmol/L (ref 98–111)
Creatinine, Ser: 1.31 mg/dL — ABNORMAL HIGH (ref 0.61–1.24)
GFR, Estimated: >60 mL/min (ref >60)
Glucose, Bld: 97 mg/dL (ref 70–99)
Potassium: 3.4 mmol/L — ABNORMAL LOW (ref 3.5–5.1)
Sodium: 135 mmol/L (ref 135–145)
Total Bilirubin: 0.8 mg/dL (ref 0.3–1.2)
Total Protein: 6.5 g/dL (ref 6.5–8.1)

## 2021-09-30 LAB — FERRITIN: Ferritin: 86 ng/mL (ref 24–336)

## 2021-09-30 LAB — RESP PANEL BY RT-PCR (FLU A&B, COVID) ARPGX2
Influenza A by PCR: NEGATIVE
Influenza B by PCR: NEGATIVE
SARS Coronavirus 2 by RT PCR: POSITIVE — AB

## 2021-09-30 LAB — C-REACTIVE PROTEIN: CRP: 0.7 mg/dL (ref <1.0)

## 2021-09-30 LAB — D-DIMER, QUANTITATIVE: D-Dimer, Quant: 1.03 ug/mL-FEU — ABNORMAL HIGH (ref 0.00–0.50)

## 2021-09-30 LAB — FIBRINOGEN: Fibrinogen: 290 mg/dL (ref 210–475)

## 2021-09-30 MED ORDER — ASPIRIN EC 81 MG PO TBEC
81.0000 mg | DELAYED_RELEASE_TABLET | Freq: Every day | ORAL | Status: DC
  Administered 2021-09-30 – 2021-10-06 (×7): 81 mg via ORAL
  Filled 2021-09-30 (×7): qty 1

## 2021-09-30 MED ORDER — LOSARTAN POTASSIUM 50 MG PO TABS
100.0000 mg | ORAL_TABLET | Freq: Every day | ORAL | Status: DC
  Administered 2021-09-30 – 2021-10-06 (×7): 100 mg via ORAL
  Filled 2021-09-30 (×8): qty 2

## 2021-09-30 MED ORDER — AMLODIPINE BESYLATE 10 MG PO TABS
10.0000 mg | ORAL_TABLET | Freq: Every day | ORAL | Status: DC
  Administered 2021-09-30 – 2021-10-06 (×7): 10 mg via ORAL
  Filled 2021-09-30 (×7): qty 1

## 2021-09-30 NOTE — Plan of Care (Signed)
  Problem: Education: Goal: Knowledge of General Education information will improve Description Including pain rating scale, medication(s)/side effects and non-pharmacologic comfort measures Outcome: Progressing   

## 2021-09-30 NOTE — Evaluation (Addendum)
Physical Therapy Evaluation Patient Details Name: Calvin Jackson MRN: [id number] DOB: [birth year] Today's Date: 09/30/2021  History of Present Illness  Pt is a 60 yo male presenting with worsening L side weakness. MRI revealed 7 mm acute infarct involving the L anterior paramedian pons. 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   Clinical Impression  Pt admitted with above diagnosis. Pt currently with functional limitations due to the deficits listed below (see PT Problem List). At the time of PT eval pt was able to perform transfers with up to mod assist and RW for support. Pt with decreased cognition and difficulty transferring due to R sided weakness. Recommend CIR level therapies to maximize functional independence and safety prior to return home. Acutely, pt will benefit from skilled PT to increase their independence and safety with mobility to allow discharge to the venue listed below.          Recommendations for follow up therapy are one component of a multi-disciplinary discharge planning process, led by the attending physician.  Recommendations may be updated based on patient status, additional functional criteria and insurance authorization.  Follow Up Recommendations Acute inpatient rehab (3hours/day)    Assistance Recommended at Discharge Frequent or constant Supervision/Assistance  Functional Status Assessment Patient has had a recent decline in their functional status and demonstrates the ability to make significant improvements in function in a reasonable and predictable amount of time.  Equipment Recommendations  Other (comment) (TBD by next venue of care)    Recommendations for Other Services Rehab consult     Precautions / Restrictions Precautions Precautions: Fall Precaution Comments: Incontinent of Bowel Restrictions Weight Bearing Restrictions: No      Mobility  Bed Mobility Overal bed mobility: Needs Assistance Bed Mobility:  Supine to Sit;Sit to Supine     Supine to sit: Mod assist Sit to supine: Min assist   General bed mobility comments: Mod A to assist with elevating trunk to sitting and bringing BLE off bed, and min assist for reutrning to bed.    Transfers Overall transfer level: Needs assistance Equipment used: Rolling walker (2 wheels) Transfers: Sit to/from [employer] Sit to Stand: Mod assist Stand pivot transfers: Mod assist         General transfer comment: Assist for power up to full stand and to place R hand on walker for support. Mod assist for balance support as pt took pivotal steps around to the [facility] and back to bed.    Ambulation/Gait             General Gait Details: Unable to progress to gait training this session due to fatigue and decreased safety.  Stairs            Wheelchair Mobility    Modified Rankin (Stroke Patients Only) Modified Rankin (Stroke Patients Only) Pre-Morbid Rankin Score: Slight disability Modified Rankin: Moderately severe disability     Balance Overall balance assessment: Needs assistance Sitting-balance support: Bilateral upper extremity supported;Feet supported Sitting balance-Leahy Scale: Poor Sitting balance - Comments: Pt leaning backwards and lost his balance x2 EOB Postural control: Posterior lean Standing balance support: Bilateral upper extremity supported;Reliant on assistive device for balance Standing balance-Leahy Scale: Poor Standing balance comment: Pt reliant on RW and external support as well as verbal cues to maintain upright posture and balance.  Pertinent Vitals/Pain Pain Assessment: No/denies pain    Home Living Family/patient expects to be discharged to:: Private residence Living Arrangements: Other relatives (Sister) Available Help at Discharge: Available PRN/intermittently Type of Home: House Home Access: Stairs to enter Entrance Stairs-Rails: Can  reach both Entrance Stairs-Number of Steps: 4   Home Layout: One level Home Equipment: None      Prior Function Prior Level of Function : Independent/Modified Independent;Working/employed             Mobility Comments: Used no DME to walk ADLs Comments: Independent     Hand Dominance   Dominant Hand: Right    Extremity/Trunk Assessment   Upper Extremity Assessment Upper Extremity Assessment: Defer to OT evaluation;RUE deficits/detail RUE Deficits / Details: 3/5 MMT grip, 4-/5 elbow flex and shoulder flex, mild apraxia. RUE Sensation: WNL RUE Coordination: decreased fine motor    Lower Extremity Assessment Lower Extremity Assessment: RLE deficits/detail RLE Deficits / Details: Decreased strength and coordination. Not formally assessed due to rush to bedside commode and following linen change/peri care before pt fatigued.    Cervical / Trunk Assessment Cervical / Trunk Assessment: Kyphotic  Communication   Communication: No difficulties  Cognition Arousal/Alertness: Awake/alert Behavior During Therapy: WFL for tasks assessed/performed Overall Cognitive Status: Impaired/Different from baseline                                 General Comments: Overall pt appears WFL, some safety awareness deficits        General Comments      Exercises     Assessment/Plan    PT Assessment Patient needs continued PT services  PT Problem List Decreased strength;Decreased activity tolerance;Decreased balance;Decreased mobility;Decreased knowledge of use of DME;Decreased safety awareness;Decreased knowledge of precautions;Pain       PT Treatment Interventions DME instruction;Gait training;Functional mobility training;Therapeutic activities;Neuromuscular re-education;Cognitive remediation;Balance training;Therapeutic exercise;Stair training;Patient/family education    PT Goals (Current goals can be found in the Care Plan section)  Acute Rehab PT Goals Patient  Stated Goal: Be able to get to rehab PT Goal Formulation: With patient Time For Goal Achievement: 10/14/21 Potential to Achieve Goals: Good    Frequency Min 4X/week   Barriers to discharge        Co-evaluation               AM-PAC PT "6 Clicks" Mobility  Outcome Measure Help needed turning from your back to your side while in a flat bed without using bedrails?: A Little Help needed moving from lying on your back to sitting on the side of a flat bed without using bedrails?: A Lot Help needed moving to and from a bed to a chair (including a wheelchair)?: A Lot Help needed standing up from a chair using your arms (e.g., wheelchair or bedside chair)?: A Lot Help needed to walk in hospital room?: A Lot Help needed climbing 3-5 steps with a railing? : Total 6 Click Score: 12    End of Session Equipment Utilized During Treatment: Gait belt Activity Tolerance: Patient limited by fatigue Patient left: in bed;with call bell/phone within reach;with bed alarm set Nurse Communication: Mobility status PT Visit Diagnosis: Unsteadiness on feet (R26.81);Hemiplegia and hemiparesis Hemiplegia - Right/Left: Right Hemiplegia - dominant/non-dominant: Dominant Hemiplegia - caused by: Cerebral infarction    Time: 1610-9604 PT Time Calculation (min) (ACUTE ONLY): 37 min   Charges:   PT Evaluation $PT Eval Moderate Complexity: 1 Mod PT Treatments $Gait  Training: 8-22 mins        Conni Slipper, PT, DPT Acute Rehabilitation Services Pager: [phone] Office: [phone]   Marylynn Pearson 09/30/2021, 1:51 PM

## 2021-09-30 NOTE — Hospital Course (Addendum)
[name] is a 60 y/o male with a PMHx of prior CVA, Type 2 Diabetes Mellitus, HTN, HLD who presents to the ED with c/o bilateral lower extremity weakness and admitted for acute CVA.  # Acute ischemia of the left anterior paramedian pons MRI brain with 7 mm acute infarct involving the left anterior paramedian pons. MRA head and neck unremarkable. TEE (LVEF 50-60) without embolic suspicion. A1c 5.6 and lipid panel with LDL 70. Counseled on risk factor modifications, including smoking cessation and medication adherence. Improving UE and LE weakness with mild left facial droop. Neurology/ stroke team followed.  - ASA load then aspirin 81 mg  - Plavix 75 mg qd - Atorvastatin 40 mg qd - Restarted home BP meds after 48 hours: losartan 100 and amlodipine 10  -- Frequent neuro checks  - PT/OT eval and treat    # Cervical Spine Stenosis w/ Myelopathy  Cervical MRI with severe spinal stenosis and cord compression involving C3-4 and C4-5.  Neurosurgery consulted, surgical plans deferred pending CVA evaluation/stabilization. Once stabilized, neurosurgery recommending OP follow up.   # COVID 19 Infection  Tested positive on 10/26. Pt is asymptomatic without SHOB, fever, chills, increased work of breathing, or N/V/D. He is satting well on RA. Inflammatory markers WNL. Will CTM.    # Hypertension  Allowed for permissive HTN for > 48 hours.  - Restart BP medications: losartan 100 and amlodipine 10 once out of permissive HTN window.    # T2DM A1c 5.9 in 10/2020. Repeat A1c 5.6 this admission. Does not take any DM medications  - SSI   # Esophagitis  CT abdomen/pelvis with for distal esophagitis. Etiology unknown, will need OP work-up with potential EGD given pt is high risk for malignancy in the setting of long term tobacco use. - Protonix 40 mg bid - Outpatient referral to GI   # Constipation  CT abdomen/pelvis with fecal action with rectosigmoid colon dilation up to 14 cm. Denies abdominal  pain. - Start daily MiraLAX and senna

## 2021-09-30 NOTE — Progress Notes (Signed)
Inpatient Rehabilitation Admissions Coordinator   Inpatient rehab prescreen received. Noted COVID + 09/29/2021. Patients are eligible to be considered for admit to the [facility] when cleared from airborne precautions by acute MD or Infectious disease regardless of onset day. Please clarify when airborne precautions. Please call me with any questions. I will not place rehab consult at this time.    Ottie Glazier, RN, MSN Rehab Admissions Coordinator [phone] 09/30/2021 12:49 PM

## 2021-09-30 NOTE — Evaluation (Signed)
Occupational Therapy Evaluation Patient Details Name: Calvin Jackson MRN: [id number] DOB: [birth year] Today's Date: 09/30/2021   History of Present Illness 60 yo male presenting with worsening L side weakness. 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   Clinical Impression   Pt admitted for concerns listed above. PTA pt reported that he was independent with all ADL's and IADL's, except for driving. At this time, pt presents with increased weakness, especially on the R side, decreased activity tolerance, decreased balance, and potential cognitive deficits. He is requiring min-mod A with all ADL's and functional mobility. Recommend CIR, as pt will benefit from intensive therapy to maximize his return to independence. OT will follow acutely.       Recommendations for follow up therapy are one component of a multi-disciplinary discharge planning process, led by the attending physician.  Recommendations may be updated based on patient status, additional functional criteria and insurance authorization.   Follow Up Recommendations  Acute inpatient rehab (3hours/day)    Assistance Recommended at Discharge Frequent or constant Supervision/Assistance  Functional Status Assessment  Patient has had a recent decline in their functional status and demonstrates the ability to make significant improvements in function in a reasonable and predictable amount of time.  Equipment Recommendations  Other (comment) (TBD)    Recommendations for Other Services Rehab consult     Precautions / Restrictions Precautions Precautions: Fall Precaution Comments: Incontinent of Bowel Restrictions Weight Bearing Restrictions: No      Mobility Bed Mobility Overal bed mobility: Needs Assistance Bed Mobility: Supine to Sit;Sit to Supine     Supine to sit: Mod assist Sit to supine: Mod  assist   General bed mobility comments: Mod A to assist with elevating trunk to sitting and bringing BLE off and on the bed    Transfers Overall transfer level: Needs assistance Equipment used: Rolling walker (2 wheels) Transfers: Sit to/from [employer] Transfers Sit to Stand: Mod assist Stand pivot transfers: Mod assist         General transfer comment: Mod A to power up and to steady. requiring max verbal cues to stand up tall and straighten his legs      Balance Overall balance assessment: Needs assistance Sitting-balance support: Bilateral upper extremity supported;Feet supported Sitting balance-Leahy Scale: Poor Sitting balance - Comments: Pt leaning backwards and lost his balance x2 EOB Postural control: Posterior lean Standing balance support: Bilateral upper extremity supported;Reliant on assistive device for balance Standing balance-Leahy Scale: Poor Standing balance comment: Pt reliant on RW and external support as well as verbal cues to maintain upright posture and balance.                           ADL either performed or assessed with clinical judgement   ADL Overall ADL's : Needs assistance/impaired Eating/Feeding: Set up;Sitting   Grooming: Set up;Sitting   Upper Body Bathing: Minimal assistance;Sitting   Lower Body Bathing: Moderate assistance;Sitting/lateral leans;Sit to/from stand   Upper Body Dressing : Minimal assistance;Sitting   Lower Body Dressing: Moderate assistance;Sitting/lateral leans;Sit to/from stand   Toilet Transfer: Moderate assistance;Stand-pivot   Toileting- Clothing Manipulation and Hygiene: Moderate assistance;Sitting/lateral lean;Sit to/from stand       Functional mobility during ADLs: Moderate assistance;Rolling walker (2 wheels) General ADL Comments: Pt with some incoordination present as well as increased weakness and balance deficits, requiring  min-mod A for all ADL's.     Vision Baseline  Vision/History: 1 Wears glasses Ability to See in Adequate Light: 0 Adequate Patient Visual Report: No change from baseline Vision Assessment?: No apparent visual deficits     Perception     Praxis      Pertinent Vitals/Pain Pain Assessment: No/denies pain     Hand Dominance Right   Extremity/Trunk Assessment Upper Extremity Assessment Upper Extremity Assessment: RUE deficits/detail RUE Deficits / Details: 3/5 MMT grip, 4-/5 elbow flex and shoulder flex, mild apraxia. RUE Sensation: WNL RUE Coordination: decreased fine motor   Lower Extremity Assessment Lower Extremity Assessment: Defer to PT evaluation   Cervical / Trunk Assessment Cervical / Trunk Assessment: Kyphotic   Communication Communication Communication: No difficulties   Cognition Arousal/Alertness: Awake/alert Behavior During Therapy: WFL for tasks assessed/performed Overall Cognitive Status: No family/caregiver present to determine baseline cognitive functioning                                 General Comments: Overall pt appears WFL, some safety awareness deficits     General Comments       Exercises     Shoulder Instructions      Home Living Family/patient expects to be discharged to:: Private residence Living Arrangements: Other relatives (Sister) Available Help at Discharge: Available PRN/intermittently Type of Home: House Home Access: Stairs to enter [employer] of Steps: 4 Entrance Stairs-Rails: Can reach both Home Layout: One level     Bathroom Shower/Tub: IT trainer: Standard Bathroom Accessibility: Yes How Accessible: Accessible via walker Home Equipment: None          Prior Functioning/Environment Prior Level of Function : Independent/Modified Independent;Working/employed             Mobility Comments: Used no DME to walk ADLs Comments: Independent        OT Problem List: Decreased strength;Decreased range of  motion;Decreased activity tolerance;Impaired balance (sitting and/or standing);Decreased coordination;Decreased safety awareness;Decreased knowledge of use of DME or AE;Impaired tone;Impaired UE functional use      OT Treatment/Interventions: Self-care/ADL training;Therapeutic exercise;Neuromuscular education;DME and/or AE instruction;Therapeutic activities;Cognitive remediation/compensation;Patient/family education;Balance training    OT Goals(Current goals can be found in the care plan section) Acute Rehab OT Goals Patient Stated Goal: To go to rehab OT Goal Formulation: With patient Time For Goal Achievement: 10/14/21 Potential to Achieve Goals: Good ADL Goals Pt Will Perform Grooming: with modified independence;standing Pt Will Perform Lower Body Bathing: with supervision;sitting/lateral leans;sit to/from stand Pt Will Perform Lower Body Dressing: with supervision;sitting/lateral leans;sit to/from stand Pt Will Transfer to Toilet: with min guard assist;ambulating Pt Will Perform Toileting - Clothing Manipulation and hygiene: with supervision;sitting/lateral leans;sit to/from stand Additional ADL Goal #1: Pt will maintain upright balance sitting EOB for 5 mins to prepare for seated ADL's.  OT Frequency: Min 2X/week   Barriers to D/C:            Co-evaluation              AM-PAC OT "6 Clicks" Daily Activity     Outcome Measure Help from another person eating meals?: A Little Help from another person taking care of personal grooming?: A Little Help from another person toileting, which includes using toliet, bedpan, or urinal?: A Lot Help from another person bathing (including washing, rinsing, drying)?: A Lot Help from another person to put on and taking off regular upper body clothing?: A  Little Help from another person to put on and taking off regular lower body clothing?: A Lot 6 Click Score: 15   End of Session Equipment Utilized During Treatment: Gait belt;Rolling  walker (2 wheels) Nurse Communication: Mobility status  Activity Tolerance: Patient tolerated treatment well Patient left: in bed;with call bell/phone within reach  OT Visit Diagnosis: Unsteadiness on feet (R26.81);Other abnormalities of gait and mobility (R26.89);Muscle weakness (generalized) (M62.81);Hemiplegia and hemiparesis Hemiplegia - Right/Left: Right Hemiplegia - dominant/non-dominant: Dominant Hemiplegia - caused by: Cerebral infarction                Time: 3151-7616 OT Time Calculation (min): 20 min Charges:  OT General Charges $OT Visit: 1 Visit OT Evaluation $OT Eval Moderate Complexity: 1 Mod  Calvin Guinther H., OTR/L Acute Rehabilitation  Calvin Jackson 09/30/2021, 12:07 PM

## 2021-09-30 NOTE — Progress Notes (Addendum)
STROKE TEAM PROGRESS NOTE   ATTENDING NOTE: I reviewed above note and agree with the assessment and plan. Pt was seen and examined.   60 year old male with history of diabetes, hypertension, hyperlipidemia, smoker admitted for imbalance on walking, right leg weakness, confusion and several falls at home.  In 10/2020, he was admitted for left-sided weakness.  CT showed old infarcts.  MRI showed right periventricular lacunar infarct, bilateral old subcortical infarcts, and right parietal small cortical infarct.  CTA head and neck right M1 and right P2 moderate stenosis.  EF 55 to 60%.  LDL 75, A1c 5.9.  Patient discharged on DAPT and Lipitor 40.  On this admission MRI showed left pontine infarct.  MRA head and neck unremarkable.  EF 55 to 60%, A1c 5.6, LDL 70, UDS negative.  Creatinine 1.31.  However, MRI C-spine showed C3-C6 spinal stenosis with cord compression, concerning for myelopathy.  On exam, patient awake alert but lethargic, orientated to place, age and time.  No aphasia, mild dysarthria, able to name repeat, follows some commands.  No gaze palsy, visual field fall.  Right facial droop.  Tongue protrusion midline.  Left upper and lower extremity at least 4/5.  Right upper extremity 3/5 with drift and right lower extremity proximal 3/5, distal 4 -/5. DTR bilaterally 2+ bicep, 1+ triceps, no Hoffmann sign bilaterally.  3+ patellar, 1+ Achilles, right Babinski positive, left Babinski negative.  Sensory symmetrical, no sensory level. Finger-to-nose on the left intact.    Etiology for patient stroke likely due to small vessel disease.  Recommend continue DAPT for stroke prevention.  Continue Lipitor 40.  Stroke risk factor modification including quit smoking.  Smoking cessation provided.  PT/OT recommend CIR.  Regarding MRI C-spine finding of myelopathy, my exam in indicating acute myelopathy, concerning for MRI finding likely chronic.  Currently neurosurgery on board, from stroke standpoint, okay  for urgent surgical procedure, recommend to avoid low BP perioperatively.  For elective surgical procedure, consider 6 months after acute stroke.  Of note, COVID was positive.  Seems to be asymptomatic.  Management per primary team.  For detailed assessment and plan, please refer to above as I have made changes wherever appropriate.   Neurology will sign off. Please call with questions. Pt will follow up with stroke clinic NP at [facility] in about 4 weeks. Thanks for the consult.   Marvel Plan, MD PhD Stroke Neurology 09/30/2021 6:50 PM    INTERVAL HISTORY No one is at the bedside.   Vitals:   09/29/21 2316 09/30/21 0424 09/30/21 0925 09/30/21 1238  BP: (!) 189/104 (!) 173/97 (!) 155/93 130/89  Pulse: 72 74 75 75  Resp: 16 15 18 19   Temp: 99.2 F (37.3 C) 99 F (37.2 C)    TempSrc: Oral Oral    SpO2: 97% 97% 98% 98%   CBC:  Recent Labs  Lab 09/28/21 1805 09/28/21 1826 09/30/21 0550  WBC 3.7*  --  5.9  NEUTROABS 2.2  --   --   HGB 13.8 15.0 14.0  HCT 40.0 44.0 39.8  MCV 77.4*  --  76.4*  PLT 201  --  185   Basic Metabolic Panel:  Recent Labs  Lab 09/28/21 1805 09/28/21 1826 09/30/21 0550  NA 135 140 135  K 3.0* 3.0* 3.4*  CL 102 100 103  CO2 24  --  23  GLUCOSE 127* 126* 97  BUN 10 10 12   CREATININE 1.32* 1.20 1.31*  CALCIUM 8.5*  --  8.2*    Lipid  Panel:  Recent Labs  Lab 09/29/21 1259  CHOL 128  TRIG 43  HDL 49  CHOLHDL 2.6  VLDL 9  LDLCALC 70    HgbA1c:  Recent Labs  Lab 09/29/21 1259  HGBA1C 5.6   Urine Drug Screen:  Recent Labs  Lab 09/30/21 0947  LABOPIA NONE DETECTED  COCAINSCRNUR NONE DETECTED  LABBENZ NONE DETECTED  AMPHETMU NONE DETECTED  THCU NONE DETECTED  LABBARB NONE DETECTED    Alcohol Level No results for input(s): ETH in the last 168 hours.  IMAGING past 24 hours MR ANGIO HEAD WO CONTRAST  Result Date: 09/29/2021 CLINICAL DATA:  Neuro deficit, acute stroke suspected EXAM: MRA HEAD WITHOUT CONTRAST TECHNIQUE:  Angiographic images of the Circle of Willis were acquired using MRA technique without intravenous contrast. COMPARISON:  Same-day brain MRI, CT a head/neck 10/15/2020 FINDINGS: Anterior circulation: The intracranial ICAs are patent. The bilateral MCAs are patent. Previously described moderate stenosis of the right M1 segment seen on the CTA of 10/15/2020 is not seen on the current study. The bilateral ACAs are patent. There is no aneurysm. Previously described outpouchings arising from the ophthalmic segments of the ICAs likely reflected the origins of the ophthalmic arteries. Posterior circulation: The bilateral V4 segments are patent to the level imaged. The basilar artery is patent. The bilateral PCAs are patent with mild irregularity of the distal branches on the left. Anatomic variants: The bilateral posterior communicating arteries are not identified. The proximal right ACA is diminutive with the distal branches supplied by a prominent branch off the left A2 segment. Other: None. IMPRESSION: Patent intracranial vasculature with mild atherosclerotic irregularity of the distal left PCA branches. Previously described moderate stenosis of the right M1 segment is not appreciated on the current study. Electronically Signed   By: Lesia Hausen M.D.   On: 09/29/2021 17:14   MR ANGIO NECK WO CONTRAST  Result Date: 09/29/2021 CLINICAL DATA:  Neuro deficit, acute stroke suspected EXAM: MRA NECK WITHOUT CONTRAST TECHNIQUE: Angiographic images of the neck were acquired using MRA technique without intravenous contrast. Carotid stenosis measurements (when applicable) are obtained utilizing NASCET criteria, using the distal internal carotid diameter as the denominator. COMPARISON:  CTA neck 10/15/2020 FINDINGS: Image quality is moderately degraded by motion artifact. Aortic arch: The aortic arch is not evaluated. Right carotid system: The right common, internal, and external carotid arteries are patent with antegrade  flow. There is significant motion artifact in the region of the carotid bulb, limiting evaluation for dissection or stenosis. Left carotid system: The left common, internal, and external carotid arteries are patent with antegrade flow. There is significant motion artifact in the region of the carotid bulb, limiting evaluation for dissection or stenosis. Vertebral arteries: The vertebral arteries are patent with antegrade flow. There is no definite stenosis or dissection. No aneurysm is seen. Other: None. IMPRESSION: Patent vasculature of the neck with no definite stenosis or dissection, within the confines of motion degraded images. Electronically Signed   By: Lesia Hausen M.D.   On: 09/29/2021 17:32   ECHOCARDIOGRAM COMPLETE  Result Date: 09/29/2021    ECHOCARDIOGRAM REPORT   Patient Name:   RAVINDRA BARANEK Date of Exam: 09/29/2021 Medical Rec #:  [id number]      Height:       70.0 in Accession #:    [id number]     Weight:       185.0 lb Date of Birth:  [birth year]      BSA:  2.019 m Patient Age:    60 years       BP:           198/97 mmHg Patient Gender: M              HR:           57 bpm. Exam Location:  Inpatient Procedure: 2D Echo, Cardiac Doppler and Color Doppler Indications:    Stroke  History:        Patient has prior history of Echocardiogram examinations, most                 recent 10/15/2020. Risk Factors:Hypertension, Current Smoker and                 Diabetes.  Sonographer:    Alvera Novel Referring Phys: [id number] JOSHUA S HONG IMPRESSIONS  1. Left ventricular ejection fraction, by estimation, is 55 to 60%. The left ventricle has normal function. The left ventricle has no regional wall motion abnormalities. Left ventricular diastolic parameters are consistent with Grade I diastolic dysfunction (impaired relaxation).  2. Right ventricular systolic function is normal. The right ventricular size is normal. Tricuspid regurgitation signal is inadequate for assessing PA pressure.  3. The  mitral valve is normal in structure. Trivial mitral valve regurgitation.  4. The aortic valve is tricuspid. Aortic valve regurgitation is trivial. No aortic stenosis is present.  5. The IVC was not visualized. FINDINGS  Left Ventricle: Left ventricular ejection fraction, by estimation, is 55 to 60%. The left ventricle has normal function. The left ventricle has no regional wall motion abnormalities. The left ventricular internal cavity size was normal in size. There is  no left ventricular hypertrophy. Left ventricular diastolic parameters are consistent with Grade I diastolic dysfunction (impaired relaxation). Right Ventricle: The right ventricular size is normal. No increase in right ventricular wall thickness. Right ventricular systolic function is normal. Tricuspid regurgitation signal is inadequate for assessing PA pressure. Left Atrium: Left atrial size was normal in size. Right Atrium: Right atrial size was normal in size. Pericardium: There is no evidence of pericardial effusion. Mitral Valve: The mitral valve is normal in structure. Trivial mitral valve regurgitation. Tricuspid Valve: The tricuspid valve is normal in structure. Tricuspid valve regurgitation is not demonstrated. Aortic Valve: The aortic valve is tricuspid. Aortic valve regurgitation is trivial. No aortic stenosis is present. Pulmonic Valve: The pulmonic valve was normal in structure. Pulmonic valve regurgitation is trivial. Aorta: The aortic root is normal in size and structure. Venous: The inferior vena cava was not well visualized. IAS/Shunts: No atrial level shunt detected by color flow Doppler.  LEFT VENTRICLE PLAX 2D LVIDd:         4.20 cm      Diastology LVIDs:         3.00 cm      LV e' medial:    6.96 cm/s LV PW:         1.10 cm      LV E/e' medial:  9.6 LV IVS:        1.10 cm      LV e' lateral:   11.40 cm/s LVOT diam:     1.90 cm      LV E/e' lateral: 5.9 LV SV:         52 LV SV Index:   26 LVOT Area:     2.84 cm  LV Volumes (MOD)  LV vol d, MOD A2C: 80.5 ml LV vol d, MOD A4C: 104.0  ml LV vol s, MOD A2C: 30.3 ml LV vol s, MOD A4C: 44.0 ml LV SV MOD A2C:     50.2 ml LV SV MOD A4C:     104.0 ml LV SV MOD BP:      57.2 ml RIGHT VENTRICLE RV S prime:     12.60 cm/s TAPSE (M-mode): 2.2 cm LEFT ATRIUM             Index        RIGHT ATRIUM           Index LA diam:        3.50 cm 1.73 cm/m   RA Area:     14.20 cm LA Vol (A2C):   51.7 ml 25.60 ml/m  RA Volume:   30.40 ml  15.05 ml/m LA Vol (A4C):   45.2 ml 22.38 ml/m LA Biplane Vol: 49.6 ml 24.56 ml/m  AORTIC VALVE             PULMONIC VALVE LVOT Vmax:   112.00 cm/s PR End Diast Vel: 2.31 msec LVOT Vmean:  72.700 cm/s LVOT VTI:    0.182 m  AORTA Ao Root diam: 3.20 cm Ao Asc diam:  3.50 cm MITRAL VALVE MV Area (PHT): 1.85 cm    SHUNTS MV Decel Time: 411 msec    Systemic VTI:  0.18 m MV E velocity: 66.90 cm/s  Systemic Diam: 1.90 cm MV A velocity: 59.20 cm/s MV E/A ratio:  1.13 Dalton McleanMD Electronically signed by Wilfred Lacy Signature Date/Time: 09/29/2021/5:03:34 PM    Final     PHYSICAL EXAM    ASSESSMENT/PLAN Mr. Kentrail Shew is a 60 y.o. male with history of  type 2 diabetes mellitus, hyperlipidemia, essential hypertension, tobacco dependence, stroke, and remote marijuana use who presented to the ED 10/25 for evaluation of imbalance with walking and multiple recent falls.   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 Generalized cerebral atrophy. 2. Multiple bilateral basal ganglia lacunar infarcts. 3. Additional small chronic infarct along the right frontal lobe and anterolateral aspect of the right cerebellum. 4. Chronic left maxillary sinus disease  MRI Brain:  Acute 7 mm infarction in the left anterior para median pons. Extensive chronic ischemic changes elsewhere throughout the Brain. MRA  head:  Patent intracranial vasculature with mild atherosclerotic irregularity of the distal left PCA branches. Previously  described moderate stenosis of the right M1 segment is not appreciated on the current study.  MRA neck:  Patent vasculature of the neck with no definite stenosis or dissection, within the confines of motion degraded images. 2D Echo: LVEF 55-60%, normal LA size, no IA shunt  LDL 70 HgbA1c 5.6 VTE prophylaxis - scd aspirin 81 mg daily and clopidogrel 75 mg daily prior to admission (not sure about compliance), now on aspirin 81 mg daily and clopidogrel 75 mg daily, continue DAPT on discharge. Therapy recommendations:  acute inpt rehab  Disposition:  pending  Cervical myelopathy MRI C-spine showed C3-C6 spinal stenosis with cord compression, concerning for myelopathy However, on exam no sign of myelopathy Likely chronic Neurosurgery on board From stroke standpoint, okay for urgent surgical procedure, recommend to avoid low BP perioperatively.  For elective surgical procedure, consider 6 months after acute stroke.  Hypertension Home meds:  hctz Stable Permissive hypertension (OK if < 220/120) but gradually normalize in 5-7 days Long-term BP goal normotensive  Hyperlipidemia Home meds:  lipitor 40mg  ,  resumed in hospital LDL 70, goal <  70 Continue statin at discharge  Diabetes type II Controlled Home meds:  metformin HgbA1c 5.6, goal < 7.0 CBGs SSI Close PCP follow-up as outpatient  Tobacco abuse Current smoker Smoking cessation counseling provided Pt is willing to quit  COVID COVID PCR test positive Asymptomatic Management per primary team  Other Stroke Risk Factors History of stroke   History of substance abuse  Other Active Problems # Esophagitis  - Protonix 40 mg twice daily - Outpatient referral to GI  # Constipation  Recent ED visit with CT abdomen/pelvis notable for fecal action with rectosigmoid colon dilation up to 14 cm.  Patient denying any abdominal pain at this time. - Start daily MiraLAX and senna   Hospital day # 0     To contact Stroke  Continuity provider, please refer to [email or web address]. After hours, contact General Neurology

## 2021-09-30 NOTE — Progress Notes (Addendum)
Subjective: No acute overnight events. Patient was seen at bedside during rounds today.   Pt reports feeling well, and that his strength appears improved compared to admission. Pt denies chest pain, fevers, SHOB. No complaints or concerns at this time.   Pt is updated on the plan for today, and all questions are addressed.   Objective:  Vital signs in last 24 hours: Vitals:   09/29/21 2015 09/29/21 2316 09/30/21 0424 09/30/21 0925  BP: (!) 182/93 (!) 189/104 (!) 173/97 (!) 155/93  Pulse: 60 72 74 75  Resp: 16 16 15 18   Temp:  99.2 F (37.3 C) 99 F (37.2 C)   TempSrc:  Oral Oral   SpO2: 96% 97% 97% 98%   Constitutional: alert, well-appearing, in NAD HENT: normocephalic, atraumatic, mucous membranes moist Cardiovascular: RRR, no m/r/g, non-edematous bilateral LE Pulmonary/Chest: normal work of breathing on RA, LCTAB Abdominal: soft, non-tender to palpation, non-distended MSK: reduced bulk and tone on right side  Neurological: A&O x 3, symmetric sensation, facial asymmetry noted with weakness on the left with smiling and puffing of cheeks, tongue is midline without atrophy or fasciculations, upper and lower left extremity strength 5/5, and upper and lower left extremity strength 4/5  Assessment/Plan:  Active Problems:   Acute CVA (cerebrovascular accident) (HCC)   Cord compression [facility])  Calvin Jackson is a 60 y/o male with a PMHx of prior CVA, Type 2 Diabetes Mellitus, HTN, HLD admitted for left anterior paramedian pons CVA.  # Acute ischemia of the left anterior paramedian pons Counseled on risk factor modifications, including smoking cessation and medication adherence. Improving UE and LE weakness with mild left facial droop. Stroke team signing off, recommending DAPT and continuing high intensity statin. CTM vitals. Continue PT therapy, pending discharge to CIR once out of COVID window.  - aspirin 81 mg  - Plavix 75 mg qd - Atorvastatin 40 mg qd - Losartan 100 and  Amlodipine 10  -- Frequent neuro checks  - PT/OT eval and treat    # Cervical Spine Stenosis w/ Myelopathy  Cervical MRI with severe spinal stenosis and cord compression involving C3-4 and C4-5.  Neurosurgery deferring until CVA evaluation.  - Spoke with Dr 68 from neurosurgery, no plans for surgical intervention at this time and will make follow up plans.   # COVID 19 Infection  Tested positive on 10/26. No respiratory symptoms, and he is satting well on RA. -- We will continue to monitor the pt; no treatment required currently.   # Hypertension  SBP 150's  this AM.  - Continue losartan 100 and amlodipine 10  # AKI  Bump in Cr from 1.3 -> 1.7 after starting Losartan 100 yesterday. Will CTM.  - BMP  # T2DM A1c 5.9 in 10/2020. Repeat A1c 5.6 this admission. Does not take any DM medications  - SSI   # Esophagitis  CT abdomen/pelvis with for distal esophagitis. Etiology unknown, will need OP work-up with potential EGD given pt is high risk for malignancy in the setting of long term tobacco use. - Protonix 40 mg bid - Outpatient referral to GI   # Constipation  CT abdomen/pelvis with fecal action with rectosigmoid colon dilation up to 14 cm. Denies abdominal pain. - Start daily MiraLAX and senna   Best Practices: Diet: Heart Healthy VTE: Enoxaparin IVF: None,None Code: Full   11/2020, MD  Internal Medicine Resident, PGY-1 [facility]  Pager: [phone] After 5pm on weekdays and 1pm on weekends: On  Call pager [phone]

## 2021-09-30 NOTE — Progress Notes (Signed)
  Date: 09/30/2021  Patient name: Calvin Jackson  Medical record number: [id number]  Date of birth: 19-May-1961   I have seen and evaluated Calvin Jackson and discussed their care with the Residency Team.  In brief, patient is a 60 year old male with past medical history of CVA, type 2 diabetes, hypertension, hyperlipidemia who presented to the ED with right-sided upper and lower extremity weakness x1 day.  Patient states that he was cleaning a shed when he developed new onset right lower extremity weakness and fell forward.  He tried to catch himself but noted that his right arm was weak as well.  He was able to stand up and make it back inside his house after 10 to 15 minutes.  He was sitting outside his house several hours later and noted that he was having difficulty standing up.  He managed to get up and go inside.  Yesterday, he was walking outside with his walker and noted worsening right lower extremity weakness and came into the ED for further evaluation.  Today, patient states that he feels well and that the strength is improving.  PMHx, Fam Hx, and/or Soc Hx : As per resident admit note  Vitals:   09/30/21 0925 09/30/21 1238  BP: (!) 155/93 130/89  Pulse: 75 75  Resp: 18 19  Temp:    SpO2: 98% 98%   General: Awake, alert, oriented x3, NAD CVS: Regular rate and rhythm, normal heart sounds Lungs: CTA bilaterally Abdomen: Soft, nontender, nondistended, normoactive bowel sounds noted Extremities: No edema noted, nontender to palpation Psych: Normal mood and affect HEENT: Normocephalic, atraumatic Neuro: Power 5 out of 5 left upper and lower extremity, 4 out of 5 right lower upper and lower extremity, sensation intact,, tongue is midline, shoulder shrug is symmetrical, tongue central, sensation intact   Assessment and Plan: I have seen and evaluated the patient as outlined above. I agree with the formulated Assessment and Plan as detailed in the residents' note, with the following  changes:   1.  Acute CVA: -Patient presented to the ED with right upper and lower extremity weakness x1 day and was found to have an acute infarct of the left anterior paramedian pons on MRI.  Patient is at high risk for recurrent strokes (especially small vessel disease) given his history of hypertension, prior stroke as well as current tobacco use. -Continue with aspirin and Plavix x3 weeks and then Plavix alone -We will continue with high intensity statin (atorvastatin 40 mg daily) -Patient was restarted on his home blood pressure medications today.  We will monitor blood pressure closely -PT/OT recommending CIR -2D echo results noted with normal EF and grade 1 diastolic dysfunction -U tox is negative -Stroke team to follow-up today -Continue to monitor on telemetry -Neuro follow-up and recommendations appreciated -Of note, patient also had severe spinal stenosis at C3-C4 and C5-C6 with compressive myelopathy features.  He was evaluated by neurosurgery.  No operative intervention planned at this time given his acute CVA.  2.  Acute COVID-19 infection: -Patient was also noted to incidentally be positive for COVID on this admission.  He has no respiratory symptoms at this time and his inflammatory markers are not elevated with the exception of a D-dimer of 1.03 -We will continue to monitor the patient closely.  No treatment required currently -No further work-up for now  Earl Lagos, MD 10/27/20222:23 PM

## 2021-09-30 NOTE — TOC CAGE-AID Note (Signed)
Transition of Care [facility]) - CAGE-AID Screening   Patient Details  Name: [name] MRN: [id number] Date of Birth: 29-Nov-1961  Transition of Care [facility]) CM/SW Contact:    Kiam Bransfield C Tarpley-Carter, LCSWA Phone Number: 09/30/2021, 2:31 PM   Clinical Narrative: Pt participated in Cage-Aid.  Pt stated he does not use substance or ETOH.  Pt smokes cigarettes.  Pt was offered resources, due to smoking cigarettes.  Carroll Ranney Tarpley-Carter, MSW, LCSW-A Pronouns:  She/Her/Hers [facility]itions of Care Clinical Social Worker Direct Number:  [phone] [email or web address]   CAGE-AID Screening:    Have You Ever Felt You Ought to Cut Down on Your Drinking or Drug Use?: No Have People Annoyed You By [employer] Your Drinking Or Drug Use?: No Have You Felt Bad Or Guilty About Your Drinking Or Drug Use?: No Have You Ever Had a Drink or Used Drugs First Thing In The Morning to Steady Your Nerves or to Get Rid of a Hangover?: No CAGE-AID Score: 0  Substance Abuse Education Offered: No (Pt smokes cirgarettes.)  Substance abuse interventions: Transport planner

## 2021-09-30 NOTE — Evaluation (Signed)
Speech Language Pathology Evaluation Patient Details Name: Calvin Jackson MRN: [id number] DOB: 1961/08/05 Today's Date: 09/30/2021 Time: 6213-0865 SLP Time Calculation (min) (ACUTE ONLY): 27 min  Problem List:  Patient Active Problem List   Diagnosis Date Noted   Cord compression [facility])    Acute CVA (cerebrovascular accident) (HCC) 10/15/2020   Marijuana abuse 10/15/2020   Tobacco dependence 10/15/2020   Essential hypertension    Dyslipidemia    Diabetes mellitus with neurological manifestation [facility])    Past Medical History:  Past Medical History:  Diagnosis Date   Diabetes mellitus with neurological manifestation (HCC)    Dyslipidemia    Essential hypertension    Marijuana abuse 10/15/2020   Stroke (HCC)    Tobacco dependence 10/15/2020   Past Surgical History: History reviewed. No pertinent surgical history. HPI:  60 y/o male with a PMHx of prior CVA, Type 2 Diabetes Mellitus, HTN, HLD who presented to the ED on 09/29/21 with c/o bilateral lower extremity weakness. Mr. Dinovo stated on the afternoon of 09/28/21, around 2  pm, he was cleaning his shed when he had sudden onset lower extremity weakness that was worse on the right than the left. Due to the weakness, he fell forward and when he tried to catch himself, he noticed his right arm was weak. After laying on the ground for 10-15 minutes, Mr. Pfiester stated he was able to get back up and make it inside his home. Several hours later, he was sitting outside and realized he was not able to stand up on his own. After attempting for a while, he states he "willed himself" to stand up. The rest of his day was uneventful. On 09/29/21, he was walking outside with his walker and fell due to right lower leg weakness. At this point, he decided to come into the hospital; 09/29/21, MRI brain indicated Acute 7 mm infarction in the left anterior para median  pons. Extensive chronic ischemic changes elsewhere throughout the brain.  SLE generated  d/t code stroke.  Passed [facility] swallow screen.   Assessment / Plan / Recommendation Clinical Impression  Pt assessed via SLUMS ([employer] Mental Status Examination) with a score of 14/26 obtained with graphic expression deleted from assessment due to dominant hand affected and legibility compromised d/t proprioceptive/sensory deficits.  Attention/memory impacted with tasks including repeating digits backwards, simple calculation, naming items within category and following multi-step directives.  Pt with mildly dysarthric speech judged to be 75% intelligible within simple conversation.  OME revealed slight right facial deviation and incoordination/reduced strength with rapid oral movements.  Auditory comprehension task with recall of a paragraph yielded 75% accuracy.  Pt able to name items confrontationally and in categories given min categorization cues.  ST will f/u while in acute setting for speech/cognitive deficits while in acute setting.  Thank you for this consult.    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  Recommendations may be updated based on patient status, additional functional criteria and insurance authorization.    Follow Up Recommendations  Inpatient Rehab    Frequency and Duration min 2x/week  1 week      SLP Evaluation Cognition  Overall Cognitive Status: Impaired/Different from baseline Arousal/Alertness: Awake/alert Orientation Level: Oriented to person;Oriented to  place;Oriented to situation;Oriented to time Year: 2022 Month: October Day of Week:  Incorrect Attention: Sustained Sustained Attention: Impaired Sustained Attention Impairment: Verbal basic;Functional basic Memory: Impaired Memory Impairment: Retrieval deficit;Decreased recall of new information;Decreased short term memory Decreased Short Term Memory: Verbal basic;Functional basic Immediate Memory Recall: Sock;Blue;Bed Memory Recall Sock: Without Cue Memory Recall Blue: With Cue Memory Recall Bed: With Cue Awareness: Appears intact Behaviors: Perseveration Safety/Judgment: Impaired Comments: Pt stated he could return to work, but later acknowledged he probably needed therapy to strengthen his right side.       Comprehension  Auditory Comprehension Overall Auditory Comprehension: Impaired Yes/No Questions: Within Functional Limits Commands: Impaired Multistep Basic Commands: 25-49% accurate Conversation: Simple Interfering Components: Processing speed;Working [employer] fast streamer;Attention EffectiveTechniques: Repetition;Extra processing time Visual Recognition/Discrimination Discrimination: Not tested Reading Comprehension Reading Status: Not tested    Expression Expression Primary Mode of Expression: Verbal Verbal Expression Overall Verbal Expression: Appears within functional limits for tasks assessed Level of Generative/Spontaneous Verbalization: Conversation Repetition: No impairment Naming: No impairment Interfering Components: Attention Non-Verbal Means of Communication: Not applicable Written Expression Dominant Hand: Right Written Expression: Not tested   Oral / Motor  Oral Motor/Sensory Function Overall Oral Motor/Sensory Function: Mild impairment Facial Strength: Reduced right;Suspected CN VII (facial) dysfunction Lingual Strength: Reduced Motor Speech Overall Motor Speech: Appears within functional limits for tasks assessed Respiration: Within functional limits Phonation: Normal Resonance: Within functional limits Articulation: Impaired Level of  Impairment: Conversation Intelligibility: Intelligibility reduced Word: 75-100% accurate Phrase: 75-100% accurate Sentence: 75-100% accurate Conversation: 50-74% accurate Motor Planning: Witnin functional limits Motor Speech Errors: Not applicable                       Tressie Stalker, M.S., CCC-SLP 09/30/2021, 1:50 PM

## 2021-10-01 DIAGNOSIS — I639 Cerebral infarction, unspecified: Secondary | ICD-10-CM | POA: Diagnosis not present

## 2021-10-01 DIAGNOSIS — G9520 Unspecified cord compression: Secondary | ICD-10-CM | POA: Diagnosis not present

## 2021-10-01 LAB — CBC
HCT: 39 % (ref 39.0–52.0)
Hemoglobin: 14.2 g/dL (ref 13.0–17.0)
MCH: 27.5 pg (ref 26.0–34.0)
MCHC: 36.4 g/dL — ABNORMAL HIGH (ref 30.0–36.0)
MCV: 75.4 fL — ABNORMAL LOW (ref 80.0–100.0)
Platelets: 161 10*3/uL (ref 150–400)
RBC: 5.17 MIL/uL (ref 4.22–5.81)
RDW: 13.8 % (ref 11.5–15.5)
WBC: 6 10*3/uL (ref 4.0–10.5)
nRBC: 0 % (ref 0.0–0.2)

## 2021-10-01 LAB — COMPREHENSIVE METABOLIC PANEL
ALT: 14 U/L (ref 0–44)
AST: 19 U/L (ref 15–41)
Albumin: 2.9 g/dL — ABNORMAL LOW (ref 3.5–5.0)
Alkaline Phosphatase: 49 U/L (ref 38–126)
Anion gap: 8 (ref 5–15)
BUN: 17 mg/dL (ref 8–23)
CO2: 25 mmol/L (ref 22–32)
Calcium: 8.1 mg/dL — ABNORMAL LOW (ref 8.9–10.3)
Chloride: 104 mmol/L (ref 98–111)
Creatinine, Ser: 1.75 mg/dL — ABNORMAL HIGH (ref 0.61–1.24)
GFR, Estimated: 43 mL/min — ABNORMAL LOW (ref >60)
Glucose, Bld: 84 mg/dL (ref 70–99)
Potassium: 3.8 mmol/L (ref 3.5–5.1)
Sodium: 137 mmol/L (ref 135–145)
Total Bilirubin: 0.8 mg/dL (ref 0.3–1.2)
Total Protein: 6 g/dL — ABNORMAL LOW (ref 6.5–8.1)

## 2021-10-01 LAB — D-DIMER, QUANTITATIVE: D-Dimer, Quant: 0.67 ug/mL-FEU — ABNORMAL HIGH (ref 0.00–0.50)

## 2021-10-01 LAB — GLUCOSE, CAPILLARY
Glucose-Capillary: 101 mg/dL — ABNORMAL HIGH (ref 70–99)
Glucose-Capillary: 133 mg/dL — ABNORMAL HIGH (ref 70–99)
Glucose-Capillary: 176 mg/dL — ABNORMAL HIGH (ref 70–99)
Glucose-Capillary: 134 mg/dL — ABNORMAL HIGH (ref 70–99)
Glucose-Capillary: 138 mg/dL — ABNORMAL HIGH (ref 70–99)

## 2021-10-01 LAB — C-REACTIVE PROTEIN: CRP: 1.6 mg/dL — ABNORMAL HIGH (ref <1.0)

## 2021-10-01 LAB — LACTATE DEHYDROGENASE: LDH: 165 U/L (ref 98–192)

## 2021-10-01 LAB — FIBRINOGEN: Fibrinogen: 281 mg/dL (ref 210–475)

## 2021-10-01 LAB — FERRITIN: Ferritin: 111 ng/mL (ref 24–336)

## 2021-10-01 NOTE — TOC Initial Note (Signed)
Transition of Care [facility]) - Initial/Assessment Note    Patient Details  Name: Calvin Jackson MRN: [id number] Date of Birth: 1961/11/13  Transition of Care [facility]) CM/SW Contact:    Baldemar Lenis, LCSW Phone Number: 10/01/2021, 2:03 PM  Clinical Narrative:        CSW spoke with patient about [facility], and patient is agreeable for referral to be sent. [facility] can offer a bed for patient pending disposition. CSW discussed with patient about concerns with living with his sister, and patient says she can just be unpredictable sometimes but he is going to go back and live with her at discharge. He is also looking on whether he can go stay with his nephew, but he doesn't know about that yet. CSW updated [facility] and they will initiate insurance authorization request through [facility]. CSW to follow.           Expected Discharge Plan: IP Rehab Facility Barriers to Discharge: Continued Medical Work up, English as a second language teacher   Patient Goals and CMS Choice Patient states their goals for this hospitalization and ongoing recovery are:: to get rehab and get better CMS Medicare.gov Compare Post Acute Care list provided to:: Patient Choice offered to / list presented to : Patient  Expected Discharge Plan and Services Expected Discharge Plan: IP Rehab Facility     Post Acute Care Choice: IP Rehab Living arrangements for the past 2 months: Single Family Home                                      Prior Living Arrangements/Services Living arrangements for the past 2 months: Single Family Home Lives with:: Siblings Patient language and need for interpreter reviewed:: No Do you feel safe going back to the place where you live?: Yes      Need for Family Participation in Patient Care: No (Comment) Care giver support system in place?: Yes (comment)   Criminal Activity/Legal Involvement Pertinent to Current Situation/Hospitalization: No - Comment as needed  Activities of Daily  Living Home Assistive Devices/Equipment: Dan Humphreys (specify type) ADL Screening (condition at time of admission) Patient's cognitive ability adequate to safely complete daily activities?: Yes Is the patient deaf or have difficulty hearing?: No Does the patient have difficulty seeing, even when wearing glasses/contacts?: Yes Does the patient have difficulty concentrating, remembering, or making decisions?: No Patient able to express need for assistance with ADLs?: Yes Does the patient have difficulty dressing or bathing?: No Independently performs ADLs?: Yes (appropriate for developmental age) Does the patient have difficulty walking or climbing stairs?: Yes Weakness of Legs: Both Weakness of Arms/Hands: None  Permission Sought/Granted Permission sought to share information with : Facility Industrial/product designer granted to share information with : Yes, Verbal Permission Granted     Permission granted to share info w AGENCY: [facility]        Emotional Assessment   Attitude/Demeanor/Rapport: Engaged Affect (typically observed): Appropriate Orientation: : Oriented to Self, Oriented to Place, Oriented to  Time, Oriented to Situation Alcohol / Substance Use: Not Applicable Psych Involvement: No (comment)  Admission diagnosis:  Cord compression (HCC) [G95.20] Acute CVA (cerebrovascular accident) (HCC) [I63.9] Cerebrovascular accident (CVA), unspecified mechanism (HCC) [I63.9] Patient Active Problem List   Diagnosis Date Noted   Cord compression [facility])    Acute CVA (cerebrovascular accident) (HCC) 10/15/2020   Marijuana abuse 10/15/2020   Tobacco dependence 10/15/2020   Essential hypertension  Dyslipidemia    Diabetes mellitus with neurological manifestation [facility])    PCP:  Hilbert Corrigan Chales Abrahams, NP Pharmacy:   [facility] DEPARTMENT - [place], [place] - [address] [place] [address] Phone: [phone] Fax:  [phone]     Social Determinants of Health (SDOH) Interventions    Readmission Risk Interventions No flowsheet data found.

## 2021-10-01 NOTE — Plan of Care (Signed)
  Problem: Education: Goal: Knowledge of General Education information will improve Description: Including pain rating scale, medication(s)/side effects and non-pharmacologic comfort measures Outcome: Progressing   Problem: Health Behavior/Discharge Planning: Goal: Ability to manage health-related needs will improve Outcome: Progressing   Problem: Clinical Measurements: Goal: Ability to maintain clinical measurements within normal limits will improve Outcome: Progressing Goal: Will remain free from infection Outcome: Progressing Goal: Diagnostic test results will improve Outcome: Progressing Goal: Respiratory complications will improve Outcome: Progressing Goal: Cardiovascular complication will be avoided Outcome: Progressing   Problem: Activity: Goal: Risk for activity intolerance will decrease Outcome: Progressing   Problem: Nutrition: Goal: Adequate nutrition will be maintained Outcome: Progressing   Problem: Coping: Goal: Level of anxiety will decrease Outcome: Progressing   Problem: Elimination: Goal: Will not experience complications related to bowel motility Outcome: Progressing Goal: Will not experience complications related to urinary retention Outcome: Progressing   Problem: Pain Managment: Goal: General experience of comfort will improve Outcome: Progressing   Problem: Safety: Goal: Ability to remain free from injury will improve Outcome: Progressing   Problem: Skin Integrity: Goal: Risk for impaired skin integrity will decrease Outcome: Progressing   Problem: Education: Goal: Knowledge of disease or condition will improve Outcome: Progressing Goal: Knowledge of secondary prevention will improve (SELECT ALL) Outcome: Progressing Goal: Knowledge of patient specific risk factors will improve (INDIVIDUALIZE FOR PATIENT) Outcome: Progressing Goal: Individualized Educational Video(s) Outcome: Progressing   Problem: Coping: Goal: Will verbalize  positive feelings about self Outcome: Progressing Goal: Will identify appropriate support needs Outcome: Progressing   Problem: Health Behavior/Discharge Planning: Goal: Ability to manage health-related needs will improve Outcome: Progressing   Problem: Nutrition: Goal: Risk of aspiration will decrease Outcome: Progressing

## 2021-10-01 NOTE — Progress Notes (Signed)
Physical Therapy Treatment Patient Details Name: Calvin Jackson MRN: [id number] DOB: 07/18/1961 Today's Date: 10/01/2021   History of Present Illness Pt is a 60 yo male presenting with worsening L side weakness. MRI revealed 7 mm acute infarct involving the L anterior paramedian pons. 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    PT Comments    Pt received in supine, agreeable to therapy session and with good progress toward goals this date. Pt able to perform short gait task at bedside with RW and up to modA, distance limited due to fatigue. Pt also performed seated and standing exercises (standing within Stedy frame for safety) but no overt buckling with standing marches and mini squats this date. Pt able to assist with standing peri-care due to bowel incontinence this date with U UE support. Pt continues to benefit from PT services to progress toward functional mobility goals.  Recommendations for follow up therapy are one component of a multi-disciplinary discharge planning process, led by the attending physician.  Recommendations may be updated based on patient status, additional functional criteria and insurance authorization.  Follow Up Recommendations  Acute inpatient rehab (3hours/day)     Assistance Recommended at Discharge Frequent or constant Supervision/Assistance  Equipment Recommendations  Other (comment) (TBD by next venue of care)    Recommendations for Other Services Rehab consult     Precautions / Restrictions Precautions Precautions: Fall Precaution Comments: airborne/contact precs; bowel incontinence Restrictions Weight Bearing Restrictions: No     Mobility  Bed Mobility Overal bed mobility: Needs Assistance Bed Mobility: Supine to Sit;Sit to Supine     Supine to sit: Min assist Sit to supine: Mod assist   General bed mobility comments: with cues for sequencing and heavy reliance on bed rail, pt minA for trunk rise/anterior  sit scooting but needs increased assist for BLE over EOB and safety with sit>supine    Transfers Overall transfer level: Needs assistance Equipment used: Rolling walker (2 wheels) Transfers: Sit to/from Stand Sit to Stand: Min assist;From elevated surface    General transfer comment: Assist for power up to full stand x 5 reps within [place], then stands from EOB x3 reps to RW. Transfer via Lift Equipment: Stedy  Ambulation/Gait Ambulation/Gait assistance: Min assist;Mod assist Gait Distance (Feet): 10 Feet (70ft forward/backward stepping, seated break, then 20ft in a circle at bedside with RW) Assistive device: Rolling walker (2 wheels) Gait Pattern/deviations: Step-to pattern;Decreased step length - right;Decreased weight shift to right;Shuffle;Decreased dorsiflexion - right;Decreased dorsiflexion - left;Trunk flexed;Wide base of support Gait velocity: decreased   General Gait Details: short gait trials x2 at bedside, pt moderately fatigued after standing exercises/transfer training and with limited tolerance for gait, needs manual assist to manage RW and max cues for improved posture/step length and height      Modified Rankin (Stroke Patients Only) Modified Rankin (Stroke Patients Only) Pre-Morbid Rankin Score: Slight disability Modified Rankin: Moderately severe disability     Balance Overall balance assessment: Needs assistance Sitting-balance support: Bilateral upper extremity supported;Feet supported Sitting balance-Leahy Scale: Poor Sitting balance - Comments: posterior lean with dynamic seated tasks but notable though delayed righting reactions, able to self-correct with cues and BUE support/hip strategy   Standing balance support: Bilateral upper extremity supported;Reliant on assistive device for balance Standing balance-Leahy Scale: Poor Standing balance comment: Pt reliant on RW and external support as well as verbal cues to maintain upright posture and balance.            Cognition  Arousal/Alertness: Awake/alert Behavior During Therapy: WFL for tasks assessed/performed Overall Cognitive Status: No family/caregiver present to determine baseline cognitive functioning        General Comments: Overall pt appears WFL, some safety awareness deficits and pt unaware of some bowel incontinence, slow processing but participatory        Exercises Other Exercises Other Exercises: seated BLE AROM: hip flexion, LAQ, ankle pumps x10 reps ea Other Exercises: standing BLE AROM: heel raises, hip flexion, mini squats x10 reps ea Other Exercises: STS x 5 reps with BUE Stedy support    General Comments General comments (skin integrity, edema, etc.): VSS on RA throughout, RN notified tele beeping although all leads in place      Pertinent Vitals/Pain Pain Assessment: No/denies pain     PT Goals (current goals can now be found in the care plan section) Acute Rehab PT Goals Patient Stated Goal: to walk PT Goal Formulation: With patient Time For Goal Achievement: 10/14/21 Progress towards PT goals: Progressing toward goals    Frequency    Min 4X/week      PT Plan Current plan remains appropriate       AM-PAC PT "6 Clicks" Mobility   Outcome Measure  Help needed turning from your back to your side while in a flat bed without using bedrails?: A Little Help needed moving from lying on your back to sitting on the side of a flat bed without using bedrails?: A Lot Help needed moving to and from a bed to a chair (including a wheelchair)?: A Lot Help needed standing up from a chair using your arms (e.g., wheelchair or bedside chair)?: A Lot Help needed to walk in hospital room?: A Lot Help needed climbing 3-5 steps with a railing? : Total 6 Click Score: 12    End of Session Equipment Utilized During Treatment: Gait belt Activity Tolerance: Patient tolerated treatment well Patient left: in bed;with call bell/phone within reach;with bed alarm  set Nurse Communication: Mobility status;Other (comment) (can pivot to [facility] with +1 assist and would like to get up to chair if possible, floor sticky with possible urine needs to be mopped) PT Visit Diagnosis: Unsteadiness on feet (R26.81);Hemiplegia and hemiparesis Hemiplegia - Right/Left: Right Hemiplegia - dominant/non-dominant: Dominant Hemiplegia - caused by: Cerebral infarction     Time: 1644-1710 PT Time Calculation (min) (ACUTE ONLY): 26 min  Charges:  $Gait Training: 8-22 mins $Therapeutic Exercise: 8-22 mins                     Maquita Sandoval P., PTA Acute Rehabilitation Services Pager: [phone] Office: [phone]    Angus Palms 10/01/2021, 6:32 PM

## 2021-10-01 NOTE — Progress Notes (Signed)
Inpatient Rehabilitation Admissions Coordinator   I will place rehab consult order per protocol to fully assess potential for CIR admit . If not a candidate, other rehab venues would be recommended for possible admit. [facility] does accept patients on airborne precautions.  Ottie Glazier, RN, MSN Rehab Admissions Coordinator [phone] 10/01/2021 1:35 PM

## 2021-10-02 DIAGNOSIS — I639 Cerebral infarction, unspecified: Secondary | ICD-10-CM | POA: Diagnosis not present

## 2021-10-02 LAB — BASIC METABOLIC PANEL
Anion gap: 8 (ref 5–15)
BUN: 18 mg/dL (ref 8–23)
CO2: 23 mmol/L (ref 22–32)
Calcium: 8.2 mg/dL — ABNORMAL LOW (ref 8.9–10.3)
Chloride: 106 mmol/L (ref 98–111)
Creatinine, Ser: 1.5 mg/dL — ABNORMAL HIGH (ref 0.61–1.24)
GFR, Estimated: 52 mL/min — ABNORMAL LOW (ref >60)
Glucose, Bld: 123 mg/dL — ABNORMAL HIGH (ref 70–99)
Potassium: 4 mmol/L (ref 3.5–5.1)
Sodium: 137 mmol/L (ref 135–145)

## 2021-10-02 LAB — CBC
HCT: 39.9 % (ref 39.0–52.0)
Hemoglobin: 13.8 g/dL (ref 13.0–17.0)
MCH: 26.7 pg (ref 26.0–34.0)
MCHC: 34.6 g/dL (ref 30.0–36.0)
MCV: 77.2 fL — ABNORMAL LOW (ref 80.0–100.0)
Platelets: 189 10*3/uL (ref 150–400)
RBC: 5.17 MIL/uL (ref 4.22–5.81)
RDW: 14 % (ref 11.5–15.5)
WBC: 4.2 10*3/uL (ref 4.0–10.5)
nRBC: 0 % (ref 0.0–0.2)

## 2021-10-02 LAB — GLUCOSE, CAPILLARY
Glucose-Capillary: 116 mg/dL — ABNORMAL HIGH (ref 70–99)
Glucose-Capillary: 107 mg/dL — ABNORMAL HIGH (ref 70–99)
Glucose-Capillary: 215 mg/dL — ABNORMAL HIGH (ref 70–99)
Glucose-Capillary: 94 mg/dL (ref 70–99)

## 2021-10-02 NOTE — Progress Notes (Signed)
Inpatient Rehab Admissions Coordinator:   TOC pursuing [facility] for Pt. CIR will follow at a distance for now.   Megan Salon, MS, CCC-SLP Rehab Admissions Coordinator  [phone] (celll) [phone] (office)

## 2021-10-02 NOTE — Plan of Care (Signed)

## 2021-10-02 NOTE — Progress Notes (Signed)
   Subjective: No acute overnight events.  States feel well.  Denies any new or acute complaints this morning.  Denies any chest pain, shortness of breath, cough or chills discussed plan for CIR at [facility] and awaiting insurance authorization for this.  Remains agreeable to this plan.  Objective:  Vital signs in last 24 hours: Vitals:   10/01/21 1700 10/01/21 2053 10/01/21 2347 10/02/21 0413  BP: 131/77 136/85 134/81 (!) 150/94  Pulse: 68 (!) 59 62 69  Resp: 17 16  17   Temp: 98.3 F (36.8 C) 98.8 F (37.1 C) 98.6 F (37 C) 98 F (36.7 C)  TempSrc: Oral Oral Oral Oral  SpO2: 98% 99% 100% 97%   Constitutional: alert, well-appearing, in NAD HENT: normocephalic, atraumatic, mucous membranes moist Cardiovascular: RRR, no m/r/g, non-edematous bilateral LE Pulmonary/Chest: normal work of breathing on RA, LCTAB Abdominal: soft, non-tender to palpation, mild distention MSK: reduced bulk and tone  Neurological: A&O x 3, sensation intact, decreased strength on left side of face.  4 out of 5 strength of right upper and lower extremity.  5 out of strength of left upper and lower extremity.  Speech is fluent with no dysarthria.  Assessment/Plan:  Active Problems:   Acute CVA (cerebrovascular accident) [facility])   Cord compression [facility])  Mr. Mister Krahenbuhl is a 60 y/o male with a PMHx of prior CVA, Type 2 Diabetes Mellitus, HTN, HLD admitted for left anterior paramedian pons CVA.  # Acute ischemia of the left anterior paramedian pons 66 continues to do well.  Right upper and lower extremity strength slowly improving.  Does still have some facial asymmetry on the left side.  Continues to be stable for discharge pending insurance authorization for CIR. - aspirin 81 mg  - Plavix 75 mg qd for 3 weeks - Atorvastatin 40 mg qd - Losartan 100 and Amlodipine 10  --Continue to encourage smoking cessation. - PT/OT recommending inpatient rehab. --Pending insurance authorization for CIR at [facility]  #  Cervical Spine Stenosis w/ Myelopathy  Cervical MRI with severe spinal stenosis and cord compression involving C3-4 and C4-5.  Neurosurgery deferring until CVA evaluation.  - Outpatient neurosurgery follow up to be arranged  # COVID 19 Infection  Tested positive on 10/26. No respiratory symptoms, and he is satting well on RA. -- We will continue to monitor the pt; no treatment required currently.   # Hypertension  Bps improved to 130s  - Continue losartan 100 and amlodipine 10  # AKI  Creatinine improved from 1.75-1.5 this morning.  Likely in setting of restarting his losartan. -Trend BMP  # T2DM A1c 5.9 in 10/2020. Repeat A1c 5.6 this admission. Does not take any DM medications  - SSI   # Esophagitis  CT abdomen/pelvis with for distal esophagitis. Etiology unknown, will need OP work-up with potential EGD given pt is high risk for malignancy in the setting of long term tobacco use. - Protonix 40 mg bid - Outpatient referral to GI   # Constipation  CT abdomen/pelvis with fecal action with rectosigmoid colon dilation up to 14 cm. Denies abdominal pain.  Mild distention on exam this morning.  Patient reports good bowel movements will continue on bowel regimen.   Best Practices: Diet: Heart Healthy VTE: Enoxaparin IVF: None Code: Full  11/2020, MD  Internal Medicine Resident, PGY-2 [facility]  Pager: [phone] After 5pm on weekdays and 1pm on weekends: On Call pager [phone]

## 2021-10-03 LAB — CBC
HCT: 39.2 % (ref 39.0–52.0)
Hemoglobin: 13.6 g/dL (ref 13.0–17.0)
MCH: 26.5 pg (ref 26.0–34.0)
MCHC: 34.7 g/dL (ref 30.0–36.0)
MCV: 76.4 fL — ABNORMAL LOW (ref 80.0–100.0)
Platelets: 190 10*3/uL (ref 150–400)
RBC: 5.13 MIL/uL (ref 4.22–5.81)
RDW: 13.7 % (ref 11.5–15.5)
WBC: 4.4 10*3/uL (ref 4.0–10.5)
nRBC: 0 % (ref 0.0–0.2)

## 2021-10-03 LAB — GLUCOSE, CAPILLARY
Glucose-Capillary: 120 mg/dL — ABNORMAL HIGH (ref 70–99)
Glucose-Capillary: 154 mg/dL — ABNORMAL HIGH (ref 70–99)
Glucose-Capillary: 84 mg/dL (ref 70–99)
Glucose-Capillary: 144 mg/dL — ABNORMAL HIGH (ref 70–99)

## 2021-10-03 NOTE — Progress Notes (Signed)
Subjective: Patient reports feeling well.  Was able to walk a little bit backwards to the bathroom with some difficulty due to right lower extremity weakness but otherwise feels that his strength is slowly improving.  Denies any cough, shortness of breath, fever, chills, or wheezing.  Discussed that we are pending insurance authorization for possible placement for inpatient rehabilitation at [facility].  Patient eager to participate in inpatient rehab and get back to walking again.  Objective:  Vital signs in last 24 hours: Vitals:   10/03/21 0317 10/03/21 0723 10/03/21 0958 10/03/21 1132  BP: (!) 127/92 135/77 (!) 146/87 120/71  Pulse: 61 67 (!) 51 (!) 55  Resp: 18 19 18 18   Temp: 98.2 F (36.8 C) 98.3 F (36.8 C) 97.8 F (36.6 C) 98.5 F (36.9 C)  TempSrc: Oral Oral Oral Oral  SpO2: 99% 98% 99% 100%   Constitutional: Well-appearing, sitting up in bed in no acute distress HENT: normocephalic, atraumatic, mucous membranes moist Cardiovascular: RRR, no m/r/g, non-edematous bilateral LE Pulmonary/Chest: Lungs clear to auscultation bilaterally.  No increased work of breathing Abdominal: soft, non-tender to palpation, mild distention MSK: reduced bulk and tone  Neurological: A&O x 3, mild left-sided facial droop and weakness.  4+ out of 5 strength of right upper and lower extremities normal strength on the left side.  Sensation symmetrical and intact  Assessment/Plan:  Active Problems:   Acute CVA (cerebrovascular accident) [facility])   Cord compression [facility])  Mr. Akiem Urieta is a 60 y/o male with a PMHx of prior CVA, Type 2 Diabetes Mellitus, HTN, HLD admitted for left anterior paramedian pons CVA.  # Acute ischemia of the left anterior paramedian pons Mr. Mccamy continues to be doing well.  Has been up and walking little bit in the room and is eager to continue to progress at inpatient rehab discussed that we are pending insurance authorization.  Strength seems to be improving on  his right side. - aspirin 81 mg  - Plavix 75 mg qd for 3 weeks - Atorvastatin 40 mg qd - Losartan 100 and Amlodipine 10  --Continue to encourage smoking cessation. - PT/OT recommending inpatient rehab. --Pending insurance authorization for CIR at [facility]  # Cervical Spine Stenosis w/ Myelopathy  Cervical MRI with severe spinal stenosis and cord compression involving C3-4 and C4-5.  Neurosurgery deferring until CVA evaluation.  - Outpatient neurosurgery follow up to be arranged  # COVID 19 Infection  Tested positive on 10/26. No respiratory symptoms, and he is satting well on RA.  No treatments given patient has been asymptomatic.  Patient reports having had 2 doses of COVID-vaccine and booster.   # Hypertension  Bps improved to 130s  - Continue losartan 100 and amlodipine 10  # AKI  BP is well controlled we will need to continue to monitor BMPs while losartan given mild bump with after restarting  # T2DM A1c 5.9 in 10/2020. Repeat A1c 5.6 this admission. Does not take any DM medications well controlled in the hospital. - SSI   # Esophagitis  CT abdomen/pelvis with for distal esophagitis. Etiology unknown, will need OP work-up with potential EGD given pt is high risk for malignancy in the setting of long term tobacco use. - Protonix 40 mg bid - Outpatient referral to GI   # Constipation  CT abdomen/pelvis with fecal action with rectosigmoid colon dilation up to 14 cm. Denies abdominal pain.  Mild distention on exam this morning.  Patient reports good bowel movements will continue on bowel regimen.  Best Practices: Diet: Heart Healthy VTE: Enoxaparin IVF: None Code: Full  Melanee Spry, MD  Internal Medicine Resident, PGY-2 [facility]  Pager: [phone] After 5pm on weekdays and 1pm on weekends: On Call pager [phone]

## 2021-10-04 DIAGNOSIS — G9520 Unspecified cord compression: Secondary | ICD-10-CM | POA: Diagnosis not present

## 2021-10-04 DIAGNOSIS — I639 Cerebral infarction, unspecified: Secondary | ICD-10-CM | POA: Diagnosis not present

## 2021-10-04 LAB — BASIC METABOLIC PANEL
Anion gap: 6 (ref 5–15)
BUN: 17 mg/dL (ref 8–23)
CO2: 25 mmol/L (ref 22–32)
Calcium: 8.2 mg/dL — ABNORMAL LOW (ref 8.9–10.3)
Chloride: 107 mmol/L (ref 98–111)
Creatinine, Ser: 1.33 mg/dL — ABNORMAL HIGH (ref 0.61–1.24)
GFR, Estimated: >60 mL/min (ref >60)
Glucose, Bld: 75 mg/dL (ref 70–99)
Potassium: 4 mmol/L (ref 3.5–5.1)
Sodium: 138 mmol/L (ref 135–145)

## 2021-10-04 LAB — GLUCOSE, CAPILLARY
Glucose-Capillary: 89 mg/dL (ref 70–99)
Glucose-Capillary: 96 mg/dL (ref 70–99)
Glucose-Capillary: 117 mg/dL — ABNORMAL HIGH (ref 70–99)
Glucose-Capillary: 155 mg/dL — ABNORMAL HIGH (ref 70–99)

## 2021-10-04 NOTE — Progress Notes (Signed)
SLP Cancellation Note  Patient Details Name: Calvin Jackson MRN: [id number] DOB: [birth year]   Cancelled treatment:       Reason Eval/Treat Not Completed: Patient at procedure or test/unavailable   Tressie Stalker, M.S., CCC-SLP 10/04/2021, 4:59 PM

## 2021-10-04 NOTE — Progress Notes (Signed)
Physical Therapy Treatment Patient Details Name: Calvin Jackson MRN: [id number] DOB: 12/09/1960 Today's Date: 10/04/2021   History of Present Illness Pt is a 60 yo male presenting with worsening L side weakness. MRI revealed 7 mm acute infarct involving the L anterior paramedian pons. 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    PT Comments    Patient progressing with mobility and able to ambulate further in the room this session.  Self limited as offered further, but chose to head to recliner.  Still limited by awareness of deficits and high risk for falls.  Continues to be appropriate for inpatient rehab stay prior to d/c home.  Patient eager to return to independent.  PT to continue to follow acutely.    Recommendations for follow up therapy are one component of a multi-disciplinary discharge planning process, led by the attending physician.  Recommendations may be updated based on patient status, additional functional criteria and insurance authorization.  Follow Up Recommendations  Acute inpatient rehab (3hours/day)     Assistance Recommended at Discharge Frequent or constant Supervision/Assistance  Equipment Recommendations  Rolling walker (2 wheels)    Recommendations for Other Services       Precautions / Restrictions Precautions Precautions: Fall Precaution Comments: airborne/contact precs; bowel incontinence     Mobility  Bed Mobility Overal bed mobility: Needs Assistance Bed Mobility: Supine to Sit;Rolling Rolling: Min guard   Supine to sit: Min assist     General bed mobility comments: cues for technique and using rail to roll to apply brief due to stool incontinence; A for lines and leg off the bed using rails to assist trunk upright    Transfers Overall transfer level: Needs assistance Equipment used: Rolling walker (2 wheels) Transfers: Sit to/from Stand Sit to Stand: Min assist           General transfer comment: cues  for hand placement and assist for balance up from EOB, then up from recliner    Ambulation/Gait Ambulation/Gait assistance: Mod assist Gait Distance (Feet): 30 Feet Assistive device: Rolling walker (2 wheels) Gait Pattern/deviations: Step-to pattern;Trunk flexed;Decreased weight shift to right;Narrow base of support     General Gait Details: cues for feet apart and increased time for R LE progression and some lateral hip instability in stance on R, assist to manage RW and maneuver around bed to recliner   Stairs             Wheelchair Mobility    Modified Rankin (Stroke Patients Only) Modified Rankin (Stroke Patients Only) Pre-Morbid Rankin Score: Slight disability Modified Rankin: Moderately severe disability     Balance Overall balance assessment: Needs assistance   Sitting balance-Leahy Scale: Poor Sitting balance - Comments: on EOB extended time due to MD visit once sitting and needed min A at times due to posterior lean as MD listens to lungs and tests strength   Standing balance support: Bilateral upper extremity supported;Reliant on assistive device for balance Standing balance-Leahy Scale: Poor Standing balance comment: cues for posture, assist for balance in standing                            Cognition Arousal/Alertness: Awake/alert Behavior During Therapy: WFL for tasks assessed/performed Overall Cognitive Status: No family/caregiver present to determine baseline cognitive functioning  General Comments: lack of awareness with bowel incontinence, balance issues at EOB and slow to follow commands from MD for "deep breaths" for ascultation        Exercises      General Comments General comments (skin integrity, edema, etc.): standing for perineal hygiene due to fecal incontinence      Pertinent Vitals/Pain Pain Assessment: No/denies pain    Home Living                           Prior Function            PT Goals (current goals can now be found in the care plan section) Progress towards PT goals: Progressing toward goals    Frequency    Min 4X/week      PT Plan Current plan remains appropriate    Co-evaluation              AM-PAC PT "6 Clicks" Mobility   Outcome Measure  Help needed turning from your back to your side while in a flat bed without using bedrails?: A Little Help needed moving from lying on your back to sitting on the side of a flat bed without using bedrails?: A Little Help needed moving to and from a bed to a chair (including a wheelchair)?: A Lot Help needed standing up from a chair using your arms (e.g., wheelchair or bedside chair)?: A Lot Help needed to walk in hospital room?: A Lot Help needed climbing 3-5 steps with a railing? : Total 6 Click Score: 13    End of Session Equipment Utilized During Treatment: Gait belt Activity Tolerance: Patient tolerated treatment well Patient left: in chair;with call bell/phone within reach;with chair alarm set   PT Visit Diagnosis: Unsteadiness on feet (R26.81);Hemiplegia and hemiparesis Hemiplegia - Right/Left: Right Hemiplegia - dominant/non-dominant: Dominant Hemiplegia - caused by: Cerebral infarction     Time: 9735-3299 PT Time Calculation (min) (ACUTE ONLY): 24 min  Charges:  $Gait Training: 8-22 mins $Therapeutic Activity: 8-22 mins                     Sheran Lawless, PT Acute Rehabilitation Services [phone] Office:[phone] 10/04/2021    Elray Mcgregor 10/04/2021, 4:19 PM

## 2021-10-04 NOTE — Progress Notes (Signed)
   Subjective: No acute overnight events. Patient was seen at bedside during rounds today.   Denies any cough, shortness of breath, fever, chills, or wheezing. Discussed that we are pending insurance authorization for possible placement for inpatient rehabilitation at [facility]. Patient eager to participate in inpatient rehab and get back to walking again.  Objective:  Vital signs in last 24 hours: Vitals:   10/03/21 1637 10/03/21 2000 10/04/21 0000 10/04/21 0400  BP: (!) 144/78 123/75 116/65 133/69  Pulse: (!) 55 (!) 56 (!) 57 (!) 58  Resp: 16 18 18 18   Temp: 98.4 F (36.9 C) 98.6 F (37 C) 98.5 F (36.9 C) 98.6 F (37 C)  TempSrc: Oral Oral Oral Oral  SpO2: 100% 99% 97% 95%   Constitutional: Well-appearing, sitting up in bed in no acute distress HENT: normocephalic, atraumatic, mucous membranes moist Cardiovascular: RRR, no m/r/g, non-edematous bilateral LE Pulmonary/Chest: Lungs clear to auscultation bilaterally.  No increased work of breathing Abdominal: soft, non-tender to palpation, mild distention MSK: reduced bulk and tone  Neurological: A&O x 3, mild left-sided facial droop and weakness. 4/5 strength of right upper and lower extremities,  5/5 strength on the left side. Sensation symmetrical and intact  Assessment/Plan:  Active Problems:   Acute CVA (cerebrovascular accident) [facility])   Cord compression [facility])  [name] is a 60 y/o male with a PMHx of prior CVA, Type 2 Diabetes Mellitus, HTN, HLD admitted for left anterior paramedian pons CVA.   # Acute ischemia of the left anterior paramedian pons Continues to make progress; walks in the room and is eager to continue to progress at inpatient rehab. Strength seems to be improving on his right side. Medically stable for d/c, pending insurance auth for CIR at [facility] and anticipate d/c tmrw. - aspirin 81 mg  - Plavix 75 mg qd for 3 weeks - Atorvastatin 40 mg qd - Losartan 100 and Amlodipine 10  --Continue to  encourage smoking cessation. - PT/OT recommending inpatient rehab. --Pending insurance authorization for CIR at [facility]  # Cervical Spine Stenosis w/ Myelopathy  Cervical MRI with severe spinal stenosis and cord compression involving C3-4 and C4-5.  Neurosurgery deferring surgery at the moment given recent CVA.  - Outpatient neurosurgery follow up to be arranged  # COVID 19 Infection  Tested positive on 10/26. No respiratory symptoms, and he is satting well on RA.  No treatments given patient has been asymptomatic. Patient reports having had 2 doses of COVID-vaccine and booster.   # Hypertension  Bps improved to 130s  - Continue losartan 100 and amlodipine 10  # AKI, resolved   Cr 1.33, now at baseline. - CTM BMP   # T2DM A1c 5.9 in 10/2020. Repeat A1c 5.6 this admission. Does not take any DM medications well controlled in the hospital. - SSI   # Esophagitis  CT abdomen/pelvis with for distal esophagitis. Etiology unknown, will need OP work-up with potential EGD given pt is high risk for malignancy in the setting of long term tobacco use. - Protonix 40 mg bid - Outpatient referral to GI   # Constipation  CT abdomen/pelvis with fecal action with rectosigmoid colon dilation up to 14 cm. Denies abdominal pain. Mild distention on exam this morning. Patient reports good bowel movements will continue on bowel regimen.   Best Practices: Diet: Heart Healthy VTE: Enoxaparin IVF: None Code: Full  11/2020, MD  Internal Medicine Resident, PGY-1 [facility]  Pager: [phone] 6:43 AM, 10/04/2021

## 2021-10-05 DIAGNOSIS — I639 Cerebral infarction, unspecified: Secondary | ICD-10-CM | POA: Diagnosis not present

## 2021-10-05 LAB — GLUCOSE, CAPILLARY
Glucose-Capillary: 81 mg/dL (ref 70–99)
Glucose-Capillary: 93 mg/dL (ref 70–99)
Glucose-Capillary: 135 mg/dL — ABNORMAL HIGH (ref 70–99)
Glucose-Capillary: 133 mg/dL — ABNORMAL HIGH (ref 70–99)

## 2021-10-05 NOTE — Progress Notes (Addendum)
PT Cancellation Note  Patient Details Name: Kayton Dunaj MRN: [id number] DOB: Sep 26, 1961   Cancelled Treatment:    Reason Eval/Treat Not Completed: (P) Other (comment) Pt needing clean-up and nursing assisting, will continue efforts next date per PT plan of care; did not have time to reattempt today.   Jaydeen Darley M Braleigh [place] 10/05/2021, 6:07 PM

## 2021-10-05 NOTE — Progress Notes (Signed)
Inpatient Rehab Admissions Coordinator:   Pt. Pursuing AIR at [facility], pending insurance. CIR will continue to follow at a distance.   Megan Salon, MS, CCC-SLP Rehab Admissions Coordinator  [phone] (celll) [phone] (office)

## 2021-10-05 NOTE — Progress Notes (Signed)
   Subjective: No acute overnight events. Patient was seen at bedside during rounds today.   Denies cough, shortness of breath, fever, chills, wheezing. Discussed that we are pending insurance authorization for possible placement for inpatient rehabilitation at [facility]. Patient eager to participate in inpatient rehab and get back to walking again.  Objective:  Vital signs in last 24 hours: Vitals:   10/05/21 0112 10/05/21 0400 10/05/21 0746 10/05/21 1151  BP: 133/82 136/69 136/67 137/84  Pulse: 67 63 (!) 52 (!) 55  Resp: 14 16 18 18   Temp: (!) 97.5 F (36.4 C) 98.3 F (36.8 C) 98.3 F (36.8 C) 98.1 F (36.7 C)  TempSrc: Axillary Oral Oral Oral  SpO2: 99% 100% 97% 100%    Constitutional: Well-appearing, sitting up in bed in no acute distress HENT: normocephalic, atraumatic, mucous membranes moist Cardiovascular: RRR, no m/r/g, non-edematous bilateral LE Pulmonary/Chest: Lungs clear to auscultation bilaterally.  No increased work of breathing Abdominal: soft, non-tender to palpation, mild distention MSK: reduced bulk and tone  Neurological: A&O x 3, mild left-sided facial droop and weakness. 4/5 strength of right upper and lower extremities,  5/5 strength on the left side. Sensation symmetrical and intact  Assessment/Plan:  Active Problems:   Acute CVA (cerebrovascular accident) [facility])   Cord compression [facility])  [name] is a 60 y/o male with a PMHx of prior CVA, Type 2 Diabetes Mellitus, HTN, HLD admitted for left anterior paramedian pons CVA.   # Acute ischemia of the left anterior paramedian pons Continues to make progress; walks in the room and is eager to continue to progress at inpatient rehab. Strength seems to be improving on his right side. Medically stable for d/c, pending insurance auth for CIR at [place]. - aspirin 81 mg  - Plavix 75 mg qd for 3 weeks - Atorvastatin 40 mg qd - Losartan 100 and Amlodipine 10  --Continue to encourage smoking cessation. -  PT/OT recommending inpatient rehab. --Pending insurance authorization for CIR at [facility]  # Cervical Spine Stenosis w/ Myelopathy  Cervical MRI with severe spinal stenosis and cord compression involving C3-4 and C4-5.  Neurosurgery deferring surgery at the moment given recent CVA.  - Outpatient neurosurgery follow up to be arranged  # COVID 19 Infection  Tested positive on 10/26. No respiratory symptoms, and he is satting well on RA.  No treatments given patient has been asymptomatic. Patient reports having had 2 doses of COVID-vaccine and booster.   # Hypertension  Bps improved to 130s  - Continue losartan 100 and amlodipine 10  # AKI, resolved   Cr 1.33, now at baseline. - CTM BMP   # T2DM A1c 5.9 in 10/2020. Repeat A1c 5.6 this admission. Does not take any DM medications well controlled in the hospital. - SSI   # Esophagitis  CT abdomen/pelvis with for distal esophagitis. Etiology unknown, will need OP work-up with potential EGD given pt is high risk for malignancy in the setting of long term tobacco use. - Protonix 40 mg bid - Outpatient referral to GI   # Constipation  CT abdomen/pelvis with fecal action with rectosigmoid colon dilation up to 14 cm. Denies abdominal pain. Mild distention on exam this morning. Patient reports good bowel movements will continue on bowel regimen.   Best Practices: Diet: Heart Healthy VTE: Enoxaparin IVF: None Code: Full  11/2020, MD  Internal Medicine Resident, PGY-1 [facility]  Pager: [phone] 2:19 PM, 10/05/2021

## 2021-10-05 NOTE — Progress Notes (Signed)
Occupational Therapy Treatment Patient Details Name: Calvin Jackson MRN: [id number] DOB: 02-02-1961 Today's Date: 10/05/2021   History of present illness Pt is a 60 yo male presenting with worsening L side weakness. MRI revealed 7 mm acute infarct involving the L anterior paramedian pons. 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   OT comments  Roderic is progressing well towards his acute goals. He was soiled due to incontinent bowel upon arrival. Pt bathed while sitting and standing EOB with set up and close min guard A. He tolerated standing for ~5 minutes while bathing rear peri area and LB, no physical assist needed, no LOB. Pt also with great sitting balance during upper body ADLs. He benefited from education to use the RUE during functional tasks, safe BUE placement for each sit<>stand with RUE, and to manage his RLE during functional ambulation. He continues to benefit from OT acutely. D/c recommendation remains appropriate.    Recommendations for follow up therapy are one component of a multi-disciplinary discharge planning process, led by the attending physician.  Recommendations may be updated based on patient status, additional functional criteria and insurance authorization.    Follow Up Recommendations  Acute inpatient rehab (3hours/day)    Assistance Recommended at Discharge Frequent or constant Supervision/Assistance  Equipment Recommendations  Other (comment)    Recommendations for Other Services Rehab consult    Precautions / Restrictions Precautions Precautions: Fall Precaution Comments: airborne/contact precs; bowel incontinence Restrictions Weight Bearing Restrictions: No       Mobility Bed Mobility Overal bed mobility: Needs Assistance Bed Mobility: Supine to Sit     Supine to sit: Min assist     General bed mobility comments: min A for trunk elevation and adjusting hips towards EOB    Transfers Overall transfer level: Needs  assistance Equipment used: Rolling walker (2 wheels) Transfers: Sit to/from Stand Sit to Stand: Min assist           General transfer comment: cues for hand placement and assist for balance up from EOB, then up from recliner     Balance Overall balance assessment: Needs assistance Sitting-balance support: Feet supported Sitting balance-Leahy Scale: Fair Sitting balance - Comments: able to sit EOB to bathe with good balance   Standing balance support: Single extremity supported;During functional activity Standing balance-Leahy Scale: Fair Standing balance comment: able to statically standing and wash peri area with fair balance                           ADL either performed or assessed with clinical judgement   ADL Overall ADL's : Needs assistance/impaired     Grooming: Set up;Sitting Grooming Details (indicate cue type and reason): pt set up for shaving at the end of the session Upper Body Bathing: Supervision/ safety;Sitting;Set up Upper Body Bathing Details (indicate cue type and reason): sponge bathed UB while sitting EOB, some vc's for use of RUE during task Lower Body Bathing: Set up;Min guard;Sit to/from stand Lower Body Bathing Details (indicate cue type and reason): close min guard for bathing peri area in standing Upper Body Dressing : Set up;Sitting Upper Body Dressing Details (indicate cue type and reason): cues/education on donning RUE into shirt first, and taking it out last     Toilet Transfer: Minimal assistance;Ambulation;Rolling walker (2 wheels) Toilet Transfer Details (indicate cue type and reason): assistance for verbal cues to pick up RLE instead of dragging it. No physical assist needed, close min  guard for safety         Functional mobility during ADLs: Minimal assistance;Rolling walker (2 wheels) General ADL Comments: Due to bowel incontinence, pt completed ADL session at EOB with set up and close min guard throughout. Verbal cues for  management of R extremeties. Min A to boost into staning 1x, otherwide no physical assist needed. cues for safe positioning     Vision   Vision Assessment?: No apparent visual deficits   Perception     Praxis      Cognition Arousal/Alertness: Awake/alert Behavior During Therapy: WFL for tasks assessed/performed Overall Cognitive Status: No family/caregiver present to determine baseline cognitive functioning                                 General Comments: cognition was [facility] for ADLs and functional mobility tasks completed this session - benefitted from constant cues for management of RLE during ambulation, otherwise pt just dragging R foot. Also required cues for hand placement during transfers with RW                General Comments VSS on RA, pt motivated to participate wtih therapy    Pertinent Vitals/ Pain       Pain Assessment: No/denies pain         Frequency  Min 2X/week        Progress Toward Goals  OT Goals(current goals can now be found in the care plan section)  Progress towards OT goals: Progressing toward goals  Acute Rehab OT Goals OT Goal Formulation: With patient Time For Goal Achievement: 10/14/21 Potential to Achieve Goals: Good ADL Goals Pt Will Perform Grooming: with modified independence;standing Pt Will Perform Lower Body Bathing: with supervision;sitting/lateral leans;sit to/from stand Pt Will Perform Lower Body Dressing: with supervision;sitting/lateral leans;sit to/from stand Pt Will Transfer to Toilet: with min guard assist;ambulating Pt Will Perform Toileting - Clothing Manipulation and hygiene: with supervision;sitting/lateral leans;sit to/from stand Additional ADL Goal #1: Pt will maintain upright balance sitting EOB for 5 mins to prepare for seated ADL's.  Plan Discharge plan remains appropriate          End of Session Equipment Utilized During Treatment: Gait belt;Rolling walker (2 wheels)  OT Visit Diagnosis:  Unsteadiness on feet (R26.81);Other abnormalities of gait and mobility (R26.89);Muscle weakness (generalized) (M62.81);Hemiplegia and hemiparesis Hemiplegia - Right/Left: Right Hemiplegia - dominant/non-dominant: Dominant Hemiplegia - caused by: Cerebral infarction                 Time: 6789-3810 OT Time Calculation (min): 37 min  Charges: OT General Charges $OT Visit: 1 Visit OT Treatments $Self Care/Home Management : 23-37 mins   Dannel Rafter A Ana Woodroof 10/05/2021, 12:43 PM

## 2021-10-06 LAB — GLUCOSE, CAPILLARY
Glucose-Capillary: 92 mg/dL (ref 70–99)
Glucose-Capillary: 136 mg/dL — ABNORMAL HIGH (ref 70–99)

## 2021-10-06 NOTE — TOC Transition Note (Signed)
Transition of Care [facility]) - CM/SW Discharge Note   Patient Details  Name: Calvin Jackson MRN: [id number] Date of Birth: September 22, 1961  [facility] CM/SW Contact:  Baldemar Lenis, LCSW Phone Number: 10/06/2021, 10:32 AM   Clinical Narrative:   Nurse to call report to [phone].  Transport scheduled for 3:00 PM.    Final next level of care: IP Rehab Facility Barriers to Discharge: Barriers Resolved   Patient Goals and CMS Choice Patient states their goals for this hospitalization and ongoing recovery are:: to get rehab and get better CMS Medicare.gov Compare Post Acute Care list provided to:: Patient Choice offered to / list presented to : Patient  Discharge Placement              Patient chooses bed at:  ([facility]) Patient to be transferred to facility by: [facility] Name of family member notified: Self Patient and family notified of of transfer: 10/06/21  Discharge Plan and Services     Post Acute Care Choice: IP Rehab                               Social Determinants of Health (SDOH) Interventions     Readmission Risk Interventions No flowsheet data found.

## 2021-10-06 NOTE — Progress Notes (Signed)
Inpatient Rehab Admissions Coordinator:   Pt. Discharging to [facility] today. I will sign off.   Megan Salon, MS, CCC-SLP Rehab Admissions Coordinator  [phone] (celll) [phone] (office)

## 2021-10-06 NOTE — Discharge Summary (Addendum)
Name: Calvin Jackson MRN: [id number] DOB: August 23, 1961 60 y.o. PCP: Marliss Coots, NP  Date of Admission: 09/28/2021  6:36 PM Date of Discharge: 10/06/2021 Attending Physician: Velna Ochs, MD  Discharge Diagnosis: 1.  Acute ischemia of left pons 2.  COVID-19 infection 3.  Cervical spine stenosis with myelopathy 4.  AKI 5.  Type 2 diabetes 6.  Esophagitis  Discharge Medications: Allergies as of 10/06/2021   No Known Allergies      Medication List     STOP taking these medications    hydrochlorothiazide 25 MG tablet Commonly known as: HYDRODIURIL       TAKE these medications    acetaminophen 500 MG tablet Commonly known as: TYLENOL Take 1,000 mg by mouth 3 (three) times daily as needed for mild pain.   amLODipine 10 MG tablet Commonly known as: NORVASC Take 10 mg by mouth daily.   aspirin 81 MG chewable tablet Chew 1 tablet (81 mg total) by mouth daily.   atorvastatin 40 MG tablet Commonly known as: LIPITOR Take 1 tablet (40 mg total) by mouth daily.   clopidogrel 75 MG tablet Commonly known as: PLAVIX Take 75 mg by mouth daily.   cyclobenzaprine 10 MG tablet Commonly known as: FLEXERIL Take 10 mg by mouth at bedtime as needed for muscle spasms.   famotidine 40 MG tablet Commonly known as: PEPCID Take 1 tablet (40 mg total) by mouth every evening.   Flomax 0.4 MG Caps capsule Generic drug: tamsulosin Take 0.4 mg by mouth at bedtime.   losartan 100 MG tablet Commonly known as: COZAAR Take 100 mg by mouth daily.   metFORMIN 500 MG tablet Commonly known as: GLUCOPHAGE Take 500 mg by mouth in the morning and at bedtime.   pantoprazole 40 MG tablet Commonly known as: PROTONIX Take 1 tablet (40 mg total) by mouth daily.               Durable Medical Equipment  (From admission, onward)           Start     Ordered   10/04/21 2043  For home use only DME Walker rolling  Once       Question Answer Comment  Walker: With 5 Inch  Wheels   Patient needs a walker to treat with the following condition CVA (cerebral vascular accident) [facility])      10/04/21 2042            Disposition and follow-up:   Mr.Shuayb Gausman was discharged from [facility] in Stable condition.  At the hospital follow up visit please address:  1.   Acute ischemia of left pons -Follow-up with neurology in 4 weeks -Please follow-up with PCP as soon as possible -Continue DAPT per neurology -Continue atorvastatin, losartan and amlodipine  Cervical spine stenosis with myelopathy -Outpatient follow-up with neurosurgery.  Per neurology, consider 6 months after acute stroke for elective surgical procedure.  Hypertension Blood pressure is well controlled with amlodipine and losartan.  Will hold HCTZ at this time.  Can consider restarting if appropriate.  Esophagitis CT abdomen/pelvis showed distal esophagitis with unknown etiology.  He is high risk for malignancy in the setting of long-term tobacco use.  Patient will need outpatient GI follow-up.  AKI -Repeat BMP at next visit  2.  Labs / imaging needed at time of follow-up: BMP  3.  Pending labs/ test needing follow-up: N/A  Follow-up Appointments:  Follow-up Information     [name]. Schedule an appointment  as soon as possible for a visit in 1 month(s).   Specialty: Neurology Why: stroke clinic Contact information: [address] [place] [address] [phone]                 Hospital Course by problem list: Acute ischemia of the left anterior paramedian pons Patient presenting with 1 day history of predominantly right lower extremity In addition to some upper extremity weakness.  On examination, he does have 4 out of 5 strength in his upper and lower extremities with evidence of left facial droop.  MRI brain with 7 mm acute infarct involving the left anterior paramedian pons, which correlates with  patient's symptoms. TEE (LVEF 50-60) without embolic suspicion. A1c 5.6 and lipid panel with LDL 70.  Neurology and stroke team was on board.  He was counseled on risk factor modifications, including smoking cessation and medication adherence.  He will continue to dual antiplatelets therapy after discharge and follow-up with neurology in 4 weeks.  Continue atorvastatin, losartan and amlodipine.  Patient will be transferred to [facility] for continued physical therapy.   2. Cervical Spine Stenosis w/ Myelopathy  Cervical MRI with severe spinal stenosis and cord compression involving C3-4 and C4-5.  Neurosurgery consulted, surgical plans deferred pending CVA evaluation/stabilization. Once stabilized, neurosurgery recommending OP follow up.  Per neurology, consider 6 months after acute stroke for elective surgical procedure.  3. COVID 19 Infection  Tested positive on 10/26. Pt is asymptomatic without SHOB, fever, chills, increased work of breathing, or N/V/D. He is satting well on RA. Inflammatory markers WNL.    4. Esophagitis  CT abdomen/pelvis with for distal esophagitis. Etiology unknown, will need OP work-up with potential EGD given pt is high risk for malignancy in the setting of long term tobacco use.  HPI Patient was seen at bedside.  He appears comfortable and in no acute distress.  He reports feeling well.  Denies any shortness of breath coughing or respiratory symptoms.  Discharge Exam:   BP 135/84 (BP Location: Left Arm)   Pulse (!) 55   Temp 98.7 F (37.1 C) (Oral)   Resp 14   SpO2 99%  Discharge exam:  Physical Exam Constitutional:      General: He is not in acute distress.    Appearance: He is not ill-appearing.  HENT:     Head: Normocephalic.  Cardiovascular:     Rate and Rhythm: Normal rate and regular rhythm.     Heart sounds: Normal heart sounds.  Pulmonary:     Effort: Pulmonary effort is normal. No respiratory distress.     Breath sounds: Normal breath sounds.   Abdominal:     Palpations: Abdomen is soft.     Tenderness: There is no abdominal tenderness.  Skin:    General: Skin is warm.  Neurological:     Mental Status: He is alert and oriented to person, place, and time.  Psychiatric:        Mood and Affect: Mood normal.        Behavior: Behavior normal.     Pertinent Labs, Studies, and Procedures:  CT HEAD WO CONTRAST  Result Date: 09/28/2021 CLINICAL DATA:  Neurological deficit. EXAM: CT HEAD WITHOUT CONTRAST TECHNIQUE: Contiguous axial images were obtained from the base of the skull through the vertex without intravenous contrast. COMPARISON:  October 15, 2020 FINDINGS: Brain: There is mild cerebral atrophy with widening of the extra-axial spaces and ventricular dilatation. There are areas of decreased attenuation within the  white matter tracts of the supratentorial brain, consistent with microvascular disease changes. Multiple bilateral basal ganglia lacunar infarcts are seen. An additional small chronic infarct is seen along the right frontal lobe and anterolateral aspect of the cerebellum on the right. Vascular: No hyperdense vessel or unexpected calcification. Skull: Normal. Negative for fracture or focal lesion. Sinuses/Orbits: Chronic thickening of the wall of the left maxillary sinus is seen. Other: None. IMPRESSION: 1. Generalized cerebral atrophy. 2. Multiple bilateral basal ganglia lacunar infarcts. 3. Additional small chronic infarct along the right frontal lobe and anterolateral aspect of the right cerebellum. 4. Chronic left maxillary sinus disease. Electronically Signed   By: Virgina Norfolk M.D.   On: 09/28/2021 19:05   MR ANGIO HEAD WO CONTRAST  Result Date: 09/29/2021 CLINICAL DATA:  Neuro deficit, acute stroke suspected EXAM: MRA HEAD WITHOUT CONTRAST TECHNIQUE: Angiographic images of the Circle of Willis were acquired using MRA technique without intravenous contrast. COMPARISON:  Same-day brain MRI, CT a head/neck 10/15/2020  FINDINGS: Anterior circulation: The intracranial ICAs are patent. The bilateral MCAs are patent. Previously described moderate stenosis of the right M1 segment seen on the CTA of 10/15/2020 is not seen on the current study. The bilateral ACAs are patent. There is no aneurysm. Previously described outpouchings arising from the ophthalmic segments of the ICAs likely reflected the origins of the ophthalmic arteries. Posterior circulation: The bilateral V4 segments are patent to the level imaged. The basilar artery is patent. The bilateral PCAs are patent with mild irregularity of the distal branches on the left. Anatomic variants: The bilateral posterior communicating arteries are not identified. The proximal right ACA is diminutive with the distal branches supplied by a prominent branch off the left A2 segment. Other: None. IMPRESSION: Patent intracranial vasculature with mild atherosclerotic irregularity of the distal left PCA branches. Previously described moderate stenosis of the right M1 segment is not appreciated on the current study. Electronically Signed   By: Valetta Mole M.D.   On: 09/29/2021 17:14   MR ANGIO NECK WO CONTRAST  Result Date: 09/29/2021 CLINICAL DATA:  Neuro deficit, acute stroke suspected EXAM: MRA NECK WITHOUT CONTRAST TECHNIQUE: Angiographic images of the neck were acquired using MRA technique without intravenous contrast. Carotid stenosis measurements (when applicable) are obtained utilizing NASCET criteria, using the distal internal carotid diameter as the denominator. COMPARISON:  CTA neck 10/15/2020 FINDINGS: Image quality is moderately degraded by motion artifact. Aortic arch: The aortic arch is not evaluated. Right carotid system: The right common, internal, and external carotid arteries are patent with antegrade flow. There is significant motion artifact in the region of the carotid bulb, limiting evaluation for dissection or stenosis. Left carotid system: The left common,  internal, and external carotid arteries are patent with antegrade flow. There is significant motion artifact in the region of the carotid bulb, limiting evaluation for dissection or stenosis. Vertebral arteries: The vertebral arteries are patent with antegrade flow. There is no definite stenosis or dissection. No aneurysm is seen. Other: None. IMPRESSION: Patent vasculature of the neck with no definite stenosis or dissection, within the confines of motion degraded images. Electronically Signed   By: Valetta Mole M.D.   On: 09/29/2021 17:32   MR BRAIN WO CONTRAST  Result Date: 09/29/2021 CLINICAL DATA:  Neuro deficit, acute, stroke suspected. Weakness and recurrent falling. EXAM: MRI HEAD WITHOUT CONTRAST MRI CERVICAL SPINE WITHOUT CONTRAST TECHNIQUE: Multiplanar, multiecho pulse sequences of the brain and surrounding structures, and cervical spine, to include the craniocervical junction and cervicothoracic junction, were  obtained without intravenous contrast. COMPARISON:  Head CT yesterday. FINDINGS: MRI HEAD FINDINGS Brain: 7 mm acute infarction in the left anterior para median pons. No other acute insult. Few old small vessel cerebellar infarctions. Cerebral hemispheres show advanced chronic ischemic changes with small vessel infarctions affecting the thalami I, basal ganglia and throughout the cerebral hemispheric white matter. There is an old right frontal cortical and subcortical infarction. No mass lesion, hemorrhage, hydrocephalus or extra-axial collection. Vascular: Major vessels at the base of the brain show flow. Skull and upper cervical spine: Negative Sinuses/Orbits: Inflammatory type changes of the left ethmoid sinuses and nasal passages. Question polyposis. Orbits negative. Other: None MRI CERVICAL SPINE FINDINGS Alignment: Straightening of the normal cervical lordosis. Vertebrae: No fracture or focal bone lesion. Cord: No primary cord lesion.  See below regarding stenosis. Posterior Fossa,  vertebral arteries, paraspinal tissues: See results of brain MRI. Soft tissues of the neck appear unremarkable. Disc levels: Foramen magnum is widely patent.  C1-2 is unremarkable. C2-3: Minimal disc bulge.  No stenosis. C3-4: Endplate osteophytes and bulging of the disc. Spinal stenosis with AP diameter of the canal only 5 mm. Cord flattening with abnormal T2 signal, worse on the left, consistent with compressive myelopathy. Moderate bilateral foraminal stenosis. C4-5: Endplate osteophytes and broad-based herniation of disc material with slight caudal down turning. Spinal stenosis with AP diameter in the of the canal in the midline only 3.7 mm. Flattening of spinal cord with mild abnormal T2 signal consistent with compressive myelopathy. Bilateral foraminal stenosis. C5-6: Endplate osteophytes and bulging of the disc more prominent towards the left. Spinal stenosis with AP diameter of the canal in the midline measuring 5.5 mm. Mild cord deformity. Bilateral foraminal stenosis. C6-7: Endplate osteophytes, bulging of the disc and a shallow left posterolateral disc herniation. AP diameter of the canal in the midline 8.2 mm. Mild flattening of the left side of the cord. Foraminal narrowing left more than right. C7-T1: Minimal disc bulge.  No compressive stenosis. IMPRESSION: Brain MRI: Acute 7 mm infarction in the left anterior para median pons. Extensive chronic ischemic changes elsewhere throughout the brain as outlined above. Cervical spine MRI: Severe cervical canal stenosis from C3-4 through C5-6. Cord flattening and deformity with abnormal T2 signal in the cord at C3-4 and C4-5, consistent with compressive myelopathy. C6-7 shows a left paracentral disc herniation with mild cord deformity on the left. Foraminal stenoses from C3-4 through C6-7 could cause neural compression as well. Electronically Signed   By: Nelson Chimes M.D.   On: 09/29/2021 09:44   MR Cervical Spine Wo Contrast  Result Date:  09/29/2021 CLINICAL DATA:  Neuro deficit, acute, stroke suspected. Weakness and recurrent falling. EXAM: MRI HEAD WITHOUT CONTRAST MRI CERVICAL SPINE WITHOUT CONTRAST TECHNIQUE: Multiplanar, multiecho pulse sequences of the brain and surrounding structures, and cervical spine, to include the craniocervical junction and cervicothoracic junction, were obtained without intravenous contrast. COMPARISON:  Head CT yesterday. FINDINGS: MRI HEAD FINDINGS Brain: 7 mm acute infarction in the left anterior para median pons. No other acute insult. Few old small vessel cerebellar infarctions. Cerebral hemispheres show advanced chronic ischemic changes with small vessel infarctions affecting the thalami I, basal ganglia and throughout the cerebral hemispheric white matter. There is an old right frontal cortical and subcortical infarction. No mass lesion, hemorrhage, hydrocephalus or extra-axial collection. Vascular: Major vessels at the base of the brain show flow. Skull and upper cervical spine: Negative Sinuses/Orbits: Inflammatory type changes of the left ethmoid sinuses and nasal passages. Question polyposis.  Orbits negative. Other: None MRI CERVICAL SPINE FINDINGS Alignment: Straightening of the normal cervical lordosis. Vertebrae: No fracture or focal bone lesion. Cord: No primary cord lesion.  See below regarding stenosis. Posterior Fossa, vertebral arteries, paraspinal tissues: See results of brain MRI. Soft tissues of the neck appear unremarkable. Disc levels: Foramen magnum is widely patent.  C1-2 is unremarkable. C2-3: Minimal disc bulge.  No stenosis. C3-4: Endplate osteophytes and bulging of the disc. Spinal stenosis with AP diameter of the canal only 5 mm. Cord flattening with abnormal T2 signal, worse on the left, consistent with compressive myelopathy. Moderate bilateral foraminal stenosis. C4-5: Endplate osteophytes and broad-based herniation of disc material with slight caudal down turning. Spinal stenosis with  AP diameter in the of the canal in the midline only 3.7 mm. Flattening of spinal cord with mild abnormal T2 signal consistent with compressive myelopathy. Bilateral foraminal stenosis. C5-6: Endplate osteophytes and bulging of the disc more prominent towards the left. Spinal stenosis with AP diameter of the canal in the midline measuring 5.5 mm. Mild cord deformity. Bilateral foraminal stenosis. C6-7: Endplate osteophytes, bulging of the disc and a shallow left posterolateral disc herniation. AP diameter of the canal in the midline 8.2 mm. Mild flattening of the left side of the cord. Foraminal narrowing left more than right. C7-T1: Minimal disc bulge.  No compressive stenosis. IMPRESSION: Brain MRI: Acute 7 mm infarction in the left anterior para median pons. Extensive chronic ischemic changes elsewhere throughout the brain as outlined above. Cervical spine MRI: Severe cervical canal stenosis from C3-4 through C5-6. Cord flattening and deformity with abnormal T2 signal in the cord at C3-4 and C4-5, consistent with compressive myelopathy. C6-7 shows a left paracentral disc herniation with mild cord deformity on the left. Foraminal stenoses from C3-4 through C6-7 could cause neural compression as well. Electronically Signed   By: Nelson Chimes M.D.   On: 09/29/2021 09:44   ECHOCARDIOGRAM COMPLETE  Result Date: 09/29/2021    ECHOCARDIOGRAM REPORT   Patient Name:   BREYER SNUFFER Date of Exam: 09/29/2021 Medical Rec #:  [id number]      Height:       70.0 in Accession #:    [id number]     Weight:       185.0 lb Date of Birth:  1961/01/14      BSA:          2.019 m Patient Age:    80 years       BP:           198/97 mmHg Patient Gender: M              HR:           57 bpm. Exam Location:  Inpatient Procedure: 2D Echo, Cardiac Doppler and Color Doppler Indications:    Stroke  History:        Patient has prior history of Echocardiogram examinations, most                 recent 10/15/2020. Risk Factors:Hypertension,  Current Smoker and                 Diabetes.  Sonographer:    Helmut Muster Referring Phys: [id number] [place]  1. Left ventricular ejection fraction, by estimation, is 55 to 60%. The left ventricle has normal function. The left ventricle has no regional wall motion abnormalities. Left ventricular diastolic parameters are consistent with Grade I diastolic dysfunction (impaired relaxation).  2. Right ventricular  systolic function is normal. The right ventricular size is normal. Tricuspid regurgitation signal is inadequate for assessing PA pressure.  3. The mitral valve is normal in structure. Trivial mitral valve regurgitation.  4. The aortic valve is tricuspid. Aortic valve regurgitation is trivial. No aortic stenosis is present.  5. The IVC was not visualized. FINDINGS  Left Ventricle: Left ventricular ejection fraction, by estimation, is 55 to 60%. The left ventricle has normal function. The left ventricle has no regional wall motion abnormalities. The left ventricular internal cavity size was normal in size. There is  no left ventricular hypertrophy. Left ventricular diastolic parameters are consistent with Grade I diastolic dysfunction (impaired relaxation). Right Ventricle: The right ventricular size is normal. No increase in right ventricular wall thickness. Right ventricular systolic function is normal. Tricuspid regurgitation signal is inadequate for assessing PA pressure. Left Atrium: Left atrial size was normal in size. Right Atrium: Right atrial size was normal in size. Pericardium: There is no evidence of pericardial effusion. Mitral Valve: The mitral valve is normal in structure. Trivial mitral valve regurgitation. Tricuspid Valve: The tricuspid valve is normal in structure. Tricuspid valve regurgitation is not demonstrated. Aortic Valve: The aortic valve is tricuspid. Aortic valve regurgitation is trivial. No aortic stenosis is present. Pulmonic Valve: The pulmonic valve was normal in  structure. Pulmonic valve regurgitation is trivial. Aorta: The aortic root is normal in size and structure. Venous: The inferior vena cava was not well visualized. IAS/Shunts: No atrial level shunt detected by color flow Doppler.  LEFT VENTRICLE PLAX 2D LVIDd:         4.20 cm      Diastology LVIDs:         3.00 cm      LV e' medial:    6.96 cm/s LV PW:         1.10 cm      LV E/e' medial:  9.6 LV IVS:        1.10 cm      LV e' lateral:   11.40 cm/s LVOT diam:     1.90 cm      LV E/e' lateral: 5.9 LV SV:         52 LV SV Index:   26 LVOT Area:     2.84 cm  LV Volumes (MOD) LV vol d, MOD A2C: 80.5 ml LV vol d, MOD A4C: 104.0 ml LV vol s, MOD A2C: 30.3 ml LV vol s, MOD A4C: 44.0 ml LV SV MOD A2C:     50.2 ml LV SV MOD A4C:     104.0 ml LV SV MOD BP:      57.2 ml RIGHT VENTRICLE RV S prime:     12.60 cm/s TAPSE (M-mode): 2.2 cm LEFT ATRIUM             Index        RIGHT ATRIUM           Index LA diam:        3.50 cm 1.73 cm/m   RA Area:     14.20 cm LA Vol (A2C):   51.7 ml 25.60 ml/m  RA Volume:   30.40 ml  15.05 ml/m LA Vol (A4C):   45.2 ml 22.38 ml/m LA Biplane Vol: 49.6 ml 24.56 ml/m  AORTIC VALVE             PULMONIC VALVE LVOT Vmax:   112.00 cm/s PR End Diast Vel: 2.31 msec LVOT Vmean:  72.700 cm/s LVOT VTI:  0.182 m  AORTA Ao Root diam: 3.20 cm Ao Asc diam:  3.50 cm MITRAL VALVE MV Area (PHT): 1.85 cm    SHUNTS MV Decel Time: 411 msec    Systemic VTI:  0.18 m MV E velocity: 66.90 cm/s  Systemic Diam: 1.90 cm MV A velocity: 59.20 cm/s MV E/A ratio:  1.13 Dalton McleanMD Electronically signed by Franki Monte Signature Date/Time: 09/29/2021/5:03:34 PM    Final    DG Hip Unilat W or Wo Pelvis 2-3 Views Right  Result Date: 09/29/2021 CLINICAL DATA:  Fall and right hip pain. EXAM: DG HIP (WITH OR WITHOUT PELVIS) 2-3V RIGHT COMPARISON:  None. FINDINGS: No acute fracture or dislocation. No significant arthritic changes. The soft tissues are unremarkable. IMPRESSION: Negative. Electronically Signed   By:  Anner Crete M.D.   On: 09/29/2021 02:49     CBC Latest Ref Rng & Units 10/03/2021 10/02/2021 10/01/2021  WBC 4.0 - 10.5 K/uL 4.4 4.2 6.0  Hemoglobin 13.0 - 17.0 g/dL 13.6 13.8 14.2  Hematocrit 39.0 - 52.0 % 39.2 39.9 39.0  Platelets 150 - 400 K/uL 190 189 161    CMP Latest Ref Rng & Units 10/04/2021 10/02/2021 10/01/2021  Glucose 70 - 99 mg/dL 75 123(H) 84  BUN 8 - 23 mg/dL 17 18 17   Creatinine 0.61 - 1.24 mg/dL 1.33(H) 1.50(H) 1.75(H)  Sodium 135 - 145 mmol/L 138 137 137  Potassium 3.5 - 5.1 mmol/L 4.0 4.0 3.8  Chloride 98 - 111 mmol/L 107 106 104  CO2 22 - 32 mmol/L 25 23 25   Calcium 8.9 - 10.3 mg/dL 8.2(L) 8.2(L) 8.1(L)  Total Protein 6.5 - 8.1 g/dL - - 6.0(L)  Total Bilirubin 0.3 - 1.2 mg/dL - - 0.8  Alkaline Phos 38 - 126 U/L - - 49  AST 15 - 41 U/L - - 19  ALT 0 - 44 U/L - - 14     Discharge Instructions: Discharge Instructions     Ambulatory referral to Neurology   Complete by: As directed    Follow up with stroke clinic NP (Jessica Vanschaick or Cecille Rubin, if both not available, consider Zachery Dauer, or [facility]) at [facility] in about 4 weeks. Thanks.   Call MD for:  difficulty breathing, headache or visual disturbances   Complete by: As directed    Call MD for:  severe uncontrolled pain   Complete by: As directed    Diet - low sodium heart healthy   Complete by: As directed    Discharge instructions   Complete by: As directed    Mr. Groening,  It was a pleasure taking care of you during this admission. 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 You were started on medications to prevent another stroke such as aspirin, plavix and atorvastatin.   You will be transferred to an inpatient rehab for physical therapy.  Take care,  Dr. Alfonse Spruce   Increase activity slowly   Complete by: As directed        Signed: Gaylan Gerold, DO 10/06/2021, 9:57 AM   Pager: [phone]

## 2021-10-06 NOTE — Plan of Care (Signed)
  Problem: Education: Goal: Knowledge of General Education information will improve Description: Including pain rating scale, medication(s)/side effects and non-pharmacologic comfort measures Outcome: Progressing   Problem: Health Behavior/Discharge Planning: Goal: Ability to manage health-related needs will improve Outcome: Progressing   Problem: Clinical Measurements: Goal: Ability to maintain clinical measurements within normal limits will improve Outcome: Progressing Goal: Will remain free from infection Outcome: Progressing Goal: Diagnostic test results will improve Outcome: Progressing Goal: Respiratory complications will improve Outcome: Progressing Goal: Cardiovascular complication will be avoided Outcome: Progressing   Problem: Activity: Goal: Risk for activity intolerance will decrease Outcome: Progressing   Problem: Nutrition: Goal: Adequate nutrition will be maintained Outcome: Progressing   Problem: Coping: Goal: Level of anxiety will decrease Outcome: Progressing   Problem: Elimination: Goal: Will not experience complications related to bowel motility Outcome: Progressing Goal: Will not experience complications related to urinary retention Outcome: Progressing   Problem: Pain Managment: Goal: General experience of comfort will improve Outcome: Progressing   Problem: Safety: Goal: Ability to remain free from injury will improve Outcome: Progressing   Problem: Skin Integrity: Goal: Risk for impaired skin integrity will decrease Outcome: Progressing   Problem: Education: Goal: Knowledge of disease or condition will improve Outcome: Progressing Goal: Knowledge of secondary prevention will improve (SELECT ALL) Outcome: Progressing Goal: Knowledge of patient specific risk factors will improve (INDIVIDUALIZE FOR PATIENT) Outcome: Progressing Goal: Individualized Educational Video(s) Outcome: Progressing   Problem: Coping: Goal: Will verbalize  positive feelings about self Outcome: Progressing Goal: Will identify appropriate support needs Outcome: Progressing   Problem: Health Behavior/Discharge Planning: Goal: Ability to manage health-related needs will improve Outcome: Progressing   Problem: Self-Care: Goal: Ability to participate in self-care as condition permits will improve Outcome: Progressing Goal: Verbalization of feelings and concerns over difficulty with self-care will improve Outcome: Progressing Goal: Ability to communicate needs accurately will improve Outcome: Progressing   Problem: Nutrition: Goal: Risk of aspiration will decrease Outcome: Progressing   Problem: Ischemic Stroke/TIA Tissue Perfusion: Goal: Complications of ischemic stroke/TIA will be minimized Outcome: Progressing   

## 2021-10-06 NOTE — Plan of Care (Signed)
Pt is alert oriented x 4. Pt has right sided weakness, denies numbness or tingling. Pt took medicine whole. Denies pain.   Problem: Education: Goal: Knowledge of General Education information will improve Description: Including pain rating scale, medication(s)/side effects and non-pharmacologic comfort measures Outcome: Progressing   Problem: Health Behavior/Discharge Planning: Goal: Ability to manage health-related needs will improve Outcome: Progressing   Problem: Clinical Measurements: Goal: Ability to maintain clinical measurements within normal limits will improve Outcome: Progressing Goal: Will remain free from infection Outcome: Progressing Goal: Diagnostic test results will improve Outcome: Progressing Goal: Respiratory complications will improve Outcome: Progressing Goal: Cardiovascular complication will be avoided Outcome: Progressing   Problem: Activity: Goal: Risk for activity intolerance will decrease Outcome: Progressing   Problem: Nutrition: Goal: Adequate nutrition will be maintained Outcome: Progressing   Problem: Coping: Goal: Level of anxiety will decrease Outcome: Progressing   Problem: Elimination: Goal: Will not experience complications related to bowel motility Outcome: Progressing Goal: Will not experience complications related to urinary retention Outcome: Progressing   Problem: Pain Managment: Goal: General experience of comfort will improve Outcome: Progressing   Problem: Safety: Goal: Ability to remain free from injury will improve Outcome: Progressing   Problem: Skin Integrity: Goal: Risk for impaired skin integrity will decrease Outcome: Progressing   Problem: Education: Goal: Knowledge of disease or condition will improve Outcome: Progressing Goal: Knowledge of secondary prevention will improve (SELECT ALL) Outcome: Progressing Goal: Knowledge of patient specific risk factors will improve (INDIVIDUALIZE FOR PATIENT) Outcome:  Progressing Goal: Individualized Educational Video(s) Outcome: Progressing   Problem: Coping: Goal: Will verbalize positive feelings about self Outcome: Progressing Goal: Will identify appropriate support needs Outcome: Progressing   Problem: Health Behavior/Discharge Planning: Goal: Ability to manage health-related needs will improve Outcome: Progressing   Problem: Self-Care: Goal: Ability to participate in self-care as condition permits will improve Outcome: Progressing Goal: Verbalization of feelings and concerns over difficulty with self-care will improve Outcome: Progressing Goal: Ability to communicate needs accurately will improve Outcome: Progressing   Problem: Nutrition: Goal: Risk of aspiration will decrease Outcome: Progressing   Problem: Ischemic Stroke/TIA Tissue Perfusion: Goal: Complications of ischemic stroke/TIA will be minimized Outcome: Progressing

## 2021-10-06 NOTE — Progress Notes (Addendum)
Report called to Auda RN at [facility]. All questions answered. IV and tele removed. Vitals WNL. Patient comfortable. Pt transported by [employer], belongings: personal walker, cell phone, charger, clothing, shoes.

## 2021-11-18 ENCOUNTER — Inpatient Hospital Stay: Payer: 59 | Admitting: Adult Health

## 2021-12-27 NOTE — Progress Notes (Deleted)
[name] [address] [place]. [place] [address] [phone]       HOSPITAL FOLLOW UP NOTE  [name] Date of Birth:  12-11-1960 Medical Record Number:  [id number]   Reason for Referral:  hospital stroke follow up    SUBJECTIVE:   CHIEF COMPLAINT:  No chief complaint on file.   HPI:   Calvin Jackson is a 61 year old male with history of diabetes, hypertension, hyperlipidemia, smoker who presented on 09/28/2021 with imbalance on walking, right leg weakness, confusion and several falls at home.  Personally reviewed hospitalization pertinent progress notes, lab work and imaging.  Evaluated by Dr. 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  MRA head/neck unremarkable.  EF 55 to 60%.  A1c 5.6.  LDL 70. COVID + -asymptomatic.  Hx of prior stroke 10/2020 - MRI showed right periventricular lacunar infarct, bilateral old subcortical infarcts and right parietal small cortical infarct. Recommended continue DAPT and atorvastatin 40 mg daily.  Tobacco cessation counseling provided.  MR cervical showed C3-C6 spinal stenosis with cord compression concerning for myelopathy.  Neurosurg consult -recommended 56-month follow-up after acute stroke for an elective surgical procedure.  Therapy eval's recommended CIR for ongoing therapy needs.       PERTINENT IMAGING/LABS  Per hospitalization 09/28/2021 CT head: 1. Generalized cerebral atrophy.2. Multiple bilateral basal ganglia lacunar infarcts.3. Additional small chronic infarct along the right frontal lobe and anterolateral aspect of the right cerebellum.4. Chronic left maxillary sinus disease  MRI Brain: Acute 7 mm infarction in the left anterior para median pons. Extensive chronic ischemic changes elsewhere throughout the Brain. MRA  head: Patent intracranial vasculature with mild atherosclerotic irregularity of the distal left PCA branches. Previously described moderate stenosis of the  right M1 segment is not appreciated on the current study.  MRA neck: Patent vasculature of the neck with no definite stenosis or dissection, within the confines of motion degraded images. 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  Tobacco Use   Smoking status: Every Day    Packs/day: 0.50    Years: 47.00    Pack years: 23.50    Types: Cigarettes   Smokeless tobacco: Never   Tobacco comments:    declines patch - reports vivid dreams  Substance and Sexual Activity   Alcohol use: Not Currently   Drug use: Yes    Types: Marijuana    Comment: occasional use   Sexual activity: Not on file  Other Topics Concern   Not on file  Social History Narrative   Not on file   Social Determinants of Health   Financial Resource Strain: Not on file  Food Insecurity: Not on file  Transportation Needs: Not on  file  Physical Activity: Not on file  Stress: Not on file  Social Connections: Not on file  Intimate Partner Violence: Not on file    Family History: No family history on file.  Medications:   Current Outpatient Medications on File Prior to Visit  Medication Sig Dispense Refill   acetaminophen (TYLENOL) 500 MG tablet Take 1,000 mg by mouth 3 (three) times daily as needed for mild pain.     amLODipine (NORVASC)  10 MG tablet Take 10 mg by mouth daily.     aspirin 81 MG chewable tablet Chew 1 tablet (81 mg total) by mouth daily.     atorvastatin (LIPITOR) 40 MG tablet Take 1 tablet (40 mg total) by mouth daily. 30 tablet 3   clopidogrel (PLAVIX) 75 MG tablet Take 75 mg by mouth daily.     cyclobenzaprine (FLEXERIL) 10 MG tablet Take 10 mg by mouth at bedtime as needed for muscle spasms.      famotidine (PEPCID) 40 MG tablet Take 1 tablet (40 mg total) by mouth every evening. 90 tablet 1   FLOMAX 0.4 MG CAPS capsule Take 0.4 mg by mouth at bedtime.     losartan (COZAAR) 100 MG tablet Take 100 mg by mouth daily.     metFORMIN (GLUCOPHAGE) 500 MG tablet Take 500 mg by mouth in the morning and at bedtime.     pantoprazole (PROTONIX) 40 MG tablet Take 1 tablet (40 mg total) by mouth daily. (Patient not taking: No sig reported) 30 tablet 0   No current facility-administered medications on file prior to visit.    Allergies:  No Known Allergies    OBJECTIVE:  Physical Exam  There were no vitals filed for this visit. There is no height or weight on file to calculate BMI. No results found.  No flowsheet data found.   General: well developed, well nourished, seated, in no evident distress Head: head normocephalic and atraumatic.   Neck: supple with no carotid or supraclavicular bruits Cardiovascular: regular rate and rhythm, no murmurs Musculoskeletal: no deformity Skin:  no rash/petichiae Vascular:  Normal pulses all extremities   Neurologic Exam Mental Status: Awake and fully alert. Oriented to place and time. Recent and remote memory intact. Attention span, concentration and fund of knowledge appropriate. Mood and affect appropriate.  Cranial Nerves: Fundoscopic exam reveals sharp disc margins. Pupils equal, briskly reactive to light. Extraocular movements full without nystagmus. Visual fields full to confrontation. Hearing intact. Facial sensation intact. Face, tongue, palate moves normally and  symmetrically.  Motor: Normal bulk and tone. Normal strength in all tested extremity muscles Sensory.: intact to touch , pinprick , position and vibratory sensation.  Coordination: Rapid alternating movements normal in all extremities. Finger-to-nose and heel-to-shin performed accurately bilaterally. Gait and Station: Arises from chair without difficulty. Stance is normal. Gait demonstrates normal stride length and balance with ***. Tandem walk and heel toe ***.  Reflexes: 1+ and symmetric. Toes downgoing.     NIHSS  *** Modified Rankin  ***      ASSESSMENT: Calvin Jackson is a 61 y.o. 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 Vascular risk factors include history of multiple prior strokes, HTN, HLD, DM, tobacco use and hx of substance abuse.      PLAN:  Left pontine stroke: Residual deficit: ***. Continue aspirin 81 mg daily and clopidogrel 75 mg daily  and atorvastatin 40 mg daily for secondary stroke prevention.  Discussed secondary stroke prevention measures  and importance of close PCP follow up for aggressive stroke risk factor management. I have gone over the pathophysiology of stroke, warning signs and symptoms, risk factors and their management in some detail with instructions to go to the closest emergency room for symptoms of concern. All medications managed by PCP - no refills will be provided through this office.  HTN: BP goal <130/90.  Stable on *** per PCP HLD: LDL goal <70. Recent LDL 78 on atorvastatin 40 mg daily managed by PCP.  DMII: A1c goal<7.0. Recent A1c 5.6 on metformin managed by PCP.     Follow up in *** or call earlier if needed   CC:  [facility] provider: Dr. Leonie Man PCP: Synthia Innocent Audrea Muscat, NP    I spent *** minutes of face-to-face and non-face-to-face time with patient.  This included previsit chart review including review of recent hospitalization, lab review, study review, order entry, electronic health  record documentation, patient education regarding recent stroke including etiology, secondary stroke prevention measures and importance of managing stroke risk factors, residual deficits and typical recovery time and answered all other questions to patient satisfaction   Frann Rider, AGNP-BC  [facility] [address] [place], [place] [address]  Phone [phone] Fax [phone] Note: This document was prepared with digital dictation and possible smart phrase technology. Any transcriptional errors that result from this process are unintentional.

## 2021-12-28 ENCOUNTER — Other Ambulatory Visit: Payer: Self-pay

## 2021-12-28 ENCOUNTER — Inpatient Hospital Stay: Payer: Self-pay | Admitting: Adult Health

## 2021-12-30 ENCOUNTER — Inpatient Hospital Stay: Payer: Medicaid Other | Admitting: Adult Health

## 2021-12-30 NOTE — Progress Notes (Deleted)
[name] [address] [place]. [place] [address] [phone]       HOSPITAL FOLLOW UP NOTE  [name] Date of Birth:  09/07/1961 Medical Record Number:  [id number]   Reason for Referral:  hospital stroke follow up    SUBJECTIVE:   CHIEF COMPLAINT:  No chief complaint on file.   HPI:   Calvin Jackson is a 61 year old male with history of diabetes, hypertension, hyperlipidemia, smoker who presented on 09/28/2021 with imbalance on walking, right leg weakness, confusion and several falls at home.  Personally reviewed hospitalization pertinent progress notes, lab work and imaging.  Evaluated by Dr. 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  MRA head/neck unremarkable.  EF 55 to 60%.  A1c 5.6.  LDL 70. COVID + -asymptomatic.  Hx of prior stroke 10/2020 - MRI showed right periventricular lacunar infarct, bilateral old subcortical infarcts and right parietal small cortical infarct. Recommended continue DAPT and atorvastatin 40 mg daily.  Tobacco cessation counseling provided.  MR cervical showed C3-C6 spinal stenosis with cord compression concerning for myelopathy.  Neurosurg consult -recommended 26-month follow-up after acute stroke for an elective surgical procedure.  Therapy eval's recommended CIR for ongoing therapy needs.       PERTINENT IMAGING/LABS  Per hospitalization 09/28/2021 CT head: 1. Generalized cerebral atrophy.2. Multiple bilateral basal ganglia lacunar infarcts.3. Additional small chronic infarct along the right frontal lobe and anterolateral aspect of the right cerebellum.4. Chronic left maxillary sinus disease  MRI Brain: Acute 7 mm infarction in the left anterior para median pons. Extensive chronic ischemic changes elsewhere throughout the Brain. MRA  head: Patent intracranial vasculature with mild atherosclerotic irregularity of the distal left PCA branches. Previously described moderate stenosis of the  right M1 segment is not appreciated on the current study.  MRA neck: Patent vasculature of the neck with no definite stenosis or dissection, within the confines of motion degraded images. 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  Tobacco Use   Smoking status: Every Day    Packs/day: 0.50    Years: 47.00    Pack years: 23.50    Types: Cigarettes   Smokeless tobacco: Never   Tobacco comments:    declines patch - reports vivid dreams  Substance and Sexual Activity   Alcohol use: Not Currently   Drug use: Yes    Types: Marijuana    Comment: occasional use   Sexual activity: Not on file  Other Topics Concern   Not on file  Social History Narrative   Not on file   Social Determinants of Health   Financial Resource Strain: Not on file  Food Insecurity: Not on file  Transportation Needs: Not on  file  Physical Activity: Not on file  Stress: Not on file  Social Connections: Not on file  Intimate Partner Violence: Not on file    Family History: No family history on file.  Medications:   Current Outpatient Medications on File Prior to Visit  Medication Sig Dispense Refill   acetaminophen (TYLENOL) 500 MG tablet Take 1,000 mg by mouth 3 (three) times daily as needed for mild pain.     amLODipine (NORVASC)  10 MG tablet Take 10 mg by mouth daily.     aspirin 81 MG chewable tablet Chew 1 tablet (81 mg total) by mouth daily.     atorvastatin (LIPITOR) 40 MG tablet Take 1 tablet (40 mg total) by mouth daily. 30 tablet 3   clopidogrel (PLAVIX) 75 MG tablet Take 75 mg by mouth daily.     cyclobenzaprine (FLEXERIL) 10 MG tablet Take 10 mg by mouth at bedtime as needed for muscle spasms.      famotidine (PEPCID) 40 MG tablet Take 1 tablet (40 mg total) by mouth every evening. 90 tablet 1   FLOMAX 0.4 MG CAPS capsule Take 0.4 mg by mouth at bedtime.     losartan (COZAAR) 100 MG tablet Take 100 mg by mouth daily.     metFORMIN (GLUCOPHAGE) 500 MG tablet Take 500 mg by mouth in the morning and at bedtime.     pantoprazole (PROTONIX) 40 MG tablet Take 1 tablet (40 mg total) by mouth daily. (Patient not taking: No sig reported) 30 tablet 0   No current facility-administered medications on file prior to visit.    Allergies:  No Known Allergies    OBJECTIVE:  Physical Exam  There were no vitals filed for this visit. There is no height or weight on file to calculate BMI. No results found.  No flowsheet data found.   General: well developed, well nourished, seated, in no evident distress Head: head normocephalic and atraumatic.   Neck: supple with no carotid or supraclavicular bruits Cardiovascular: regular rate and rhythm, no murmurs Musculoskeletal: no deformity Skin:  no rash/petichiae Vascular:  Normal pulses all extremities   Neurologic Exam Mental Status: Awake and fully alert. Oriented to place and time. Recent and remote memory intact. Attention span, concentration and fund of knowledge appropriate. Mood and affect appropriate.  Cranial Nerves: Fundoscopic exam reveals sharp disc margins. Pupils equal, briskly reactive to light. Extraocular movements full without nystagmus. Visual fields full to confrontation. Hearing intact. Facial sensation intact. Face, tongue, palate moves normally and  symmetrically.  Motor: Normal bulk and tone. Normal strength in all tested extremity muscles Sensory.: intact to touch , pinprick , position and vibratory sensation.  Coordination: Rapid alternating movements normal in all extremities. Finger-to-nose and heel-to-shin performed accurately bilaterally. Gait and Station: Arises from chair without difficulty. Stance is normal. Gait demonstrates normal stride length and balance with ***. Tandem walk and heel toe ***.  Reflexes: 1+ and symmetric. Toes downgoing.     NIHSS  *** Modified Rankin  ***      ASSESSMENT: Calvin Jackson is a 61 y.o. 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 Vascular risk factors include history of multiple prior strokes, HTN, HLD, DM, tobacco use and hx of substance abuse.      PLAN:  Left pontine stroke: Residual deficit: ***. Continue aspirin 81 mg daily and clopidogrel 75 mg daily  and atorvastatin 40 mg daily for secondary stroke prevention.  Discussed secondary stroke prevention measures  and importance of close PCP follow up for aggressive stroke risk factor management. I have gone over the pathophysiology of stroke, warning signs and symptoms, risk factors and their management in some detail with instructions to go to the closest emergency room for symptoms of concern. All medications managed by PCP - no refills will be provided through this office.  HTN: BP goal <130/90.  Stable on *** per PCP HLD: LDL goal <70. Recent LDL 78 on atorvastatin 40 mg daily managed by PCP.  DMII: A1c goal<7.0. Recent A1c 5.6 on metformin managed by PCP.     Follow up in *** or call earlier if needed   CC:  [facility] provider: Dr. Leonie Man PCP: Synthia Innocent Audrea Muscat, NP    I spent *** minutes of face-to-face and non-face-to-face time with patient.  This included previsit chart review including review of recent hospitalization, lab review, study review, order entry, electronic health  record documentation, patient education regarding recent stroke including etiology, secondary stroke prevention measures and importance of managing stroke risk factors, residual deficits and typical recovery time and answered all other questions to patient satisfaction   Frann Rider, AGNP-BC  [facility] [address] [place],  [address]  Phone [phone] Fax [phone] Note: This document was prepared with digital dictation and possible smart phrase technology. Any transcriptional errors that result from this process are unintentional.

## 2022-02-28 ENCOUNTER — Emergency Department (HOSPITAL_COMMUNITY)
Admission: EM | Admit: 2022-02-28 | Discharge: 2022-02-28 | Disposition: A | Discharge status: Home / Self Care | Payer: No Typology Code available for payment source | Attending: Emergency Medicine | Admitting: Emergency Medicine

## 2022-02-28 ENCOUNTER — Emergency Department (HOSPITAL_COMMUNITY): Payer: No Typology Code available for payment source

## 2022-02-28 ENCOUNTER — Encounter (HOSPITAL_COMMUNITY): Payer: Self-pay | Admitting: Emergency Medicine

## 2022-02-28 ENCOUNTER — Other Ambulatory Visit: Payer: Self-pay

## 2022-02-28 DIAGNOSIS — J441 Chronic obstructive pulmonary disease with (acute) exacerbation: Secondary | ICD-10-CM | POA: Diagnosis not present

## 2022-02-28 DIAGNOSIS — E119 Type 2 diabetes mellitus without complications: Secondary | ICD-10-CM | POA: Insufficient documentation

## 2022-02-28 DIAGNOSIS — Z20822 Contact with and (suspected) exposure to covid-19: Secondary | ICD-10-CM | POA: Insufficient documentation

## 2022-02-28 DIAGNOSIS — Z7982 Long term (current) use of aspirin: Secondary | ICD-10-CM | POA: Diagnosis not present

## 2022-02-28 DIAGNOSIS — Z7984 Long term (current) use of oral hypoglycemic drugs: Secondary | ICD-10-CM | POA: Insufficient documentation

## 2022-02-28 DIAGNOSIS — Z7901 Long term (current) use of anticoagulants: Secondary | ICD-10-CM | POA: Insufficient documentation

## 2022-02-28 DIAGNOSIS — I10 Essential (primary) hypertension: Secondary | ICD-10-CM | POA: Insufficient documentation

## 2022-02-28 DIAGNOSIS — Z79899 Other long term (current) drug therapy: Secondary | ICD-10-CM | POA: Diagnosis not present

## 2022-02-28 DIAGNOSIS — Z87891 Personal history of nicotine dependence: Secondary | ICD-10-CM | POA: Insufficient documentation

## 2022-02-28 DIAGNOSIS — R7989 Other specified abnormal findings of blood chemistry: Secondary | ICD-10-CM | POA: Insufficient documentation

## 2022-02-28 DIAGNOSIS — R0602 Shortness of breath: Secondary | ICD-10-CM | POA: Diagnosis present

## 2022-02-28 LAB — CBC WITH DIFFERENTIAL/PLATELET
Abs Immature Granulocytes: 0.01 10*3/uL (ref 0.00–0.07)
Basophils Absolute: 0 10*3/uL (ref 0.0–0.1)
Basophils Relative: 1 %
Eosinophils Absolute: 0.4 10*3/uL (ref 0.0–0.5)
Eosinophils Relative: 7 %
HCT: 47.1 % (ref 39.0–52.0)
Hemoglobin: 16.6 g/dL (ref 13.0–17.0)
Immature Granulocytes: 0 %
Lymphocytes Relative: 28 %
Lymphs Abs: 1.6 10*3/uL (ref 0.7–4.0)
MCH: 26.7 pg (ref 26.0–34.0)
MCHC: 35.2 g/dL (ref 30.0–36.0)
MCV: 75.8 fL — ABNORMAL LOW (ref 80.0–100.0)
Monocytes Absolute: 0.5 10*3/uL (ref 0.1–1.0)
Monocytes Relative: 9 %
Neutro Abs: 3.1 10*3/uL (ref 1.7–7.7)
Neutrophils Relative %: 55 %
Platelets: 204 10*3/uL (ref 150–400)
RBC: 6.21 MIL/uL — ABNORMAL HIGH (ref 4.22–5.81)
RDW: 13.9 % (ref 11.5–15.5)
WBC: 5.6 10*3/uL (ref 4.0–10.5)
nRBC: 0 % (ref 0.0–0.2)

## 2022-02-28 LAB — COMPREHENSIVE METABOLIC PANEL
ALT: 21 U/L (ref 0–44)
AST: 21 U/L (ref 15–41)
Albumin: 3.7 g/dL (ref 3.5–5.0)
Alkaline Phosphatase: 62 U/L (ref 38–126)
Anion gap: 7 (ref 5–15)
BUN: 16 mg/dL (ref 8–23)
CO2: 27 mmol/L (ref 22–32)
Calcium: 9.1 mg/dL (ref 8.9–10.3)
Chloride: 103 mmol/L (ref 98–111)
Creatinine, Ser: 1.18 mg/dL (ref 0.61–1.24)
GFR, Estimated: >60 mL/min (ref >60)
Glucose, Bld: 103 mg/dL — ABNORMAL HIGH (ref 70–99)
Potassium: 3.9 mmol/L (ref 3.5–5.1)
Sodium: 137 mmol/L (ref 135–145)
Total Bilirubin: 0.8 mg/dL (ref 0.3–1.2)
Total Protein: 7.4 g/dL (ref 6.5–8.1)

## 2022-02-28 LAB — BRAIN NATRIURETIC PEPTIDE: B Natriuretic Peptide: 113.1 pg/mL — ABNORMAL HIGH (ref 0.0–100.0)

## 2022-02-28 LAB — RESP PANEL BY RT-PCR (FLU A&B, COVID) ARPGX2
Influenza A by PCR: NEGATIVE
Influenza B by PCR: NEGATIVE
SARS Coronavirus 2 by RT PCR: NEGATIVE

## 2022-02-28 LAB — TROPONIN I (HIGH SENSITIVITY)
Troponin I (High Sensitivity): 18 ng/L — ABNORMAL HIGH (ref <18)
Troponin I (High Sensitivity): 15 ng/L (ref <18)

## 2022-02-28 IMAGING — DX DG CHEST 2V
2 series · 2 of 2 positions shown · non-contrast
Comparison: None.

CLINICAL DATA: Shortness of breath.

EXAM:
CHEST - 2 VIEW

[chest pa]
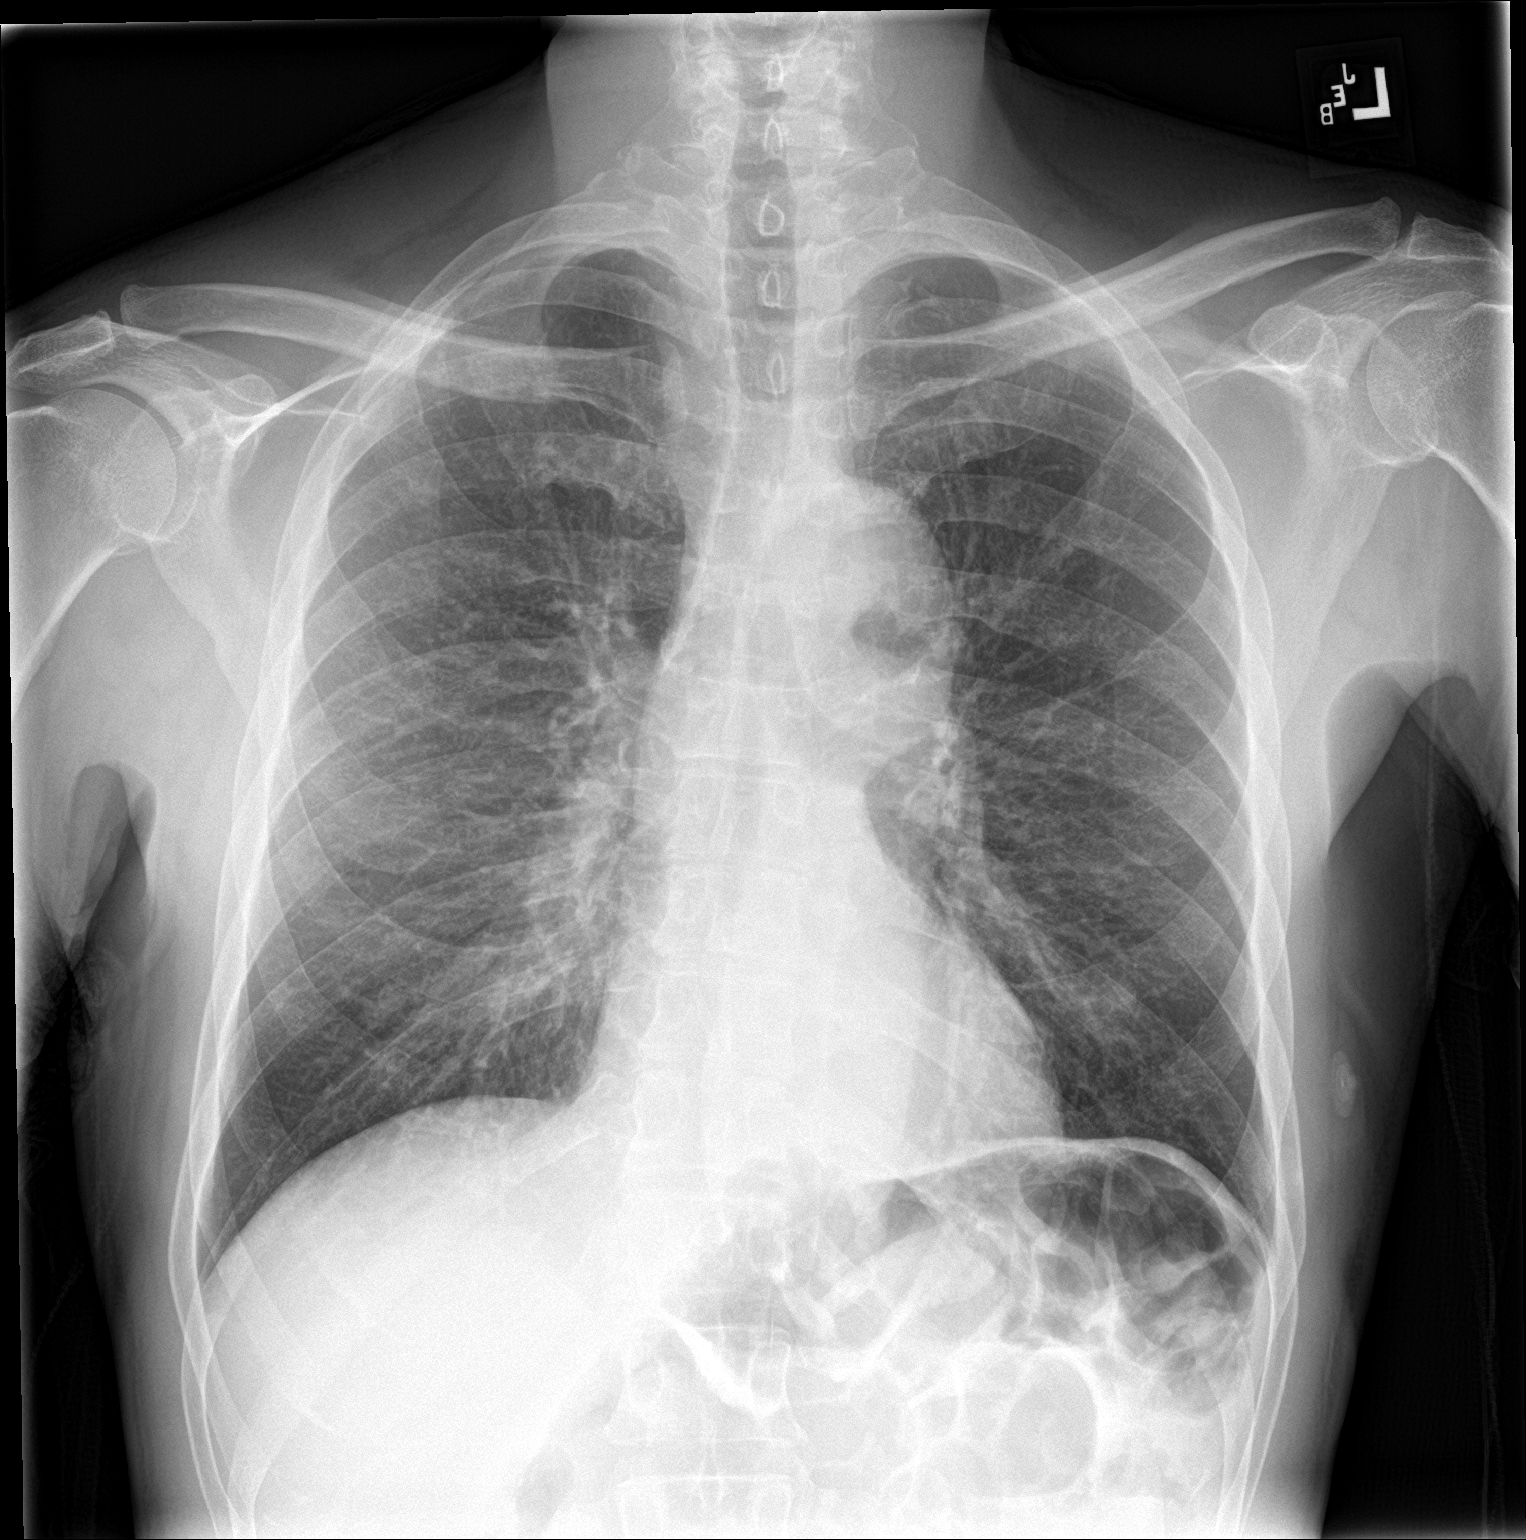

[chest lat]
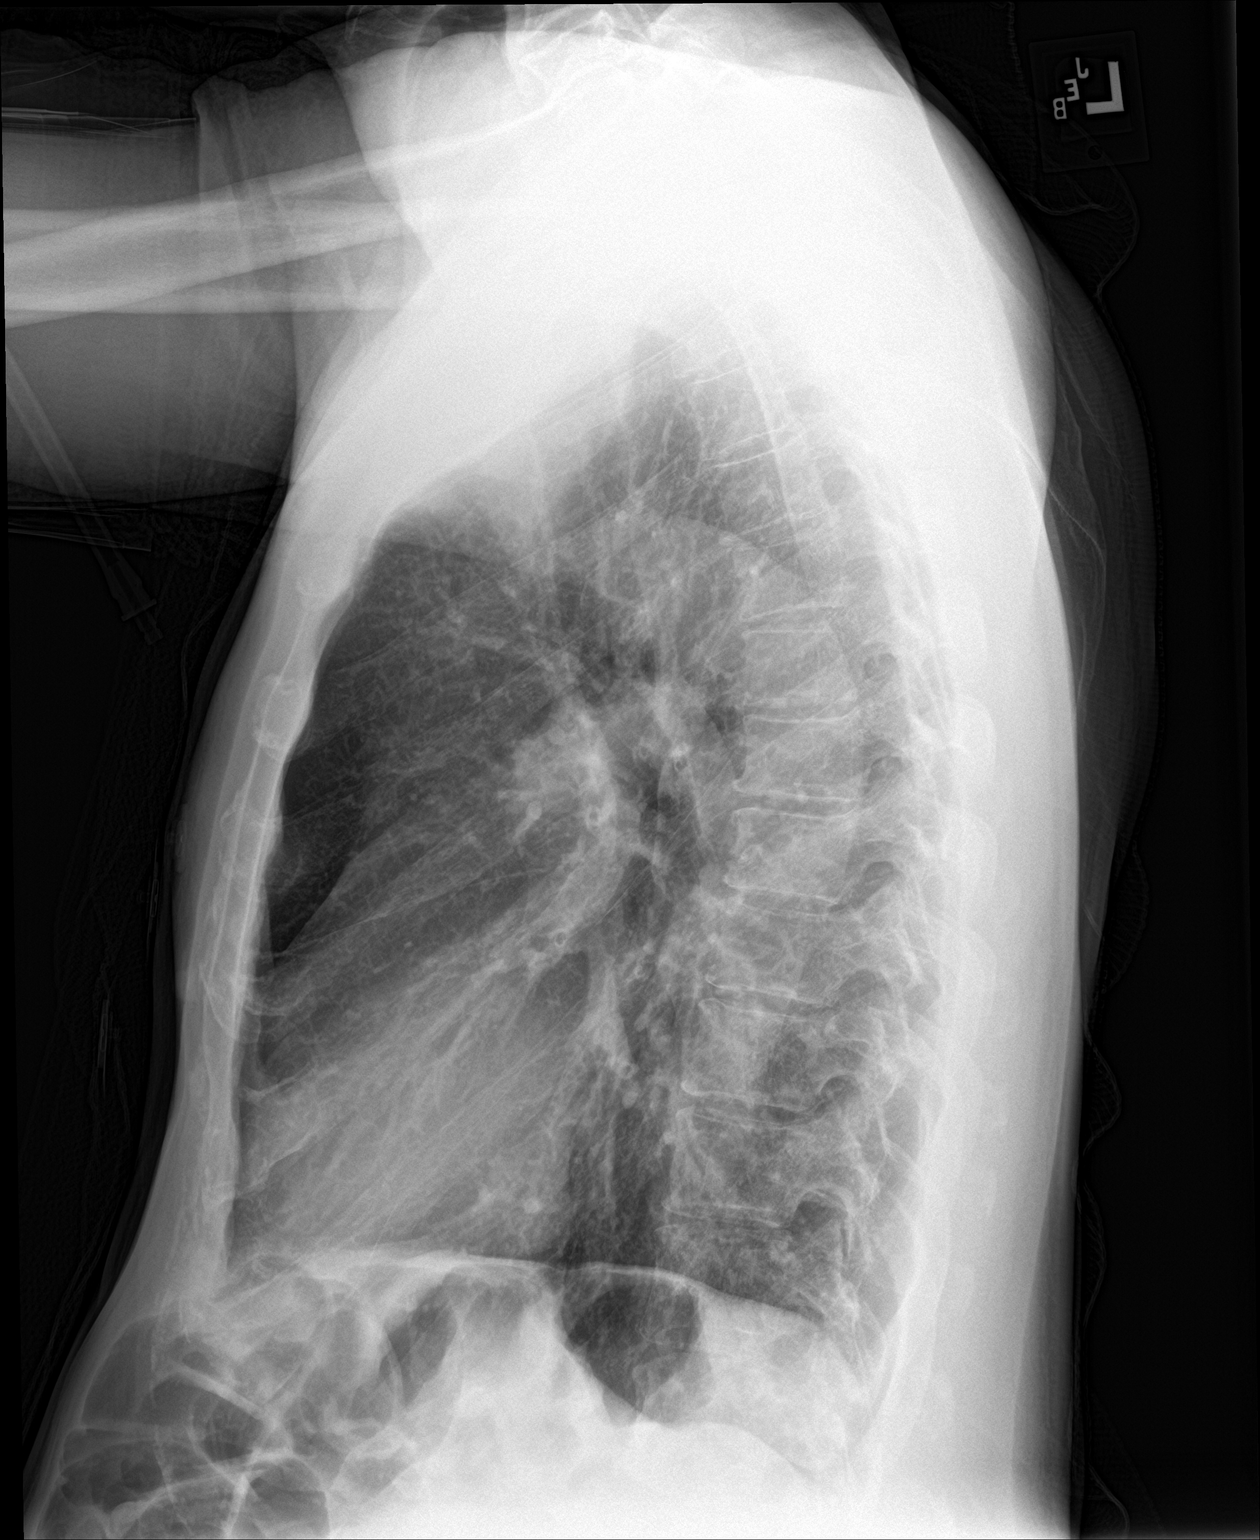

[2 of 2 positions shown; findings below may reference images not displayed]

FINDINGS: The heart size and mediastinal contours are within normal limits.
Both lungs are clear. The visualized skeletal structures are
unremarkable.
IMPRESSION: No active cardiopulmonary disease.

## 2022-02-28 MED ORDER — METHYLPREDNISOLONE SODIUM SUCC 125 MG IJ SOLR
125.0000 mg | Freq: Once | INTRAMUSCULAR | Status: AC
  Administered 2022-02-28: 125 mg via INTRAVENOUS
  Filled 2022-02-28: qty 2

## 2022-02-28 MED ORDER — MAGNESIUM SULFATE 2 GM/50ML IV SOLN
2.0000 g | Freq: Once | INTRAVENOUS | Status: AC
  Administered 2022-02-28: 2 g via INTRAVENOUS
  Filled 2022-02-28: qty 50

## 2022-02-28 MED ORDER — ALBUTEROL SULFATE HFA 108 (90 BASE) MCG/ACT IN AERS: 2.0000 | INHALATION_SPRAY | Freq: Once | RESPIRATORY_TRACT | Status: DC

## 2022-02-28 MED ORDER — AMLODIPINE BESYLATE 5 MG PO TABS
10.0000 mg | ORAL_TABLET | Freq: Once | ORAL | Status: AC
  Administered 2022-02-28: 10 mg via ORAL
  Filled 2022-02-28: qty 2

## 2022-02-28 MED ORDER — LOSARTAN POTASSIUM 50 MG PO TABS
100.0000 mg | ORAL_TABLET | Freq: Once | ORAL | Status: AC
  Administered 2022-02-28: 100 mg via ORAL
  Filled 2022-02-28: qty 2

## 2022-02-28 MED ORDER — BENZONATATE 100 MG PO CAPS: 100.0000 mg | ORAL_CAPSULE | Freq: Three times a day (TID) | ORAL | 0 refills | Status: DC

## 2022-02-28 MED ORDER — ALBUTEROL SULFATE (2.5 MG/3ML) 0.083% IN NEBU
10.0000 mg/h | INHALATION_SOLUTION | Freq: Once | RESPIRATORY_TRACT | Status: AC
  Administered 2022-02-28: 10 mg/h via RESPIRATORY_TRACT
  Filled 2022-02-28: qty 12

## 2022-02-28 MED ORDER — ALBUTEROL SULFATE HFA 108 (90 BASE) MCG/ACT IN AERS: 1.0000 | INHALATION_SPRAY | Freq: Four times a day (QID) | RESPIRATORY_TRACT | 1 refills | Status: AC | PRN

## 2022-02-28 NOTE — ED Triage Notes (Signed)
Patient coming from home, complaint of shortness of breath and cough at night when he tries to go to sleep, better during the day when up. ?

## 2022-02-28 NOTE — ED Provider Triage Note (Signed)
Emergency Medicine Provider Triage Evaluation Note ? ?Calvin Jackson , a 61 y.o. male  was evaluated in triage.  Pt complains of cough and orthopnea.  Patient reports that he has had dry cough and orthopnea over the last 4 days.  States that he is having difficulty sleeping due to the symptoms.  Patient also endorses paroxysmal nocturnal dyspnea. ? ?Patient denies any chest pain, palpitations, leg swelling or tenderness, lightheadedness, syncope, hemoptysis. ? ?Review of Systems  ?Positive: Nonproductive cough, orthopnea, paroxysmal nocturnal dyspnea ?Negative: See above ? ?Physical Exam  ?BP (!) 163/117   Pulse 60   Temp 98 ?F (36.7 ?C) (Oral)   Resp 19   SpO2 94%  ?Gen:   Awake, no distress   ?Resp:  Normal effort; inspiratory expiratory wheezing noted to all lung fields ?MSK:   Moves extremities without difficulty; no swelling or tenderness to bilateral lower extremities ?Other:  +2 radial pulse bilaterally ? ?Medical Decision Making  ?Medically screening exam initiated at 12:49 PM.  Appropriate orders placed.  Calvin Jackson was informed that the remainder of the evaluation will be completed by another provider, this initial triage assessment does not replace that evaluation, and the importance of remaining in the ED until their evaluation is complete. ? ?Last echo performed in 2022 showed EF 55 to 60%.  No history of CHF; however with reports of orthopnea and paroxysmal nocturnal dyspnea will obtain BNP, troponin, EKG, and chest x-ray. ?  ?Haskel Schroeder, PA-C ?02/28/22 1250 ? ?

## 2022-02-28 NOTE — ED Notes (Signed)
Notified Prosperi PA, pt BP is still elevated. Per PA, okay to dc patient.  ?

## 2022-02-28 NOTE — ED Provider Notes (Signed)
?  Face-to-face evaluation ? ? ?History: Patient presents for evaluation of cough and trouble breathing.  He is a smoker, stopped 4 weeks ago because his boss suggested to him. ? ?Physical exam: Slender, cooperative.  No respiratory distress.  Decreased air movement with scattered rhonchi and uses.  Extremities, no edema. ? ?MDM: Evaluation for  ?Chief Complaint  ?Patient presents with  ? Cough  ? Shortness of Breath  ?  ? ?Patient with signs symptoms of COPD exacerbation, without evident acute cardiopulmonary disorder.  No respiratory distress. ? ?Medical screening examination/treatment/procedure(s) were conducted as a shared visit with non-physician practitioner(s) and myself.  I personally evaluated the patient during the encounter ? ?  ?Mancel Bale, MD ?02/28/22 1800 ? ?

## 2022-02-28 NOTE — ED Provider Notes (Signed)
?[facility] EMERGENCY DEPARTMENT ?Provider Note ? ? ?CSN: [id number] ?Arrival date & time: 02/28/22  1230 ? ?  ? ?History ? ?Chief Complaint  ?Patient presents with  ? Cough  ? Shortness of Breath  ? ? ?Joshual Terrio is a 61 y.o. male with past medical history significant for marijuana use, previous tobacco dependence patient reports at least 10-year pack year smoking history, previous hypertension, diabetes, hyperlipidemia, and stroke who presents with concern for worsening shortness of breath, cough at night when he tries to go to sleep.  He endorses some orthopnea, PND.  He denies previous history of heart failure, asthma, COPD.  He denies any sputum, fever, chills.  He denies any nausea, vomiting.  He denies any chest pain. ? ? ?Cough ?Associated symptoms: shortness of breath and wheezing   ?Shortness of Breath ?Associated symptoms: cough and wheezing   ? ?  ? ?Home Medications ?Prior to Admission medications   ?Medication Sig Start Date End Date Taking? Authorizing Provider  ?acetaminophen (TYLENOL) 500 MG tablet Take 1,000 mg by mouth 3 (three) times daily as needed for mild pain.    [provider]  ?amLODipine (NORVASC) 10 MG tablet Take 10 mg by mouth daily.    [provider]  ?aspirin 81 MG chewable tablet Chew 1 tablet (81 mg total) by mouth daily. 10/18/20   Lewie Chamber, MD  ?atorvastatin (LIPITOR) 40 MG tablet Take 1 tablet (40 mg total) by mouth daily. 10/18/20   Lewie Chamber, MD  ?clopidogrel (PLAVIX) 75 MG tablet Take 75 mg by mouth daily.    [provider]  ?cyclobenzaprine (FLEXERIL) 10 MG tablet Take 10 mg by mouth at bedtime as needed for muscle spasms.  08/31/20   [provider]  ?famotidine (PEPCID) 40 MG tablet Take 1 tablet (40 mg total) by mouth every evening. 10/18/20 11/13/21  Lewie Chamber, MD  ?[facility] 0.4 MG CAPS capsule Take 0.4 mg by mouth at bedtime. 08/31/20   [provider]  ?losartan (COZAAR) 100 MG tablet Take  100 mg by mouth daily. 08/31/20   [provider]  ?metFORMIN (GLUCOPHAGE) 500 MG tablet Take 500 mg by mouth in the morning and at bedtime. 08/31/20   [provider]  ?pantoprazole (PROTONIX) 40 MG tablet Take 1 tablet (40 mg total) by mouth daily. ?Patient not taking: No sig reported 09/24/21   Eber Hong, MD  ?   ? ?Allergies    ?Patient has no known allergies.   ? ?Review of Systems   ?Review of Systems  ?Respiratory:  Positive for cough, shortness of breath and wheezing.   ?All other systems reviewed and are negative. ? ?Physical Exam ?Updated Vital Signs ?BP (!) 163/117   Pulse 60   Temp 98 ?F (36.7 ?C) (Oral)   Resp 19   SpO2 94%  ?Physical Exam ?Vitals and nursing note reviewed.  ?Constitutional:   ?   General: He is not in acute distress. ?   Appearance: Normal appearance. He is ill-appearing.  ?HENT:  ?   Head: Normocephalic and atraumatic.  ?Eyes:  ?   General:     ?   Right eye: No discharge.     ?   Left eye: No discharge.  ?Cardiovascular:  ?   Rate and Rhythm: Normal rate and regular rhythm.  ?   Heart sounds: No murmur heard. ?  No friction rub. No gallop.  ?Pulmonary:  ?   Effort: Pulmonary effort is normal.  ?  Breath sounds: Normal breath sounds.  ?   Comments: Significant inspiratory, expiratory wheezing, poor respiratory effort bilaterally ?Abdominal:  ?   General: Bowel sounds are normal.  ?   Palpations: Abdomen is soft.  ?Skin: ?   General: Skin is warm and dry.  ?   Capillary Refill: Capillary refill takes less than 2 seconds.  ?   Nails: There is clubbing.  ?Neurological:  ?   Mental Status: He is alert and oriented to person, place, and time.  ?Psychiatric:     ?   Mood and Affect: Mood normal.     ?   Behavior: Behavior normal.  ? ? ?ED Results / Procedures / Treatments   ?Labs ?(all labs ordered are listed, but only abnormal results are displayed) ?Labs Reviewed  ?CBC WITH DIFFERENTIAL/PLATELET - Abnormal; Notable for the following components:  ?    Result Value   ? RBC 6.21 (*)   ? MCV 75.8 (*)   ? All other components within normal limits  ?RESP PANEL BY RT-PCR (FLU A&B, COVID) ARPGX2  ?BRAIN NATRIURETIC PEPTIDE  ?COMPREHENSIVE METABOLIC PANEL  ?TROPONIN I (HIGH SENSITIVITY)  ? ? ?EKG ?None ? ?Radiology ?No results found. ? ?Procedures ?Procedures  ? ? ?Medications Ordered in ED ?Medications  ?albuterol (VENTOLIN HFA) 108 (90 Base) MCG/ACT inhaler 2 puff (has no administration in time range)  ? ? ?ED Course/ Medical Decision Making/ A&P ?  ?                        ?Medical Decision Making ?Risk ?Prescription drug management. ? ? ?I discussed this case with my attending physician who cosigned this note including patient's presenting symptoms, physical exam, and planned diagnostics and interventions. Attending physician stated agreement with plan or made changes to plan which were implemented.  ? ?Attending physician assessed patient at bedside. ? ?This patient presents to the ED for concern of shortness of breath, dry cough, orthopnea, PND, this involves an extensive number of treatment options, and is a complaint that carries with it a high risk of complications and morbidity. The emergent differential diagnosis prior to evaluation includes, but is not limited to, COPD exacerbation, heart failure, atypical ACS, other acute intrathoracic abnormality versus other.  ? ?This is not an exhaustive differential.  ? ?Past Medical History / Co-morbidities / Social History: ?History of stroke, diabetes, tobacco abuse, hypertension, medication noncompliance ? ?Additional history: ?External records from outside source obtained and reviewed including outpatient PCP visits, recent hospitalizations. ? ?Physical Exam: ?Physical exam performed. The pertinent findings include: Patient with significant bilateral wheezing, crackles That did not clear with cough.  No significant tachypnea, accessory muscle use.  No focal consolidation noted. ? ?Lab Tests: ?I ordered, and personally interpreted  labs.  The pertinent results include: BNP very mildly elevated at 113, CMP unremarkable, CBC overall unremarkable, slight microcytic quality of the red blood cells suggestive of possible iron deficiency.  RVP is negative.  Troponin initially 18, delta troponin at 15.  Patient with no chest pain throughout his examination. ?  ?Imaging Studies: ?I ordered imaging studies including film radiograph of the chest. I independently visualized and interpreted imaging which showed no acute intrathoracic abnormality. I agree with the radiologist interpretation. ?  ?Cardiac Monitoring:  ?The patient was maintained on a cardiac monitor.  My attending physician Dr. Effie ShyWentz viewed and interpreted the cardiac monitored which showed an underlying rhythm of: Normal sinus rhythm with some evidence of right atrial enlargement ?  ?  Medications: ?I ordered medication including amlodipine, losartan for hypertension, magnesium, albuterol continuous nebulization, and Solu-Medrol for COPD exacerbation, wheezing. Reevaluation of the patient after these medicines showed that the patient improved. I have reviewed the patients home medicines and have made adjustments as needed.  Patient still having some active wheezing, however he reports feeling significantly better.  He maintains stable oxygen saturation, does not become significantly tachycardic on ambulation. ? ?Disposition: ?After consideration of the diagnostic results and the patients response to treatment, I feel that consideration must be given for possible admission for COPD exacerbation the severe with no previous diagnosis.  However patient with stable oxygen saturation, no persistent tachycardia, no signs of fluid overload, or pneumonia on chest x-ray.  He requests discharge versus hospital observation at this time.  Feel this is reasonable.  He has negative troponin x2, overall unremarkable lab work.  Cleda Clarks needs close follow-up with his PCP, formal diagnosis of COPD with pulmonology.   In the meantime we will prescribe Tessalon, and albuterol to help with cough, and wheezing.  Strict return precautions given, patient discharged in stable condition at this time.  ? ?Portions of this r

## 2022-02-28 NOTE — Discharge Instructions (Signed)
As we discussed your presentation today was consistent with a COPD exacerbation, I am glad that you are feeling better.  I recommend following up with your PCP as soon as you can to discuss chronic treatment for this condition, and formal diagnosis.  If you have worsening breathing despite treatment please return to the emergency department for further evaluation.  You can use the [employer] that I prescribed for coughing, and the albuterol inhaler as needed for shortness of breath.  Take a large deep breath and with the inhaler, hold in your lungs, and then exhale.  Ask your pharmacist if you have any questions about proper usage. ?

## 2022-03-24 ENCOUNTER — Other Ambulatory Visit: Payer: Self-pay

## 2022-03-24 ENCOUNTER — Emergency Department (HOSPITAL_COMMUNITY)
Admission: EM | Admit: 2022-03-24 | Discharge: 2022-03-24 | Disposition: A | Discharge status: Home / Self Care | Payer: No Typology Code available for payment source | Attending: Emergency Medicine | Admitting: Emergency Medicine

## 2022-03-24 ENCOUNTER — Encounter (HOSPITAL_COMMUNITY): Payer: Self-pay | Admitting: Emergency Medicine

## 2022-03-24 DIAGNOSIS — Z7982 Long term (current) use of aspirin: Secondary | ICD-10-CM | POA: Insufficient documentation

## 2022-03-24 DIAGNOSIS — W208XXA Other cause of strike by thrown, projected or falling object, initial encounter: Secondary | ICD-10-CM | POA: Insufficient documentation

## 2022-03-24 DIAGNOSIS — S0502XA Injury of conjunctiva and corneal abrasion without foreign body, left eye, initial encounter: Secondary | ICD-10-CM | POA: Insufficient documentation

## 2022-03-24 DIAGNOSIS — S0592XA Unspecified injury of left eye and orbit, initial encounter: Secondary | ICD-10-CM | POA: Diagnosis present

## 2022-03-24 MED ORDER — OXYCODONE HCL 5 MG PO TABS
10.0000 mg | ORAL_TABLET | Freq: Once | ORAL | Status: AC
  Administered 2022-03-24: 10 mg via ORAL
  Filled 2022-03-24: qty 2

## 2022-03-24 MED ORDER — MORPHINE SULFATE 15 MG PO TABS: 7.5000 mg | ORAL_TABLET | ORAL | 0 refills | Status: DC | PRN

## 2022-03-24 MED ORDER — ERYTHROMYCIN 5 MG/GM OP OINT: TOPICAL_OINTMENT | OPHTHALMIC | 0 refills | Status: DC

## 2022-03-24 MED ORDER — ACETAMINOPHEN 500 MG PO TABS
1000.0000 mg | ORAL_TABLET | Freq: Once | ORAL | Status: AC
  Administered 2022-03-24: 1000 mg via ORAL
  Filled 2022-03-24: qty 2

## 2022-03-24 MED ORDER — FLUORESCEIN SODIUM 1 MG OP STRP
1.0000 | ORAL_STRIP | Freq: Once | OPHTHALMIC | Status: AC
Start: 2022-03-24 — End: 2022-03-24
  Administered 2022-03-24: 1 via OPHTHALMIC
  Filled 2022-03-24: qty 1

## 2022-03-24 MED ORDER — TETRACAINE HCL 0.5 % OP SOLN
2.0000 [drp] | Freq: Once | OPHTHALMIC | Status: AC
  Administered 2022-03-24: 2 [drp] via OPHTHALMIC
  Filled 2022-03-24: qty 4

## 2022-03-24 MED ORDER — ERYTHROMYCIN 5 MG/GM OP OINT
1.0000 "application " | TOPICAL_OINTMENT | Freq: Once | OPHTHALMIC | Status: AC
  Administered 2022-03-24: 1 via OPHTHALMIC
  Filled 2022-03-24: qty 3.5
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
## 2022-03-24 NOTE — Discharge Instructions (Addendum)
You have a break to the outer layer of the surface of the eye.  Please follow-up with the eye doctor in the office.  Use the antibiotics as prescribed. ?
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
## 2022-07-03 ENCOUNTER — Emergency Department (HOSPITAL_COMMUNITY)
Admission: EM | Admit: 2022-07-03 | Discharge: 2022-07-03 | Disposition: A | Discharge status: Home / Self Care | Payer: No Typology Code available for payment source | Attending: Emergency Medicine | Admitting: Emergency Medicine

## 2022-07-03 ENCOUNTER — Other Ambulatory Visit: Payer: Self-pay

## 2022-07-03 ENCOUNTER — Encounter (HOSPITAL_COMMUNITY): Payer: Self-pay | Admitting: Emergency Medicine

## 2022-07-03 ENCOUNTER — Emergency Department (HOSPITAL_COMMUNITY): Payer: No Typology Code available for payment source

## 2022-07-03 DIAGNOSIS — Z20822 Contact with and (suspected) exposure to covid-19: Secondary | ICD-10-CM | POA: Insufficient documentation

## 2022-07-03 DIAGNOSIS — Z7951 Long term (current) use of inhaled steroids: Secondary | ICD-10-CM | POA: Diagnosis not present

## 2022-07-03 DIAGNOSIS — Z79899 Other long term (current) drug therapy: Secondary | ICD-10-CM | POA: Insufficient documentation

## 2022-07-03 DIAGNOSIS — J441 Chronic obstructive pulmonary disease with (acute) exacerbation: Secondary | ICD-10-CM | POA: Insufficient documentation

## 2022-07-03 DIAGNOSIS — Z7982 Long term (current) use of aspirin: Secondary | ICD-10-CM | POA: Diagnosis not present

## 2022-07-03 DIAGNOSIS — Z7984 Long term (current) use of oral hypoglycemic drugs: Secondary | ICD-10-CM | POA: Insufficient documentation

## 2022-07-03 DIAGNOSIS — E119 Type 2 diabetes mellitus without complications: Secondary | ICD-10-CM | POA: Diagnosis not present

## 2022-07-03 DIAGNOSIS — Z87891 Personal history of nicotine dependence: Secondary | ICD-10-CM | POA: Insufficient documentation

## 2022-07-03 DIAGNOSIS — I10 Essential (primary) hypertension: Secondary | ICD-10-CM | POA: Diagnosis not present

## 2022-07-03 DIAGNOSIS — R609 Edema, unspecified: Secondary | ICD-10-CM

## 2022-07-03 DIAGNOSIS — R600 Localized edema: Secondary | ICD-10-CM | POA: Diagnosis not present

## 2022-07-03 DIAGNOSIS — R0603 Acute respiratory distress: Secondary | ICD-10-CM | POA: Diagnosis present

## 2022-07-03 LAB — I-STAT CHEM 8, ED
BUN: 28 mg/dL — ABNORMAL HIGH (ref 8–23)
Calcium, Ion: 1 mmol/L — ABNORMAL LOW (ref 1.15–1.40)
Chloride: 105 mmol/L (ref 98–111)
Creatinine, Ser: 1.3 mg/dL — ABNORMAL HIGH (ref 0.61–1.24)
Glucose, Bld: 119 mg/dL — ABNORMAL HIGH (ref 70–99)
HCT: 49 % (ref 39.0–52.0)
Hemoglobin: 16.7 g/dL (ref 13.0–17.0)
Potassium: 3.8 mmol/L (ref 3.5–5.1)
Sodium: 142 mmol/L (ref 135–145)
TCO2: 25 mmol/L (ref 22–32)

## 2022-07-03 LAB — COMPREHENSIVE METABOLIC PANEL
ALT: 26 U/L (ref 0–44)
AST: 35 U/L (ref 15–41)
Albumin: 3.8 g/dL (ref 3.5–5.0)
Alkaline Phosphatase: 62 U/L (ref 38–126)
Anion gap: 9 (ref 5–15)
BUN: 22 mg/dL (ref 8–23)
CO2: 25 mmol/L (ref 22–32)
Calcium: 8.8 mg/dL — ABNORMAL LOW (ref 8.9–10.3)
Chloride: 105 mmol/L (ref 98–111)
Creatinine, Ser: 1.36 mg/dL — ABNORMAL HIGH (ref 0.61–1.24)
GFR, Estimated: 58 mL/min — ABNORMAL LOW (ref >60)
Glucose, Bld: 123 mg/dL — ABNORMAL HIGH (ref 70–99)
Potassium: 4.2 mmol/L (ref 3.5–5.1)
Sodium: 139 mmol/L (ref 135–145)
Total Bilirubin: 0.8 mg/dL (ref 0.3–1.2)
Total Protein: 7.2 g/dL (ref 6.5–8.1)

## 2022-07-03 LAB — CBC WITH DIFFERENTIAL/PLATELET
Abs Immature Granulocytes: 0.02 10*3/uL (ref 0.00–0.07)
Basophils Absolute: 0.1 10*3/uL (ref 0.0–0.1)
Basophils Relative: 1 %
Eosinophils Absolute: 0.8 10*3/uL — ABNORMAL HIGH (ref 0.0–0.5)
Eosinophils Relative: 9 %
HCT: 44.7 % (ref 39.0–52.0)
Hemoglobin: 15.5 g/dL (ref 13.0–17.0)
Immature Granulocytes: 0 %
Lymphocytes Relative: 39 %
Lymphs Abs: 3.2 10*3/uL (ref 0.7–4.0)
MCH: 26.5 pg (ref 26.0–34.0)
MCHC: 34.7 g/dL (ref 30.0–36.0)
MCV: 76.5 fL — ABNORMAL LOW (ref 80.0–100.0)
Monocytes Absolute: 0.7 10*3/uL (ref 0.1–1.0)
Monocytes Relative: 9 %
Neutro Abs: 3.4 10*3/uL (ref 1.7–7.7)
Neutrophils Relative %: 42 %
Platelets: 200 10*3/uL (ref 150–400)
RBC: 5.84 MIL/uL — ABNORMAL HIGH (ref 4.22–5.81)
RDW: 14 % (ref 11.5–15.5)
WBC: 8.1 10*3/uL (ref 4.0–10.5)
nRBC: 0 % (ref 0.0–0.2)

## 2022-07-03 LAB — I-STAT VENOUS BLOOD GAS, ED
Acid-Base Excess: 2 mmol/L (ref 0.0–2.0)
Bicarbonate: 28 mmol/L (ref 20.0–28.0)
Calcium, Ion: 1.13 mmol/L — ABNORMAL LOW (ref 1.15–1.40)
HCT: 50 % (ref 39.0–52.0)
Hemoglobin: 17 g/dL (ref 13.0–17.0)
O2 Saturation: 78 %
Potassium: 3.9 mmol/L (ref 3.5–5.1)
Sodium: 142 mmol/L (ref 135–145)
TCO2: 29 mmol/L (ref 22–32)
pCO2, Ven: 47 mmHg (ref 44–60)
pH, Ven: 7.383 (ref 7.25–7.43)
pO2, Ven: 44 mmHg (ref 32–45)

## 2022-07-03 LAB — BRAIN NATRIURETIC PEPTIDE: B Natriuretic Peptide: 67.6 pg/mL (ref 0.0–100.0)

## 2022-07-03 LAB — RESP PANEL BY RT-PCR (FLU A&B, COVID) ARPGX2
Influenza A by PCR: NEGATIVE
Influenza B by PCR: NEGATIVE
SARS Coronavirus 2 by RT PCR: NEGATIVE

## 2022-07-03 MED ORDER — FUROSEMIDE 10 MG/ML IJ SOLN: 20.0000 mg | Freq: Once | INTRAMUSCULAR | Status: DC

## 2022-07-03 MED ORDER — FUROSEMIDE 20 MG PO TABS: 20.0000 mg | ORAL_TABLET | Freq: Every day | ORAL | 0 refills | Status: DC

## 2022-07-03 MED ORDER — PREDNISONE 10 MG PO TABS: 40.0000 mg | ORAL_TABLET | Freq: Every day | ORAL | 0 refills | Status: AC

## 2022-07-03 MED ORDER — AZITHROMYCIN 250 MG PO TABS: ORAL_TABLET | ORAL | 0 refills | Status: DC

## 2022-07-03 NOTE — ED Triage Notes (Signed)
Patient from home, was found with some respiratory distress.  Patient was found with O2 sat at 90% on RA.  Patient denies any respiratory issues.  No chest pain.  Breath sounds with rhonchi.

## 2022-07-03 NOTE — ED Provider Notes (Signed)
[facility] EMERGENCY DEPARTMENT Provider Note  CSN: [id number] Arrival date & time: 07/03/22 5621  Chief Complaint(s) Respiratory Distress  HPI Calvin Jackson is a 61 y.o. male with a past medical history listed below who presents to the emergency department with 1 day of gradually worsening shortness of breath that became severe tonight.  This prompted a call to EMS.  Patient endorses several weeks of bilateral lower extremity edema and several months of abdominal distention.  He denies any associated chest pain.  No recent fevers or infections.  He has mild nonproductive cough over the past several days.  No known sick contacts.  He denies a history of COPD, asthma, CHF. On review of records, patient has been previously seen for respiratory distress related to COPD   EMS noted patient's oxygen sats on room air was 90.  With bilateral rhonchi and wheezing.  Patient was given 1 albuterol neb and Solu-Medrol.  The history is provided by the patient and the EMS personnel.    Past Medical History Past Medical History:  Diagnosis Date   Diabetes mellitus with neurological manifestation (HCC)    Dyslipidemia    Essential hypertension    Marijuana abuse 10/15/2020   Stroke (HCC)    Tobacco dependence 10/15/2020   Patient Active Problem List   Diagnosis Date Noted   Cord compression [facility])    Acute CVA (cerebrovascular accident) (HCC) 10/15/2020   Marijuana abuse 10/15/2020   Tobacco dependence 10/15/2020   Essential hypertension    Dyslipidemia    Diabetes mellitus with neurological manifestation (HCC)    Home Medication(s) Prior to Admission medications   Medication Sig Start Date End Date Taking? Authorizing Provider  azithromycin (ZITHROMAX Z-PAK) 250 MG tablet Take 500 mg on day 1, then 250 mg once a day for days 2, 3, 4, and 5. 07/03/22  Yes Reeve Turnley, Amadeo Garnet, MD  furosemide (LASIX) 20 MG tablet Take 1 tablet (20 mg total) by mouth daily for 10 days.  07/03/22 07/13/22 Yes Jernee Murtaugh, Amadeo Garnet, MD  predniSONE (DELTASONE) 10 MG tablet Take 4 tablets (40 mg total) by mouth daily for 4 days. 07/03/22 07/07/22 Yes Conrado Nance, Amadeo Garnet, MD  acetaminophen (TYLENOL) 500 MG tablet Take 1,000 mg by mouth 3 (three) times daily as needed for mild pain.    [provider]  albuterol (VENTOLIN HFA) 108 (90 Base) MCG/ACT inhaler Inhale 1-2 puffs into the lungs every 6 (six) hours as needed for wheezing or shortness of breath. 02/28/22   Prosperi, Christian H, PA-C  amLODipine (NORVASC) 10 MG tablet Take 10 mg by mouth daily.    [provider]  aspirin 81 MG chewable tablet Chew 1 tablet (81 mg total) by mouth daily. 10/18/20   Lewie Chamber, MD  atorvastatin (LIPITOR) 40 MG tablet Take 1 tablet (40 mg total) by mouth daily. 10/18/20   Lewie Chamber, MD  benzonatate (TESSALON) 100 MG capsule Take 1 capsule (100 mg total) by mouth every 8 (eight) hours. 02/28/22   Prosperi, Christian H, PA-C  clopidogrel (PLAVIX) 75 MG tablet Take 75 mg by mouth daily.    [provider]  cyclobenzaprine (FLEXERIL) 10 MG tablet Take 10 mg by mouth at bedtime as needed for muscle spasms.  08/31/20   [provider]  erythromycin ophthalmic ointment Place a 1/2 inch ribbon of ointment into the lower eyelid four times a day for 7 days 03/24/22   Melene Plan, DO  famotidine (PEPCID) 40 MG tablet Take 1 tablet (40 mg  total) by mouth every evening. 10/18/20 11/13/21  Lewie Chamber, MD  FLOMAX 0.4 MG CAPS capsule Take 0.4 mg by mouth at bedtime. 08/31/20   [provider]  losartan (COZAAR) 100 MG tablet Take 100 mg by mouth daily. 08/31/20   [provider]  metFORMIN (GLUCOPHAGE) 500 MG tablet Take 500 mg by mouth in the morning and at bedtime. 08/31/20   [provider]  morphine (MSIR) 15 MG tablet Take 0.5 tablets (7.5 mg total) by mouth every 4 (four) hours as needed for severe pain. 03/24/22   Melene Plan, DO  pantoprazole  (PROTONIX) 40 MG tablet Take 1 tablet (40 mg total) by mouth daily. Patient not taking: No sig reported 09/24/21   Eber Hong, MD                                                                                                                                    Allergies Patient has no known allergies.  Review of Systems Review of Systems As noted in HPI  Physical Exam Vital Signs  I have reviewed the triage vital signs BP (!) 134/105   Pulse 66   Temp (!) 97.2 F (36.2 C) (Temporal)   Resp 11   SpO2 95%   Physical Exam Vitals reviewed.  Constitutional:      General: He is not in acute distress.    Appearance: He is well-developed. He is not diaphoretic.  HENT:     Head: Normocephalic and atraumatic.     Nose: Nose normal.  Eyes:     General: No scleral icterus.       Right eye: No discharge.        Left eye: No discharge.     Conjunctiva/sclera: Conjunctivae normal.     Pupils: Pupils are equal, round, and reactive to light.  Cardiovascular:     Rate and Rhythm: Normal rate and regular rhythm.     Heart sounds: No murmur heard.    No friction rub. No gallop.  Pulmonary:     Effort: Tachypnea and respiratory distress present.     Breath sounds: No stridor. Examination of the right-middle field reveals rhonchi. Examination of the left-middle field reveals rhonchi. Examination of the right-lower field reveals rhonchi. Examination of the left-lower field reveals rhonchi. Rhonchi present. No rales.  Abdominal:     General: There is distension.     Palpations: Abdomen is soft.     Tenderness: There is no abdominal tenderness.  Musculoskeletal:        General: No tenderness.     Cervical back: Normal range of motion and neck supple.     Right lower leg: 2+ Pitting Edema present.     Left lower leg: 2+ Pitting Edema present.  Skin:    General: Skin is warm and dry.     Findings: No erythema or rash.  Neurological:     Mental Status: He is  alert and oriented to person,  place, and time.     ED Results and Treatments Labs (all labs ordered are listed, but only abnormal results are displayed) Labs Reviewed  COMPREHENSIVE METABOLIC PANEL - Abnormal; Notable for the following components:      Result Value   Glucose, Bld 123 (*)    Creatinine, Ser 1.36 (*)    Calcium 8.8 (*)    GFR, Estimated 58 (*)    All other components within normal limits  CBC WITH DIFFERENTIAL/PLATELET - Abnormal; Notable for the following components:   RBC 5.84 (*)    MCV 76.5 (*)    Eosinophils Absolute 0.8 (*)    All other components within normal limits  I-STAT CHEM 8, ED - Abnormal; Notable for the following components:   BUN 28 (*)    Creatinine, Ser 1.30 (*)    Glucose, Bld 119 (*)    Calcium, Ion 1.00 (*)    All other components within normal limits  I-STAT VENOUS BLOOD GAS, ED - Abnormal; Notable for the following components:   Calcium, Ion 1.13 (*)    All other components within normal limits  RESP PANEL BY RT-PCR (FLU A&B, COVID) ARPGX2  BRAIN NATRIURETIC PEPTIDE                                                                                                                         EKG  EKG Interpretation  Date/Time:  Sunday July 03 2022 05:48:06 EDT Ventricular Rate:  71 PR Interval:  137 QRS Duration: 102 QT Interval:  393 QTC Calculation: 428 R Axis:   74 Text Interpretation: Sinus rhythm Biatrial enlargement Artifact in lead(s) I II III aVR aVL aVF V1 V2 Confirmed by Drema Pry [phone]) on 07/03/2022 6:57:13 AM       Radiology DG Chest Port 1 View  Result Date: 07/03/2022 CLINICAL DATA:  61 year old male with shortness of breath.  Smoker. EXAM: PORTABLE CHEST 1 VIEW COMPARISON:  Chest radiographs 02/28/2022. FINDINGS: Portable AP semi upright view at 0549 hours. Mildly lower lung volumes, with hyperinflation suspected previously. Stable cardiac size and mediastinal contours. Mildly tortuous thoracic aorta. No cardiomegaly. Visualized tracheal air  column is within normal limits. Coarse bilateral pulmonary interstitial markings appear stable since March, likely smoking related. Otherwise Allowing for portable technique the lungs are clear. Visible bowel gas pattern is stable and within normal limits. No acute osseous abnormality identified. IMPRESSION: No acute cardiopulmonary abnormality. Stable increased pulmonary interstitial markings which are probably smoking related. Electronically Signed   By: Odessa Fleming M.D.   On: 07/03/2022 05:59    Pertinent labs & imaging results that were available during my care of the patient were reviewed by me and considered in my medical decision making (see MDM for details).  Medications Ordered in ED Medications - No data to display  Procedures Procedures  (including critical care time)  Medical Decision Making / ED Course    Complexity of Problem:  Co-morbidities/SDOH that complicate the patient evaluation/care: Noted above in HPI  Additional history obtained: EMS  Patient's presenting problem/concern, DDX, and MDM listed below: Shortness of breath Patient with evidence of volume overload on exam.  We will need to rule out heart failure. We will also assess for any evidence of pneumonia, pneumothorax. We will get screening labs to assess for any electrolyte/metabolic derangements, renal sufficiency.  Hospitalization Considered:  Yes    Complexity of Data:   Cardiac Monitoring: The patient was maintained on a cardiac monitor.   I personally viewed and interpreted the cardiac monitored which showed an underlying rhythm of normal sinus rhythm EKG without acute ischemic changes, dysrhythmias or blocks  Laboratory Tests ordered listed below with my independent interpretation: CBC without leukocytosis or anemia Metabolic panel without significant electrolyte  derangement.  Mild renal sufficiency close to his baseline. COVID/influenza negative. No evidence of respiratory acidosis. BNP negative   Imaging Studies ordered listed below with my independent interpretation: Chest x-ray notable for chronic lung changes consistent with history of smoking.  No evidence of obvious pneumonia, pneumothorax or pulmonary edema Confirmed by radiology     ED Course:    Assessment, Add'l Intervention, and Reassessment: Shortness of breath Completely resolved following treatment with EMS He is satting well on room air No evidence of pneumonia, pneumothorax, heart failure. We will treat for COPD exacerbation with azithromycin and steroids. Patient does have peripheral edema Will Rx lasix    Final Clinical Impression(s) / ED Diagnoses Final diagnoses:  COPD exacerbation (HCC)  Peripheral edema   The patient appears reasonably screened and/or stabilized for discharge and I doubt any other medical condition or other [facility] requiring further screening, evaluation, or treatment in the ED at this time. I have discussed the findings, Dx and Tx plan with the patient/family who expressed understanding and agree(s) with the plan. Discharge instructions discussed at length. The patient/family was given strict return precautions who verbalized understanding of the instructions. No further questions at time of discharge.  Disposition: Discharge  Condition: Good  ED Discharge Orders          Ordered    furosemide (LASIX) 20 MG tablet  Daily        07/03/22 0746    azithromycin (ZITHROMAX Z-PAK) 250 MG tablet        07/03/22 0746    predniSONE (DELTASONE) 10 MG tablet  Daily        07/03/22 0746             Follow Up: Lavinia Sharps, NP [address] [place] [address] [phone]  Call  to schedule an appointment for close follow up           This chart was dictated using voice recognition software.  Despite best efforts to  proofread,  errors can occur which can change the documentation meaning.    Nira Conn, MD 07/03/22 [phone]

## 2022-07-14 ENCOUNTER — Telehealth: Payer: Self-pay | Admitting: Emergency Medicine

## 2022-07-14 NOTE — Telephone Encounter (Signed)
Attempted to contact patient by phone number listed in chart, says number is out of service. New appointment notice mailed to address listed on file.

## 2022-08-07 ENCOUNTER — Emergency Department (HOSPITAL_COMMUNITY)
Admission: EM | Admit: 2022-08-07 | Discharge: 2022-08-07 | Disposition: A | Discharge status: Home / Self Care | Payer: No Typology Code available for payment source | Attending: Emergency Medicine | Admitting: Emergency Medicine

## 2022-08-07 DIAGNOSIS — Z7902 Long term (current) use of antithrombotics/antiplatelets: Secondary | ICD-10-CM | POA: Diagnosis not present

## 2022-08-07 DIAGNOSIS — M545 Low back pain, unspecified: Secondary | ICD-10-CM | POA: Diagnosis not present

## 2022-08-07 DIAGNOSIS — Z7982 Long term (current) use of aspirin: Secondary | ICD-10-CM | POA: Insufficient documentation

## 2022-08-07 MED ORDER — KETOROLAC TROMETHAMINE 15 MG/ML IJ SOLN
15.0000 mg | Freq: Once | INTRAMUSCULAR | Status: AC
  Administered 2022-08-07: 15 mg via INTRAMUSCULAR
  Filled 2022-08-07: qty 1

## 2022-08-07 MED ORDER — ACETAMINOPHEN 500 MG PO TABS
1000.0000 mg | ORAL_TABLET | Freq: Once | ORAL | Status: AC
Start: 2022-08-07 — End: 2022-08-07
  Administered 2022-08-07: 1000 mg via ORAL
  Filled 2022-08-07: qty 2

## 2022-08-07 NOTE — ED Triage Notes (Signed)
BIB [facility] for back pain. Sudden onset today, lower lumbar, non radiating, no loss of bowel, no numbness. No trauma.

## 2022-08-07 NOTE — Discharge Instructions (Signed)
Your back pain is most likely due to a muscular strain.  There is been a lot of research on back pain, unfortunately the only thing that seems to really help is Tylenol and ibuprofen.  Relative rest is also important to not lift greater than 10 pounds bending or twisting at the waist.  Please follow-up with your family physician.  The other thing that really seems to benefit patients is physical therapy which your doctor may send you for.  Please return to the emergency department for new numbness or weakness to your arms or legs. Difficulty with urinating or urinating or pooping on yourself.  Also if you cannot feel toilet paper when you wipe or get a fever.   Take 4 over the counter ibuprofen tablets 3 times a day or 2 over-the-counter naproxen tablets twice a day for pain. Also take tylenol 1000mg(2 extra strength) four times a day.   Take 8 scoops of miralax in 32oz of whatever you would like to drink.(Gatorade comes in this size) You can also use a fleets enema which you can buy over the counter at the pharmacy.  Return for worsening abdominal pain, vomiting or fever.  

## 2022-08-07 NOTE — ED Provider Notes (Signed)
[facility] EMERGENCY DEPARTMENT Provider Note   CSN: [id number] Arrival date & time: 08/07/22  0250     History  No chief complaint on file.   Calvin Jackson is a 61 y.o. male.  61 yo M with a chief complaint of right-sided low back pain.  He noticed this today.  He denies radiation of the pain.  Larey Seat a few days ago but had no back pain until this afternoon.  He also has been having trouble moving his bowels.  Feels like the pain gets better when he passes gas.  Has had trouble having a good bowel movement.  No nausea or vomiting.  Has had some chronic urinary issues ever since he had a stroke.  Denies any change.  Denies loss of rectal sensation denies numbness or weakness of the legs.  Denies fever.        Home Medications Prior to Admission medications   Medication Sig Start Date End Date Taking? Authorizing Provider  acetaminophen (TYLENOL) 500 MG tablet Take 1,000 mg by mouth 3 (three) times daily as needed for mild pain.    [provider]  albuterol (VENTOLIN HFA) 108 (90 Base) MCG/ACT inhaler Inhale 1-2 puffs into the lungs every 6 (six) hours as needed for wheezing or shortness of breath. 02/28/22   Prosperi, Christian H, PA-C  amLODipine (NORVASC) 10 MG tablet Take 10 mg by mouth daily.    [provider]  aspirin 81 MG chewable tablet Chew 1 tablet (81 mg total) by mouth daily. 10/18/20   Lewie Chamber, MD  atorvastatin (LIPITOR) 40 MG tablet Take 1 tablet (40 mg total) by mouth daily. 10/18/20   Lewie Chamber, MD  azithromycin (ZITHROMAX Z-PAK) 250 MG tablet Take 500 mg on day 1, then 250 mg once a day for days 2, 3, 4, and 5. 07/03/22   Cardama, Amadeo Garnet, MD  benzonatate (TESSALON) 100 MG capsule Take 1 capsule (100 mg total) by mouth every 8 (eight) hours. 02/28/22   Prosperi, Christian H, PA-C  clopidogrel (PLAVIX) 75 MG tablet Take 75 mg by mouth daily.    [provider]  cyclobenzaprine (FLEXERIL) 10 MG tablet Take 10  mg by mouth at bedtime as needed for muscle spasms.  08/31/20   [provider]  erythromycin ophthalmic ointment Place a 1/2 inch ribbon of ointment into the lower eyelid four times a day for 7 days 03/24/22   Melene Plan, DO  famotidine (PEPCID) 40 MG tablet Take 1 tablet (40 mg total) by mouth every evening. 10/18/20 11/13/21  Lewie Chamber, MD  FLOMAX 0.4 MG CAPS capsule Take 0.4 mg by mouth at bedtime. 08/31/20   [provider]  furosemide (LASIX) 20 MG tablet Take 1 tablet (20 mg total) by mouth daily for 10 days. 07/03/22 07/13/22  Nira Conn, MD  losartan (COZAAR) 100 MG tablet Take 100 mg by mouth daily. 08/31/20   [provider]  metFORMIN (GLUCOPHAGE) 500 MG tablet Take 500 mg by mouth in the morning and at bedtime. 08/31/20   [provider]  morphine (MSIR) 15 MG tablet Take 0.5 tablets (7.5 mg total) by mouth every 4 (four) hours as needed for severe pain. 03/24/22   Melene Plan, DO  pantoprazole (PROTONIX) 40 MG tablet Take 1 tablet (40 mg total) by mouth daily. Patient not taking: No sig reported 09/24/21   Eber Hong, MD      Allergies    Patient has no known allergies.  Review of Systems   Review of Systems  Physical Exam Updated Vital Signs BP (!) 166/99   Pulse 73   Resp 18   Ht 5\' 10"  (1.778 m)   Wt 84 kg   SpO2 98%   BMI 26.57 kg/m  Physical Exam Vitals and nursing note reviewed.  Constitutional:      Appearance: He is well-developed.  HENT:     Head: Normocephalic and atraumatic.  Eyes:     Pupils: Pupils are equal, round, and reactive to light.  Neck:     Vascular: No JVD.  Cardiovascular:     Rate and Rhythm: Normal rate and regular rhythm.     Heart sounds: No murmur heard.    No friction rub. No gallop.  Pulmonary:     Effort: No respiratory distress.     Breath sounds: No wheezing.  Abdominal:     General: There is no distension.     Tenderness: There is no abdominal tenderness. There is no  guarding or rebound.  Musculoskeletal:        General: Normal range of motion.     Cervical back: Normal range of motion and neck supple.     Comments: Some pain about the right SI joint area.  No obvious midline tenderness step-offs or deformities.  Pulse motor and sensation intact distally.  Negative straight leg raise test.  Skin:    Coloration: Skin is not pale.     Findings: No rash.  Neurological:     Mental Status: He is alert and oriented to person, place, and time.  Psychiatric:        Behavior: Behavior normal.     ED Results / Procedures / Treatments   Labs (all labs ordered are listed, but only abnormal results are displayed) Labs Reviewed - No data to display  EKG None  Radiology No results found.  Procedures Procedures    Medications Ordered in ED Medications  acetaminophen (TYLENOL) tablet 1,000 mg (has no administration in time range)  ketorolac (TORADOL) 15 MG/ML injection 15 mg (has no administration in time range)    ED Course/ Medical Decision Making/ A&P                           Medical Decision Making Risk OTC drugs. Prescription drug management.   61 yo male with a chief complaint of right-sided low back pain.  Atraumatic no radiation of symptoms.  No concerning findings on exam.  We will treat as musculoskeletal.  Patient is also complaining of decreased stool output.  His symptoms do seem to improve with passing gas.  We will have him do a trial of MiraLAX cleanout at home.  PCP follow-up.  3:48 AM:  I have discussed the diagnosis/risks/treatment options with the patient.  Evaluation and diagnostic testing in the emergency department does not suggest an emergent condition requiring admission or immediate intervention beyond what has been performed at this time.  They will follow up with  PCP. We also discussed returning to the ED immediately if new or worsening sx occur. We discussed the sx which are most concerning (e.g., sudden worsening  pain, fever, inability to tolerate by mouth cauda equina signs or symptoms) that necessitate immediate return. Medications administered to the patient during their visit and any new prescriptions provided to the patient are listed below.  Medications given during this visit Medications  acetaminophen (TYLENOL) tablet 1,000 mg (has no administration in time range)  ketorolac (TORADOL) 15 MG/ML injection 15 mg (has no administration in time range)     The patient appears reasonably screen and/or stabilized for discharge and I doubt any other medical condition or other [facility] requiring further screening, evaluation, or treatment in the ED at this time prior to discharge.          Final Clinical Impression(s) / ED Diagnoses Final diagnoses:  Acute right-sided low back pain without sciatica    Rx / DC Orders ED Discharge Orders     None         Melene Plan, DO 08/07/22 [phone]

## 2022-09-27 ENCOUNTER — Ambulatory Visit: Payer: Medicaid Other | Admitting: Critical Care Medicine

## 2022-09-27 ENCOUNTER — Ambulatory Visit (INDEPENDENT_AMBULATORY_CARE_PROVIDER_SITE_OTHER): Payer: No Typology Code available for payment source | Admitting: Primary Care

## 2022-09-29 ENCOUNTER — Other Ambulatory Visit: Payer: Self-pay | Admitting: Family Medicine

## 2022-09-29 DIAGNOSIS — M4712 Other spondylosis with myelopathy, cervical region: Secondary | ICD-10-CM

## 2022-10-24 ENCOUNTER — Other Ambulatory Visit: Payer: Self-pay | Admitting: Family Medicine

## 2022-10-24 ENCOUNTER — Encounter: Payer: Self-pay | Admitting: Family Medicine

## 2022-10-24 ENCOUNTER — Ambulatory Visit
Admission: RE | Admit: 2022-10-24 | Discharge: 2022-10-24 | Disposition: A | Discharge status: Home / Self Care | Payer: Medicaid Other | Source: Ambulatory Visit | Attending: Family Medicine | Admitting: Family Medicine

## 2022-10-24 DIAGNOSIS — J42 Unspecified chronic bronchitis: Secondary | ICD-10-CM

## 2022-10-24 DIAGNOSIS — J449 Chronic obstructive pulmonary disease, unspecified: Secondary | ICD-10-CM

## 2022-11-11 ENCOUNTER — Other Ambulatory Visit: Payer: Self-pay | Admitting: Vascular Surgery

## 2022-11-11 ENCOUNTER — Ambulatory Visit
Admission: RE | Admit: 2022-11-11 | Discharge: 2022-11-11 | Disposition: A | Discharge status: Home / Self Care | Payer: No Typology Code available for payment source | Source: Ambulatory Visit | Attending: Vascular Surgery | Admitting: Vascular Surgery

## 2022-11-11 DIAGNOSIS — R269 Unspecified abnormalities of gait and mobility: Secondary | ICD-10-CM

## 2022-11-11 DIAGNOSIS — M25571 Pain in right ankle and joints of right foot: Secondary | ICD-10-CM

## 2022-12-05 HISTORY — PX: ANTERIOR CERVICAL DECOMP/DISCECTOMY FUSION: SHX1161

## 2023-01-30 ENCOUNTER — Encounter: Payer: Self-pay | Admitting: Vascular Surgery

## 2023-01-30 ENCOUNTER — Other Ambulatory Visit: Payer: Self-pay | Admitting: Vascular Surgery

## 2023-01-30 DIAGNOSIS — Z122 Encounter for screening for malignant neoplasm of respiratory organs: Secondary | ICD-10-CM

## 2023-01-31 ENCOUNTER — Encounter: Payer: Self-pay | Admitting: Vascular Surgery

## 2023-07-14 ENCOUNTER — Encounter (HOSPITAL_COMMUNITY): Payer: Self-pay

## 2023-07-14 ENCOUNTER — Other Ambulatory Visit: Payer: Self-pay

## 2023-07-14 ENCOUNTER — Observation Stay (HOSPITAL_COMMUNITY)
Admission: EM | Admit: 2023-07-14 | Discharge: 2023-07-19 | Disposition: A | Discharge status: Home / Self Care | Payer: Medicaid Other | Attending: Internal Medicine | Admitting: Internal Medicine

## 2023-07-14 ENCOUNTER — Emergency Department (HOSPITAL_COMMUNITY): Payer: Medicaid Other

## 2023-07-14 DIAGNOSIS — F17200 Nicotine dependence, unspecified, uncomplicated: Secondary | ICD-10-CM | POA: Diagnosis present

## 2023-07-14 DIAGNOSIS — Z7982 Long term (current) use of aspirin: Secondary | ICD-10-CM | POA: Insufficient documentation

## 2023-07-14 DIAGNOSIS — Z79899 Other long term (current) drug therapy: Secondary | ICD-10-CM | POA: Diagnosis not present

## 2023-07-14 DIAGNOSIS — E119 Type 2 diabetes mellitus without complications: Secondary | ICD-10-CM | POA: Insufficient documentation

## 2023-07-14 DIAGNOSIS — K5900 Constipation, unspecified: Principal | ICD-10-CM | POA: Diagnosis present

## 2023-07-14 DIAGNOSIS — J45909 Unspecified asthma, uncomplicated: Secondary | ICD-10-CM | POA: Insufficient documentation

## 2023-07-14 DIAGNOSIS — K567 Ileus, unspecified: Secondary | ICD-10-CM | POA: Diagnosis not present

## 2023-07-14 DIAGNOSIS — I739 Peripheral vascular disease, unspecified: Secondary | ICD-10-CM | POA: Insufficient documentation

## 2023-07-14 DIAGNOSIS — Z8673 Personal history of transient ischemic attack (TIA), and cerebral infarction without residual deficits: Secondary | ICD-10-CM

## 2023-07-14 DIAGNOSIS — Z7901 Long term (current) use of anticoagulants: Secondary | ICD-10-CM | POA: Insufficient documentation

## 2023-07-14 DIAGNOSIS — N1831 Chronic kidney disease, stage 3a: Secondary | ICD-10-CM | POA: Insufficient documentation

## 2023-07-14 DIAGNOSIS — F17210 Nicotine dependence, cigarettes, uncomplicated: Secondary | ICD-10-CM | POA: Insufficient documentation

## 2023-07-14 DIAGNOSIS — E785 Hyperlipidemia, unspecified: Secondary | ICD-10-CM | POA: Diagnosis present

## 2023-07-14 DIAGNOSIS — I10 Essential (primary) hypertension: Secondary | ICD-10-CM | POA: Diagnosis not present

## 2023-07-14 DIAGNOSIS — R2681 Unsteadiness on feet: Secondary | ICD-10-CM | POA: Insufficient documentation

## 2023-07-14 DIAGNOSIS — R140 Abdominal distension (gaseous): Secondary | ICD-10-CM | POA: Diagnosis present

## 2023-07-14 LAB — CBC
HCT: 43.5 % (ref 39.0–52.0)
Hemoglobin: 15.4 g/dL (ref 13.0–17.0)
MCH: 27 pg (ref 26.0–34.0)
MCHC: 35.4 g/dL (ref 30.0–36.0)
MCV: 76.3 fL — ABNORMAL LOW (ref 80.0–100.0)
Platelets: 199 10*3/uL (ref 150–400)
RBC: 5.7 MIL/uL (ref 4.22–5.81)
RDW: 14.2 % (ref 11.5–15.5)
WBC: 4.4 10*3/uL (ref 4.0–10.5)
nRBC: 0 % (ref 0.0–0.2)

## 2023-07-14 LAB — COMPREHENSIVE METABOLIC PANEL
ALT: 22 U/L (ref 0–44)
AST: 23 U/L (ref 15–41)
Albumin: 3.7 g/dL (ref 3.5–5.0)
Alkaline Phosphatase: 51 U/L (ref 38–126)
Anion gap: 15 (ref 5–15)
BUN: 30 mg/dL — ABNORMAL HIGH (ref 8–23)
CO2: 22 mmol/L (ref 22–32)
Calcium: 9 mg/dL (ref 8.9–10.3)
Chloride: 102 mmol/L (ref 98–111)
Creatinine, Ser: 1.42 mg/dL — ABNORMAL HIGH (ref 0.61–1.24)
GFR, Estimated: 55 mL/min — ABNORMAL LOW (ref >60)
Glucose, Bld: 187 mg/dL — ABNORMAL HIGH (ref 70–99)
Potassium: 3.9 mmol/L (ref 3.5–5.1)
Sodium: 139 mmol/L (ref 135–145)
Total Bilirubin: 0.9 mg/dL (ref 0.3–1.2)
Total Protein: 7.2 g/dL (ref 6.5–8.1)

## 2023-07-14 LAB — URINALYSIS, ROUTINE W REFLEX MICROSCOPIC
Bilirubin Urine: NEGATIVE
Glucose, UA: NEGATIVE mg/dL
Hgb urine dipstick: NEGATIVE
Ketones, ur: NEGATIVE mg/dL
Leukocytes,Ua: NEGATIVE
Nitrite: NEGATIVE
Protein, ur: NEGATIVE mg/dL
Specific Gravity, Urine: 1.024 (ref 1.005–1.030)
pH: 5 (ref 5.0–8.0)

## 2023-07-14 LAB — LIPASE, BLOOD: Lipase: 30 U/L (ref 11–51)

## 2023-07-14 MED ORDER — SODIUM CHLORIDE 0.9 % IV BOLUS
1000.0000 mL | Freq: Once | INTRAVENOUS | Status: AC
  Administered 2023-07-14: 1000 mL via INTRAVENOUS

## 2023-07-14 MED ORDER — IOHEXOL 350 MG/ML SOLN
65.0000 mL | Freq: Once | INTRAVENOUS | Status: AC | PRN
  Administered 2023-07-14: 65 mL via INTRAVENOUS

## 2023-07-14 MED ORDER — SODIUM CHLORIDE 0.9 % IV BOLUS
500.0000 mL | Freq: Once | INTRAVENOUS | Status: AC
  Administered 2023-07-14: 500 mL via INTRAVENOUS

## 2023-07-14 NOTE — ED Provider Notes (Signed)
[place] EMERGENCY DEPARTMENT AT [facility] Provider Note   CSN: [id number] Arrival date & time: 07/14/23  1820     History {Add pertinent medical, surgical, social history, OB history to HPI:1} Chief Complaint  Patient presents with   Abdominal Pain    Calvin Jackson is a 62 y.o. male.  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  Reports progressive distention of his abdomen over the past 2 days.  He has been experiencing more pressure, especially on his right side.  Notes some associated right groin discomfort with movement of his right lower extremity as well.  He did have a small, hard bowel movement earlier today.  Recently, has been experiencing urinary hesitancy and decreased urinary stream.  Does have a history of BPH, per sister at bedside.  Denies fever, nausea, vomiting, melena or hematochezia, dysuria.  No prior abdominal surgeries.   Abdominal Pain      Home Medications Prior to Admission medications   Medication Sig Start Date End Date Taking? Authorizing Provider  acetaminophen (TYLENOL) 500 MG tablet Take 1,000 mg by mouth 3 (three) times daily as needed for mild pain.    [provider]  albuterol (VENTOLIN HFA) 108 (90 Base) MCG/ACT inhaler Inhale 1-2 puffs into the lungs every 6 (six) hours as needed for wheezing or shortness of breath. 02/28/22   Prosperi, Christian H, PA-C  amLODipine (NORVASC) 10 MG tablet Take 10 mg by mouth daily.    [provider]  aspirin 81 MG chewable tablet Chew 1 tablet (81 mg total) by mouth daily. 10/18/20   Lewie Chamber, MD  atorvastatin (LIPITOR) 40 MG tablet Take 1 tablet (40 mg total) by mouth daily. 10/18/20   Lewie Chamber, MD  azithromycin (ZITHROMAX Z-PAK) 250 MG tablet Take 500 mg on day 1, then 250 mg once a day for days 2, 3, 4,  and 5. 07/03/22   Cardama, Amadeo Garnet, MD  benzonatate (TESSALON) 100 MG capsule Take 1 capsule (100 mg total) by mouth every 8 (eight) hours. 02/28/22   Prosperi, Christian H, PA-C  clopidogrel (PLAVIX) 75 MG tablet Take 75 mg by mouth daily.    [provider]  cyclobenzaprine (FLEXERIL) 10 MG tablet Take 10 mg by mouth at bedtime as needed for muscle spasms.  08/31/20   [provider]  erythromycin ophthalmic ointment Place a 1/2 inch ribbon of ointment into the lower eyelid four times a day for 7 days 03/24/22   Melene Plan, DO  famotidine (PEPCID) 40 MG tablet Take 1 tablet (40 mg total) by mouth every evening. 10/18/20 11/13/21  Lewie Chamber, MD  FLOMAX 0.4 MG CAPS capsule Take 0.4 mg by mouth at bedtime. 08/31/20   [provider]  furosemide (LASIX) 20 MG tablet Take 1 tablet (20 mg total) by mouth daily for 10 days. 07/03/22 07/13/22  Nira Conn, MD  losartan (COZAAR) 100 MG tablet Take 100 mg by mouth daily. 08/31/20   [provider]  metFORMIN (GLUCOPHAGE) 500 MG tablet Take 500 mg by mouth in the morning and at bedtime. 08/31/20   [provider]  morphine (MSIR) 15 MG tablet Take 0.5 tablets (7.5 mg total) by mouth every 4 (four) hours as needed for severe pain. 03/24/22   Melene Plan, DO  pantoprazole (PROTONIX) 40 MG tablet Take 1 tablet (40 mg  total) by mouth daily. Patient not taking: No sig reported 09/24/21   Eber Hong, MD      Allergies    Patient has no known allergies.    Review of Systems   Review of Systems  Gastrointestinal:  Positive for abdominal pain.  Ten systems reviewed and are negative for acute change, except as noted in the HPI.    Physical Exam Updated Vital Signs BP (!) 169/104   Pulse (!) 51   Temp 98.1 F (36.7 C) (Oral)   Resp 17   Ht 5\' 10"  (1.778 m)   Wt 73.5 kg   SpO2 98%   BMI 23.24 kg/m   Physical Exam Vitals and nursing note reviewed.  Constitutional:      General: He is not in  acute distress.    Appearance: He is well-developed. He is not diaphoretic.     Comments: Nontoxic appearing, in NAD  HENT:     Head: Normocephalic and atraumatic.  Eyes:     General: No scleral icterus.    Conjunctiva/sclera: Conjunctivae normal.  Cardiovascular:     Rate and Rhythm: Normal rate and regular rhythm.     Pulses: Normal pulses.  Pulmonary:     Effort: Pulmonary effort is normal. No respiratory distress.     Breath sounds: No stridor. No wheezing.     Comments: Lungs CTAB. Respirations even and unlabored. Abdominal:     General: There is distension.     Palpations: There is no mass.     Tenderness: There is no guarding.     Hernia: A hernia is present.     Comments: Firm, distended abdomen with mild discomfort generally but no focal abdominal TTP. Umbilical hernia is noted which is soft. No strangulation or incarceration noted. No overlying skin changes or ecchymosis. No peritoneal signs on exam. Bowel sounds noted throughout.  Musculoskeletal:        General: Normal range of motion.     Cervical back: Normal range of motion.     Comments: RLE leg brace  Skin:    General: Skin is warm and dry.     Coloration: Skin is not pale.     Findings: No erythema or rash.  Neurological:     Mental Status: He is alert and oriented to person, place, and time.     Coordination: Coordination normal.     Comments: Alert and oriented to person, place, time. Patient answers questions appropriately and follows commands.  Psychiatric:        Behavior: Behavior normal.     ED Results / Procedures / Treatments   Labs (all labs ordered are listed, but only abnormal results are displayed) Labs Reviewed  COMPREHENSIVE METABOLIC PANEL - Abnormal; Notable for the following components:      Result Value   Glucose, Bld 187 (*)    BUN 30 (*)    Creatinine, Ser 1.42 (*)    GFR, Estimated 55 (*)    All other components within normal limits  CBC - Abnormal; Notable for the following  components:   MCV 76.3 (*)    All other components within normal limits  LIPASE, BLOOD  URINALYSIS, ROUTINE W REFLEX MICROSCOPIC    EKG None  Radiology No results found.  Procedures Procedures  {Document cardiac monitor, telemetry assessment procedure when appropriate:1}  Medications Ordered in ED Medications  sodium chloride 0.9 % bolus 1,000 mL (1,000 mLs Intravenous New Bag/Given 07/14/23 2205)    ED Course/ Medical Decision Making/ A&P   {  Click here for ABCD2, HEART and other calculatorsREFRESH Note before signing :1}                              Medical Decision Making Amount and/or Complexity of Data Reviewed Labs: ordered.   ***  {Document critical care time when appropriate:1} {Document review of labs and clinical decision tools ie heart score, Chads2Vasc2 etc:1}  {Document your independent review of radiology images, and any outside records:1} {Document your discussion with family members, caretakers, and with consultants:1} {Document social determinants of health affecting pt's care:1} {Document your decision making why or why not admission, treatments were needed:1} Final Clinical Impression(s) / ED Diagnoses Final diagnoses:  None    Rx / DC Orders ED Discharge Orders     None

## 2023-07-14 NOTE — ED Triage Notes (Signed)
Pt presents with abd pain and swelling x 2 days. Pt states he has a hernia that is causing the pain. On inspection pt's abd is swollen and taught. Denies N/V. Reports a small hard BM this AM.

## 2023-07-15 ENCOUNTER — Encounter (HOSPITAL_COMMUNITY): Payer: Self-pay

## 2023-07-15 ENCOUNTER — Emergency Department (HOSPITAL_COMMUNITY): Payer: Medicaid Other

## 2023-07-15 DIAGNOSIS — K5939 Other megacolon: Secondary | ICD-10-CM

## 2023-07-15 DIAGNOSIS — K5900 Constipation, unspecified: Secondary | ICD-10-CM | POA: Diagnosis not present

## 2023-07-15 DIAGNOSIS — K6289 Other specified diseases of anus and rectum: Secondary | ICD-10-CM | POA: Diagnosis not present

## 2023-07-15 LAB — LIPID PANEL
Cholesterol: 138 mg/dL (ref 0–200)
HDL: 69 mg/dL (ref >40)
LDL Cholesterol: 62 mg/dL (ref 0–99)
Total CHOL/HDL Ratio: 2 RATIO
Triglycerides: 35 mg/dL (ref <150)
VLDL: 7 mg/dL (ref 0–40)

## 2023-07-15 LAB — CBC
HCT: 44.9 % (ref 39.0–52.0)
Hemoglobin: 16 g/dL (ref 13.0–17.0)
MCH: 27 pg (ref 26.0–34.0)
MCHC: 35.6 g/dL (ref 30.0–36.0)
MCV: 75.7 fL — ABNORMAL LOW (ref 80.0–100.0)
Platelets: 178 10*3/uL (ref 150–400)
RBC: 5.93 MIL/uL — ABNORMAL HIGH (ref 4.22–5.81)
RDW: 14.4 % (ref 11.5–15.5)
WBC: 8.3 10*3/uL (ref 4.0–10.5)
nRBC: 0 % (ref 0.0–0.2)

## 2023-07-15 LAB — CREATININE, SERUM
Creatinine, Ser: 1.28 mg/dL — ABNORMAL HIGH (ref 0.61–1.24)
GFR, Estimated: >60 mL/min (ref >60)

## 2023-07-15 LAB — HEMOGLOBIN A1C
Hgb A1c MFr Bld: 5.7 % — ABNORMAL HIGH (ref 4.8–5.6)
Mean Plasma Glucose: 116.89 mg/dL

## 2023-07-15 LAB — HIV ANTIBODY (ROUTINE TESTING W REFLEX): HIV Screen 4th Generation wRfx: NONREACTIVE

## 2023-07-15 LAB — GLUCOSE, CAPILLARY
Glucose-Capillary: 140 mg/dL — ABNORMAL HIGH (ref 70–99)
Glucose-Capillary: 171 mg/dL — ABNORMAL HIGH (ref 70–99)
Glucose-Capillary: 153 mg/dL — ABNORMAL HIGH (ref 70–99)

## 2023-07-15 MED ORDER — INSULIN ASPART 100 UNIT/ML IJ SOLN
0.0000 [IU] | Freq: Three times a day (TID) | INTRAMUSCULAR | Status: DC
  Administered 2023-07-15: 2 [IU] via SUBCUTANEOUS
  Administered 2023-07-15: 1 [IU] via SUBCUTANEOUS
  Administered 2023-07-16 (×2): 2 [IU] via SUBCUTANEOUS
  Administered 2023-07-17 – 2023-07-19 (×3): 1 [IU] via SUBCUTANEOUS

## 2023-07-15 MED ORDER — HYDRALAZINE HCL 20 MG/ML IJ SOLN: 10.0000 mg | Freq: Four times a day (QID) | INTRAMUSCULAR | Status: DC | PRN

## 2023-07-15 MED ORDER — AMLODIPINE BESYLATE 5 MG PO TABS
5.0000 mg | ORAL_TABLET | Freq: Every day | ORAL | Status: DC
  Administered 2023-07-15: 5 mg via ORAL
  Filled 2023-07-15: qty 1

## 2023-07-15 MED ORDER — SORBITOL 70 % SOLN
960.0000 mL | TOPICAL_OIL | Freq: Once | ORAL | Status: AC
  Administered 2023-07-15: 960 mL via RECTAL
  Filled 2023-07-15: qty 240

## 2023-07-15 MED ORDER — INSULIN ASPART 100 UNIT/ML IJ SOLN: 0.0000 [IU] | Freq: Every day | INTRAMUSCULAR | Status: DC

## 2023-07-15 MED ORDER — LOSARTAN POTASSIUM 25 MG PO TABS
25.0000 mg | ORAL_TABLET | Freq: Every day | ORAL | Status: DC
  Administered 2023-07-16 – 2023-07-19 (×4): 25 mg via ORAL
  Filled 2023-07-15 (×4): qty 1

## 2023-07-15 MED ORDER — BISACODYL 5 MG PO TBEC
20.0000 mg | DELAYED_RELEASE_TABLET | Freq: Once | ORAL | Status: AC
  Administered 2023-07-15: 20 mg via ORAL
  Filled 2023-07-15: qty 4

## 2023-07-15 MED ORDER — POLYETHYLENE GLYCOL 3350 17 GM/SCOOP PO POWD
1.0000 | Freq: Once | ORAL | Status: AC
  Administered 2023-07-15: 255 g via ORAL
  Filled 2023-07-15: qty 255

## 2023-07-15 MED ORDER — SORBITOL 70 % SOLN
960.0000 mL | TOPICAL_OIL | Freq: Three times a day (TID) | ORAL | Status: AC
  Administered 2023-07-15 – 2023-07-16 (×3): 960 mL via RECTAL
  Filled 2023-07-15 (×4): qty 240

## 2023-07-15 MED ORDER — ENOXAPARIN SODIUM 40 MG/0.4ML IJ SOSY
40.0000 mg | PREFILLED_SYRINGE | INTRAMUSCULAR | Status: DC
  Administered 2023-07-15 – 2023-07-19 (×5): 40 mg via SUBCUTANEOUS
  Filled 2023-07-15 (×5): qty 0.4

## 2023-07-15 NOTE — H&P (Signed)
History and Physical    Patient: Calvin Jackson FAO:[id number] DOB: 06-10-1961 DOA: 07/14/2023 DOS: the patient was seen and examined on 07/15/2023 PCP: [facility]  Patient coming from: Home  Chief Complaint:  Chief Complaint  Patient presents with   Abdominal Pain   HPI: Calvin Jackson is a 62 y.o. male with medical history significant of prior CVA, hypertension, dyslipidemia, diabetes mellitus.  Patient presented to the ER complaining of abdominal pain and swelling for 2 days.  Patient told triage nurse that he felt that his abdominal hernia was causing him some pain.  During my evaluation of the patient he reported he had communicated with his doctor and was instructed to take Mylanta and Gatorade to help with symptoms but symptoms did not improve.  On date of presentation (07/14/2023) patient reported small hard BM prior to arrival.  In the ED patient has remained afebrile but hypertensive and nonhypoxic.  CT abdomen and pelvis with contrast was completed that revealed a large rectosigmoid colonic stool burden reflecting fecal impaction without evidence of stercoral colitis.  The rectosigmoid colon is dilated up to 17.1 cm.  GI has been consulted.  Hospitalist service asked to evaluate the patient for admission.  While in the ER patient had been given a smog enema x 1.  Initially patient had a moderate amount of soft stool with some formed stool present that was expelled during the enema also had passage of flatus with enema.  Since that time patient has had multiple episodes of brown liquid stool.  We repeated plain abdominal x-ray that revealed multiple areas of stool burden throughout the colon with a larger amount in the distal sigmoid just above the rectal vault correlating with the degree of colonic dilatation seen on CT scan.  In review of documentation patient apparently has a legal guardian that is not court appointed.  He also has a healthcare power of  attorney as well as a living will but these documents have not been scanned into our system.  His first person to contact is his sister Calvin Jackson.   Review of Systems: As mentioned in the history of present illness. All other systems reviewed and are negative.  Past Medical History:  Diagnosis Date   Diabetes mellitus with neurological manifestation (HCC)    Dyslipidemia    Essential hypertension    Marijuana abuse 10/15/2020   Stroke [facility])    Tobacco dependence 10/15/2020   History reviewed. No pertinent surgical history. Social History:  reports that he has been smoking. He has a 23.5 pack-year smoking history. He has never used smokeless tobacco. He reports that he does not currently use alcohol. He reports current drug use. Drug: Marijuana.  No Known Allergies  History reviewed. No pertinent family history.  Prior to Admission medications   Medication Sig Start Date End Date Taking? Authorizing Provider  acetaminophen (TYLENOL) 650 MG CR tablet Take 650 mg by mouth every 8 (eight) hours as needed for pain.   Yes [provider]  methocarbamol (ROBAXIN) 500 MG tablet Take 500 mg by mouth every 8 (eight) hours as needed. 12/13/22  Yes [provider]  nicotine polacrilex (NICORETTE) 2 MG gum Take 2 mg by mouth as needed for smoking cessation. 12/06/22  Yes [provider]  senna-docusate (SENOKOT-S) 8.6-50 MG tablet Take 2 tablets by mouth at bedtime. 12/06/22  Yes [provider]  albuterol (VENTOLIN HFA) 108 (90 Base) MCG/ACT inhaler Inhale 1-2 puffs into the lungs every 6 (  six) hours as needed for wheezing or shortness of breath. 02/28/22   Prosperi, Christian H, PA-C  amLODipine (NORVASC) 10 MG tablet Take 10 mg by mouth daily.    [provider]  aspirin 81 MG chewable tablet Chew 1 tablet (81 mg total) by mouth daily. 10/18/20   Lewie Chamber, MD  atorvastatin (LIPITOR) 40 MG tablet Take 1 tablet (40 mg total) by mouth daily. Patient  taking differently: Take 20 mg by mouth daily. 10/18/20   Lewie Chamber, MD  clopidogrel (PLAVIX) 75 MG tablet Take 75 mg by mouth daily.    [provider]  FLOMAX 0.4 MG CAPS capsule Take 0.4 mg by mouth at bedtime. 08/31/20   [provider]  losartan (COZAAR) 100 MG tablet Take 50 mg by mouth daily. 08/31/20   [provider]  morphine (MSIR) 15 MG tablet Take 0.5 tablets (7.5 mg total) by mouth every 4 (four) hours as needed for severe pain. 03/24/22   Melene Plan, DO    Physical Exam: Vitals:   07/15/23 0315 07/15/23 0802 07/15/23 0808 07/15/23 1030  BP: (!) 159/99 (!) 168/98  (!) 172/111  Pulse: 64 74  84  Resp: (!) 21 14    Temp: 98.3 F (36.8 C)  98.6 F (37 C)   TempSrc: Oral  Oral   SpO2: 97% 100%  99%  Weight:      Height:       Respiratory: clear to auscultation bilaterally, no wheezing, no crackles. Normal respiratory effort. No accessory muscle use.  Cardiovascular: Regular rate and rhythm, no murmurs / rubs / gallops. No extremity edema. 2+ pedal pulses. No carotid bruits.  Abdomen: Markedly distended and tympanitic.  Hypoactive tinkling bowel sounds noted.  Nontender..  Given degree of abdominal distention unable to accurately determine if hepatosplenomegaly present. Musculoskeletal: no clubbing / cyanosis. No joint deformity upper and lower extremities. Good ROM, no contractures. Normal muscle tone.  Skin: no rashes, lesions, ulcers. No induration Neurologic: CN 2-12 grossly intact. Sensation intact, DTR normal. Strength 5/5 x all 4 extremities.  Psychiatric: Alert and oriented x 3. Normal mood.    Data Reviewed:  Sodium 139, potassium 3.9, CO2 22, glucose 197, BUN 30, creatinine 1.42, LFTs are normal, GFR 55  White count 4400, hemoglobin 15.4, MCV 76.3, platelets 199,000  Urinalysis unremarkable  CT abdomen and pelvis as above  Follow-up abdominal x-ray has not been read by radiologist but continues to show scattered areas of colonic  stool burden  Assessment and Plan: Obstipation with abdominal distention but no bowel obstruction Discussed with GI who will see in consultation Continues to pass flatus and liquid stool Will give 20 mg of oral Dulcolax and in 1 hour begin a MiraLAX bowel prep Clear liquids Patient denies abdominal pain, bowel sounds are present although hypoactive.  No signs of obstructive process  Diabetes mellitus 2 Unclear if diet controlled or on medications Check hemoglobin A1c Supplement with sliding scale insulin  History of CVA Patient is mostly oriented but chart states he has a 9 court appointed legal guardian No documentation located in chart but next point of contact is his sister Unclear if he remains on aspirin and statin  Hypertension Currently not controlled Previously has been on Norvasc and has been given one 5 mg dose.  Further review in care everywhere reveals patient now apparently on Cozaar 25 mg daily so this has been ordered Have also ordered IV hydralazine as needed  Chronic kidney disease stage IIIa Baseline creatinine has  ranged between 1.18 and 1.36 Current creatinine is 1.42 with a GFR of 55 Follow creatinine on clear liquids and if necessary supplement with gentle IV fluid hydration  Dyslipidemia Check lipid panel    Advance Care Planning:   Code Status: Full Code   VTE prophylaxis: Lovenox  Consults: Gastroenterology  Family Communication: Patient only  Severity of Illness: The appropriate patient status for this patient is OBSERVATION. Observation status is judged to be reasonable and necessary in order to provide the required intensity of service to ensure the patient's safety. The patient's presenting symptoms, physical exam findings, and initial radiographic and laboratory data in the context of their medical condition is felt to place them at decreased risk for further clinical deterioration. Furthermore, it is anticipated that the patient will be  medically stable for discharge from the hospital within 2 midnights of admission.   Author: Junious Silk, NP 07/15/2023 10:43 AM  For on call review [email or web address].

## 2023-07-15 NOTE — ED Notes (Addendum)
Moderate amount of soft stool with some formed stool present expelled during enema. Pt also able to pass gas following enema. Pt reports relief following enema.

## 2023-07-15 NOTE — Plan of Care (Signed)

## 2023-07-15 NOTE — ED Notes (Signed)
ED TO INPATIENT HANDOFF REPORT  ED Nurse Name and Phone #: [phone]  S Name/Age/Gender [name] 62 y.o. male Room/Bed: 043C/043C  Code Status   Code Status: Prior  Home/SNF/Other Home Patient oriented to: self, place, time, and situation Is this baseline? Yes   Triage Complete: Triage complete  Chief Complaint Obstipation [K59.00]  Triage Note Pt presents with abd pain and swelling x 2 days. Pt states he has a hernia that is causing the pain. On inspection pt's abd is swollen and taught. Denies N/V. Reports a small hard BM this AM.    Allergies No Known Allergies  Level of Care/Admitting Diagnosis ED Disposition     ED Disposition  Admit   Condition  --   Comment  Hospital Area: [facility] [[id number]]  Level of Care: Med-Surg [16]  May place patient in observation at [facility] or [facility] if equivalent level of care is available:: Yes  Covid Evaluation: Asymptomatic - no recent exposure (last 10 days) testing not required  Diagnosis: Obstipation [956213]  Admitting Physician: Carma Leaven [[id number]]  Attending Physician: Russella Dar [[id number]]          B Medical/Surgery History Past Medical History:  Diagnosis Date   Diabetes mellitus with neurological manifestation (HCC)    Dyslipidemia    Essential hypertension    Marijuana abuse 10/15/2020   Stroke (HCC)    Tobacco dependence 10/15/2020   History reviewed. No pertinent surgical history.   A IV Location/Drains/Wounds Patient Lines/Drains/Airways Status     Active Line/Drains/Airways     Name Placement date Placement time Site Days   Peripheral IV 07/14/23 20 G Anterior;Left Forearm 07/14/23  2204  Forearm  1            Intake/Output Last 24 hours  Intake/Output Summary (Last 24 hours) at 07/15/2023 6962 Last data filed at 07/15/2023 0127 Gross per 24 hour  Intake 500 ml  Output --  Net 500 ml    Labs/Imaging Results for orders placed or performed  during the hospital encounter of 07/14/23 (from the past 48 hour(s))  Urinalysis, Routine w reflex microscopic -Urine, Clean Catch     Status: None   Collection Time: 07/14/23  6:36 PM  Result Value Ref Range   Color, Urine YELLOW YELLOW   APPearance CLEAR CLEAR   Specific Gravity, Urine 1.024 1.005 - 1.030   pH 5.0 5.0 - 8.0   Glucose, UA NEGATIVE NEGATIVE mg/dL   Hgb urine dipstick NEGATIVE NEGATIVE   Bilirubin Urine NEGATIVE NEGATIVE   Ketones, ur NEGATIVE NEGATIVE mg/dL   Protein, ur NEGATIVE NEGATIVE mg/dL   Nitrite NEGATIVE NEGATIVE   Leukocytes,Ua NEGATIVE NEGATIVE    Comment: Performed at [facility], [address], [place], [place] [address]  Lipase, blood     Status: None   Collection Time: 07/14/23  6:41 PM  Result Value Ref Range   Lipase 30 11 - 51 U/L    Comment: Performed at [facility], [address], [place], [place] [address]  Comprehensive metabolic panel     Status: Abnormal   Collection Time: 07/14/23  6:41 PM  Result Value Ref Range   Sodium 139 135 - 145 mmol/L   Potassium 3.9 3.5 - 5.1 mmol/L   Chloride 102 98 - 111 mmol/L   CO2 22 22 - 32 mmol/L   Glucose, Bld 187 (H) 70 - 99 mg/dL    Comment: Glucose reference range applies only to samples taken  after fasting for at least 8 hours.   BUN 30 (H) 8 - 23 mg/dL   Creatinine, Ser 1.61 (H) 0.61 - 1.24 mg/dL   Calcium 9.0 8.9 - 09.6 mg/dL   Total Protein 7.2 6.5 - 8.1 g/dL   Albumin 3.7 3.5 - 5.0 g/dL   AST 23 15 - 41 U/L   ALT 22 0 - 44 U/L   Alkaline Phosphatase 51 38 - 126 U/L   Total Bilirubin 0.9 0.3 - 1.2 mg/dL   GFR, Estimated 55 (L) >60 mL/min    Comment: (NOTE) Calculated using the CKD-EPI Creatinine Equation (2021)    Anion gap 15 5 - 15    Comment: Performed at [facility], [address], [place], [place] [address]  CBC     Status: Abnormal   Collection Time: 07/14/23  6:41 PM  Result Value Ref Range   WBC 4.4 4.0 - 10.5 K/uL   RBC 5.70 4.22 - 5.81 MIL/uL    Hemoglobin 15.4 13.0 - 17.0 g/dL   HCT 98.1 19.1 - 47.8 %   MCV 76.3 (L) 80.0 - 100.0 fL   MCH 27.0 26.0 - 34.0 pg   MCHC 35.4 30.0 - 36.0 g/dL   RDW 29.5 62.1 - 30.8 %   Platelets 199 150 - 400 K/uL   nRBC 0.0 0.0 - 0.2 %    Comment: Performed at [facility], [address], [place], [place] [address]   CT ABDOMEN PELVIS W CONTRAST  Result Date: 07/14/2023 CLINICAL DATA:  Abdominal pain/swelling, suspected hernia EXAM: CT ABDOMEN AND PELVIS WITH CONTRAST TECHNIQUE: Multidetector CT imaging of the abdomen and pelvis was performed using the standard protocol following bolus administration of intravenous contrast. RADIATION DOSE REDUCTION: This exam was performed according to the departmental dose-optimization program which includes automated exposure control, adjustment of the mA and/or kV according to patient size and/or use of iterative reconstruction technique. CONTRAST:  65mL OMNIPAQUE IOHEXOL 350 MG/ML SOLN COMPARISON:  09/24/2021 FINDINGS: Lower chest: Lung bases are clear. Hepatobiliary: Scattered subcentimeter hepatic cysts. Gallbladder is unremarkable. No intrahepatic or extrahepatic duct dilatation. Pancreas: Within normal limits. Spleen: Within normal limits. Adrenals/Urinary Tract: Adrenal glands are within normal limits. Kidneys are within normal limits. Mild fullness of the right renal collecting system, without frank hydronephrosis. Bladder is displaced beneath the left anterior abdominal wall. Stomach/Bowel: Small hiatal hernia with mild wall thickening/edema along the distal esophagus, unchanged from remote prior. No evidence of small bowel obstruction. Appendix is not discretely visualized, possibly related to extrinsic compression. Large rectosigmoid colonic stool burden, reflecting fecal impaction, although remarkably similar to remote prior. Rectosigmoid colon measures up to 17.1 cm in maximal transverse dimension (series 3/image 63). No wall thickening, pneumatosis, or  inflammatory changes to suggest stercoral colitis. Vascular/Lymphatic: No evidence of abdominal aortic aneurysm. Atherosclerotic calcifications of the abdominal aorta and branch vessels, although patent. No suspicious abdominopelvic lymphadenopathy. Reproductive: Prostate is unremarkable. Other: No abdominopelvic ascites. Musculoskeletal: Visualized osseous structures are within normal limits. IMPRESSION: Large rectosigmoid colonic stool burden, reflecting fecal impaction, although remarkably similar to remote prior. No evidence of stercoral colitis. Rectosigmoid colon is dilated up to 17.1 cm. Additional ancillary findings as above. Electronically Signed   By: Charline Bills M.D.   On: 07/14/2023 23:00    Pending Labs Unresulted Labs (From admission, onward)    None       Vitals/Pain Today's Vitals   07/15/23 0100 07/15/23 0315 07/15/23 0802 07/15/23 0808  BP: (!) 178/118 (!) 159/99 Marland Kitchen)  168/98   Pulse: 62 64 74   Resp: 16 (!) 21 14   Temp: 97.8 F (36.6 C) 98.3 F (36.8 C)  98.6 F (37 C)  TempSrc: Oral Oral  Oral  SpO2: 95% 97% 100%   Weight:      Height:      PainSc:        Isolation Precautions No active isolations  Medications Medications  bisacodyl (DULCOLAX) EC tablet 20 mg (has no administration in time range)    Followed by  polyethylene glycol powder (GLYCOLAX/MIRALAX) container 255 g (has no administration in time range)  sodium chloride 0.9 % bolus 1,000 mL (0 mLs Intravenous Stopped 07/14/23 2357)  iohexol (OMNIPAQUE) 350 MG/ML injection 65 mL (65 mLs Intravenous Contrast Given 07/14/23 2242)  sodium chloride 0.9 % bolus 500 mL (0 mLs Intravenous Stopped 07/15/23 0127)  sorbitol, milk of mag, mineral oil, glycerin (SMOG) enema (960 mLs Rectal Given 07/15/23 0125)    Mobility walks with device     Focused Assessments    R Recommendations: See Admitting Provider Note  Report given to:   Additional Notes:

## 2023-07-15 NOTE — ED Notes (Addendum)
Pt had several incontinent episodes of liquid diarrhea. Estimated 5 unmeasured episodes of watery, brown stool. Pt's linens changed, pt washed and gowned, and returned to bed. EVS called to assist with clean up.

## 2023-07-15 NOTE — ED Notes (Signed)
Patient transported to X-ray 

## 2023-07-15 NOTE — Consult Note (Addendum)
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  Raises question of neurogenic bowel issues.  He does have some symptoms that could represent neurogenic bladder also.   Need to think about additional lumbosacral spine workup.   It sounds like he is symptomatically improved since coming into the hospital and receiving a MiraLAX purge.  He needs a chronic bowel regimen.  The most cost effective may be MiraLAX and bisacodyl.  Will follow-up tomorrow.  Repeat abdominal film in the morning.  Iva Boop, MD, [facility] See Loretha Stapler on call - gastroenterology for best contact person 07/15/2023 1:44 PM       HPI:   Callaghan Foerst is a 62 y.o. male who presented to the emergency department because of abdominal distention and some right lower quadrant groin discomfort.  He felt his hernia was protruding.  Imaging revealed very dilated rectum and sigmoid colon full of stool.  Patient was admitted for therapy.  He feels better after having a MiraLAX purge and a smog enema.  He says he chronically has constipation.  CT scanning in 2022 demonstrated similar findings to the scan here.  He tells me he can walk and ambulate at home.  He lives with his sister he goes to [facility].  He reports a history of colonoscopy when he lived in [place] a number of years ago but does not remember the date and apparently there were no particular abnormalities.  He does not have rectal bleeding.  No nausea or vomiting.  No difficulty eating, dysphagia.  Past Medical History:  Diagnosis Date   Diabetes mellitus with neurological manifestation  (HCC)    Dyslipidemia    Essential hypertension    Marijuana abuse 10/15/2020   Stroke (HCC)    Tobacco dependence 10/15/2020    Past Surgical History:  Procedure Laterality Date   ANTERIOR CERVICAL DECOMP/DISCECTOMY FUSION  2024   COLONOSCOPY     Unknown date-[place]    History reviewed. No pertinent family history.   Social History   Tobacco Use   Smoking status: Every Day    Current packs/day: 0.50    Average packs/day: 0.5 packs/day for 47.0 years (23.5 ttl pk-yrs)    Types: Cigarettes   Smokeless tobacco: Never   Tobacco comments:    declines patch - reports vivid dreams  Substance Use Topics   Alcohol use: Not Currently   Drug use: Yes    Types: Marijuana    Comment: occasional use    Prior to Admission medications   Medication Sig Start Date End Date Taking? Authorizing Provider  acetaminophen (TYLENOL) 650 MG CR tablet Take 650 mg by mouth every 8 (eight) hours as needed for pain.   Yes [provider]  albuterol (VENTOLIN HFA) 108 (90 Base) MCG/ACT inhaler Inhale 1-2 puffs into the lungs every 6 (six) hours as needed for wheezing or shortness of breath. 02/28/22  Yes Prosperi, Christian H, PA-C  amLODipine (NORVASC) 10 MG tablet Take 10 mg by mouth daily.   Yes [provider]  aspirin 81 MG chewable tablet Chew 1 tablet (81 mg  total) by mouth daily. 10/18/20  Yes Lewie Chamber, MD  atorvastatin (LIPITOR) 20 MG tablet Take 20 mg by mouth daily.   Yes [provider]  cholecalciferol (VITAMIN D3) 25 MCG (1000 UNIT) tablet Take 1,000 Units by mouth daily.   Yes [provider]  losartan (COZAAR) 50 MG tablet Take 50 mg by mouth daily. 08/31/20  Yes [provider]  methocarbamol (ROBAXIN) 500 MG tablet Take 500 mg by mouth every 8 (eight) hours as needed. 12/13/22  Yes [provider]  nicotine polacrilex (NICORETTE) 2 MG gum Take 2 mg by mouth as needed for smoking cessation. 12/06/22  Yes [provider]   senna-docusate (SENOKOT-S) 8.6-50 MG tablet Take 2 tablets by mouth at bedtime. 12/06/22  Yes [provider]    Current Facility-Administered Medications  Medication Dose Route Frequency Provider Last Rate Last Admin   enoxaparin (LOVENOX) injection 40 mg  40 mg Subcutaneous Q24H Russella Dar, NP   40 mg at 07/15/23 1232   hydrALAZINE (APRESOLINE) injection 10 mg  10 mg Intravenous Q6H PRN Russella Dar, NP       insulin aspart (novoLOG) injection 0-5 Units  0-5 Units Subcutaneous QHS Russella Dar, NP       insulin aspart (novoLOG) injection 0-9 Units  0-9 Units Subcutaneous TID WC Russella Dar, NP   1 Units at 07/15/23 1231   [START ON 07/16/2023] losartan (COZAAR) tablet 25 mg  25 mg Oral Daily Russella Dar, NP       polyethylene glycol powder (GLYCOLAX/MIRALAX) container 255 g  1 Container Oral Once Russella Dar, NP       sorbitol, milk of mag, mineral oil, glycerin (SMOG) enema  960 mL Rectal Q8H Azucena Fallen, MD        Allergies as of 07/14/2023   (No Known Allergies)     Review of Systems:    This is positive for those things mentioned in the HPI. All other review of systems are negative.       Physical Exam:  Vital signs in last 24 hours: Temp:  [97.7 F (36.5 C)-99.2 F (37.3 C)] 99.2 F (37.3 C) (08/10 1048) Pulse Rate:  [51-84] 76 (08/10 1048) Resp:  [14-21] 18 (08/10 1048) BP: (155-178)/(97-118) 171/105 (08/10 1048) SpO2:  [95 %-100 %] 97 % (08/10 1048) FiO2 (%):  [0 %] 0 % (08/10 1003) Weight:  [73.5 kg] 73.5 kg (08/10 1048) Last BM Date : 07/15/23  General:  Thin chronically ill black man no acute distress,  eyes:  anicteric.  Lungs: Clear to auscultation bilaterally. Heart:   .  S1S2, no rubs, murmurs, gallops. Abdomen: Protuberant, there is an umbilical hernia which is soft and a small ventral hernia above that that is easily reducible versus a diastases recti that is atypical.  He is nontender and bowel sounds are  present.    Rectal: Tender with some anal stenosis and very dilated rectum no impaction Extremities:   no edema Skin   no rash. Neuro:  A&O x 3.  Right hemiparesis Psych:  appropriate mood and  Affect.   Data Reviewed:   LAB RESULTS: Recent Labs    07/14/23 1841 07/15/23 1110  WBC 4.4 8.3  HGB 15.4 16.0  HCT 43.5 44.9  PLT 199 178   BMET Recent Labs    07/14/23 1841 07/15/23 1110  NA 139  --   K 3.9  --   CL 102  --   CO2  22  --   GLUCOSE 187*  --   BUN 30*  --   CREATININE 1.42* 1.28*  CALCIUM 9.0  --    LFT Recent Labs    07/14/23 1841  PROT 7.2  ALBUMIN 3.7  AST 23  ALT 22  ALKPHOS 51  BILITOT 0.9   PT/INR No results for input(s): "LABPROT", "INR" in the last 72 hours.  STUDIES: DG Abd 2 Views  Result Date: 07/15/2023 CLINICAL DATA:  Constipatio constipation n bike EXAM: ABDOMEN - 2 VIEW COMPARISON:  CT 07/14/2023, 09/24/2021 FINDINGS: Large stool burden in the rectosigmoid colon as seen on comparison CT 1 day prior. Rectosigmoid colon distended to 13 cm. Stool burden extends into the mid abdomen. gas-filled loops of LEFT and RIGHT colon identified which are normal caliber. No intraperitoneal free air. No small bowel dilatation. IMPRESSION: Dramatically large stool burden within the rectosigmoid colon unchanged from CT 1 day prior. Findings similar to but advanced from more remote CT 09/24/2021. Electronically Signed   By: Genevive Bi M.D.   On: 07/15/2023 11:34   CT ABDOMEN PELVIS W CONTRAST  Result Date: 07/14/2023 CLINICAL DATA:  Abdominal pain/swelling, suspected hernia EXAM: CT ABDOMEN AND PELVIS WITH CONTRAST TECHNIQUE: Multidetector CT imaging of the abdomen and pelvis was performed using the standard protocol following bolus administration of intravenous contrast. RADIATION DOSE REDUCTION: This exam was performed according to the departmental dose-optimization program which includes automated exposure control, adjustment of the mA and/or kV  according to patient size and/or use of iterative reconstruction technique. CONTRAST:  65mL OMNIPAQUE IOHEXOL 350 MG/ML SOLN COMPARISON:  09/24/2021 FINDINGS: Lower chest: Lung bases are clear. Hepatobiliary: Scattered subcentimeter hepatic cysts. Gallbladder is unremarkable. No intrahepatic or extrahepatic duct dilatation. Pancreas: Within normal limits. Spleen: Within normal limits. Adrenals/Urinary Tract: Adrenal glands are within normal limits. Kidneys are within normal limits. Mild fullness of the right renal collecting system, without frank hydronephrosis. Bladder is displaced beneath the left anterior abdominal wall. Stomach/Bowel: Small hiatal hernia with mild wall thickening/edema along the distal esophagus, unchanged from remote prior. No evidence of small bowel obstruction. Appendix is not discretely visualized, possibly related to extrinsic compression. Large rectosigmoid colonic stool burden, reflecting fecal impaction, although remarkably similar to remote prior. Rectosigmoid colon measures up to 17.1 cm in maximal transverse dimension (series 3/image 63). No wall thickening, pneumatosis, or inflammatory changes to suggest stercoral colitis. Vascular/Lymphatic: No evidence of abdominal aortic aneurysm. Atherosclerotic calcifications of the abdominal aorta and branch vessels, although patent. No suspicious abdominopelvic lymphadenopathy. Reproductive: Prostate is unremarkable. Other: No abdominopelvic ascites. Musculoskeletal: Visualized osseous structures are within normal limits. IMPRESSION: Large rectosigmoid colonic stool burden, reflecting fecal impaction, although remarkably similar to remote prior. No evidence of stercoral colitis. Rectosigmoid colon is dilated up to 17.1 cm. Additional ancillary findings as above. Electronically Signed   By: Charline Bills M.D.   On: 07/14/2023 23:00       Thanks   LOS: 0 days   @  Sena Slate, MD, [facility] @  07/15/2023, 1:44 PM

## 2023-07-16 ENCOUNTER — Observation Stay (HOSPITAL_COMMUNITY): Payer: Medicaid Other

## 2023-07-16 DIAGNOSIS — K5900 Constipation, unspecified: Secondary | ICD-10-CM | POA: Diagnosis not present

## 2023-07-16 LAB — CBC
HCT: 43.1 % (ref 39.0–52.0)
Hemoglobin: 14.9 g/dL (ref 13.0–17.0)
MCH: 26.4 pg (ref 26.0–34.0)
MCHC: 34.6 g/dL (ref 30.0–36.0)
MCV: 76.3 fL — ABNORMAL LOW (ref 80.0–100.0)
Platelets: 181 10*3/uL (ref 150–400)
RBC: 5.65 MIL/uL (ref 4.22–5.81)
RDW: 14.4 % (ref 11.5–15.5)
WBC: 11.8 10*3/uL — ABNORMAL HIGH (ref 4.0–10.5)
nRBC: 0 % (ref 0.0–0.2)

## 2023-07-16 LAB — BASIC METABOLIC PANEL WITH GFR
Anion gap: 13 (ref 5–15)
BUN: 35 mg/dL — ABNORMAL HIGH (ref 8–23)
CO2: 22 mmol/L (ref 22–32)
Calcium: 8.5 mg/dL — ABNORMAL LOW (ref 8.9–10.3)
Chloride: 101 mmol/L (ref 98–111)
Creatinine, Ser: 1.4 mg/dL — ABNORMAL HIGH (ref 0.61–1.24)
GFR, Estimated: 56 mL/min — ABNORMAL LOW (ref >60)
Glucose, Bld: 173 mg/dL — ABNORMAL HIGH (ref 70–99)
Potassium: 4.1 mmol/L (ref 3.5–5.1)
Sodium: 136 mmol/L (ref 135–145)

## 2023-07-16 LAB — GLUCOSE, CAPILLARY
Glucose-Capillary: 193 mg/dL — ABNORMAL HIGH (ref 70–99)
Glucose-Capillary: 160 mg/dL — ABNORMAL HIGH (ref 70–99)
Glucose-Capillary: 110 mg/dL — ABNORMAL HIGH (ref 70–99)
Glucose-Capillary: 124 mg/dL — ABNORMAL HIGH (ref 70–99)

## 2023-07-16 LAB — HEMOGLOBIN AND HEMATOCRIT, BLOOD
HCT: 40.3 % (ref 39.0–52.0)
Hemoglobin: 14.1 g/dL (ref 13.0–17.0)

## 2023-07-16 MED ORDER — POLYETHYLENE GLYCOL 3350 17 GM/SCOOP PO POWD: 17.0000 g | Freq: Two times a day (BID) | ORAL | 2 refills | Status: AC

## 2023-07-16 MED ORDER — SENNOSIDES-DOCUSATE SODIUM 8.6-50 MG PO TABS: 2.0000 | ORAL_TABLET | Freq: Two times a day (BID) | ORAL | 2 refills | Status: AC

## 2023-07-16 NOTE — Discharge Summary (Addendum)
Physician Discharge Summary  Calvin Jackson ZOX:[id number] DOB: 05-15-1961 DOA: 07/14/2023  PCP: [facility]  Admit date: 07/14/2023 Discharge date: 07/16/2023  Admitted From: Home Disposition:  Home  Recommendations for Outpatient Follow-up:  Follow up with PCP in 1-2 weeks Please obtain BMP/CBC in one week Please follow up on the following pending results:  Home Health:None  Equipment/Devices:None  Discharge Condition:Stable  CODE STATUS:Full  Diet recommendation:  High fiber low fat diet  Brief/Interim Summary: Calvin Jackson is a 62 y.o. male with medical history significant of prior CVA, hypertension, dyslipidemia, diabetes mellitus.  Patient presented to the ER complaining of abdominal pain and swelling for 2 days.  Patient had multiple bowel movements overnight with supportive care, feels markedly improved.  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  Patient improved on MiraLAX and Senokot alone, will continue this regimen at discharge.  Recommend patient follow-up with [facility] at [facility] this week as well as discussed possible need for GI consult.  *Patient remains medically stable for discharge, slip and fall from bedside commode while unattended with no assistance - given his walker-bound baseline this is not surprising, no notable trauma - no indication for imaging. Hgb recheck due to reported black stool remains stable. He is otherwise stable for discharge home.   Family has not picked up any phone calls from the hospital thus discharge is being delayed.  Discharge Diagnoses:  Principal Problem:   Obstipation    Discharge Instructions   Allergies as of 07/16/2023   No Known Allergies      Medication List     STOP  taking these medications    amLODipine 10 MG tablet Commonly known as: NORVASC       TAKE these medications    acetaminophen 650 MG CR tablet Commonly known as: TYLENOL Take 650 mg by mouth every 8 (eight) hours as needed for pain.   albuterol 108 (90 Base) MCG/ACT inhaler Commonly known as: VENTOLIN HFA Inhale 1-2 puffs into the lungs every 6 (six) hours as needed for wheezing or shortness of breath.   aspirin 81 MG chewable tablet Chew 1 tablet (81 mg total) by mouth daily.   atorvastatin 20 MG tablet Commonly known as: LIPITOR Take 20 mg by mouth daily.   cholecalciferol 25 MCG (1000 UNIT) tablet Commonly known as: VITAMIN D3 Take 1,000 Units by mouth daily.   losartan 50 MG tablet Commonly known as: COZAAR Take 50 mg by mouth daily.   methocarbamol 500 MG tablet Commonly known as: ROBAXIN Take 500 mg by mouth every 8 (eight) hours as needed.   nicotine polacrilex 2 MG gum Commonly known as: NICORETTE Take 2 mg by mouth as needed for smoking cessation.   polyethylene glycol powder 17 GM/SCOOP powder Commonly known as: GlycoLax Take 17 g by mouth 2 (two) times daily.   senna-docusate 8.6-50 MG tablet Commonly known as: Senokot-S Take 2 tablets by mouth 2 (two) times daily. What changed: when to take this        No Known Allergies  Consultations: GI   Procedures/Studies: DG Abd Portable 1V  Result Date: 07/16/2023 CLINICAL DATA:  [id number] Ileus (HCC) [id number] EXAM: PORTABLE ABDOMEN - 1 VIEW COMPARISON:  07/15/2023 abdominal radiograph FINDINGS: No disproportionately dilated small bowel loops. Prominent diffuse colonic gaseous distension, most prominent in the sigmoid  colon, improved, sigmoid diameter 15.5 cm, previously 18.0 cm. Large reduction in colonic stool burden. No evidence of pneumatosis or pneumoperitoneum. IMPRESSION: Prominent diffuse colonic gaseous distension, improved, most prominent in the sigmoid colon, compatible with improving ileus. Large  reduction in colonic stool burden. Electronically Signed   By: Delbert Phenix M.D.   On: 07/16/2023 11:12   DG Abd 2 Views  Result Date: 07/15/2023 CLINICAL DATA:  Constipatio constipation n bike EXAM: ABDOMEN - 2 VIEW COMPARISON:  CT 07/14/2023, 09/24/2021 FINDINGS: Large stool burden in the rectosigmoid colon as seen on comparison CT 1 day prior. Rectosigmoid colon distended to 13 cm. Stool burden extends into the mid abdomen. gas-filled loops of LEFT and RIGHT colon identified which are normal caliber. No intraperitoneal free air. No small bowel dilatation. IMPRESSION: Dramatically large stool burden within the rectosigmoid colon unchanged from CT 1 day prior. Findings similar to but advanced from more remote CT 09/24/2021. Electronically Signed   By: Genevive Bi M.D.   On: 07/15/2023 11:34   CT ABDOMEN PELVIS W CONTRAST  Result Date: 07/14/2023 CLINICAL DATA:  Abdominal pain/swelling, suspected hernia EXAM: CT ABDOMEN AND PELVIS WITH CONTRAST TECHNIQUE: Multidetector CT imaging of the abdomen and pelvis was performed using the standard protocol following bolus administration of intravenous contrast. RADIATION DOSE REDUCTION: This exam was performed according to the departmental dose-optimization program which includes automated exposure control, adjustment of the mA and/or kV according to patient size and/or use of iterative reconstruction technique. CONTRAST:  65mL OMNIPAQUE IOHEXOL 350 MG/ML SOLN COMPARISON:  09/24/2021 FINDINGS: Lower chest: Lung bases are clear. Hepatobiliary: Scattered subcentimeter hepatic cysts. Gallbladder is unremarkable. No intrahepatic or extrahepatic duct dilatation. Pancreas: Within normal limits. Spleen: Within normal limits. Adrenals/Urinary Tract: Adrenal glands are within normal limits. Kidneys are within normal limits. Mild fullness of the right renal collecting system, without frank hydronephrosis. Bladder is displaced beneath the left anterior abdominal wall.  Stomach/Bowel: Small hiatal hernia with mild wall thickening/edema along the distal esophagus, unchanged from remote prior. No evidence of small bowel obstruction. Appendix is not discretely visualized, possibly related to extrinsic compression. Large rectosigmoid colonic stool burden, reflecting fecal impaction, although remarkably similar to remote prior. Rectosigmoid colon measures up to 17.1 cm in maximal transverse dimension (series 3/image 63). No wall thickening, pneumatosis, or inflammatory changes to suggest stercoral colitis. Vascular/Lymphatic: No evidence of abdominal aortic aneurysm. Atherosclerotic calcifications of the abdominal aorta and branch vessels, although patent. No suspicious abdominopelvic lymphadenopathy. Reproductive: Prostate is unremarkable. Other: No abdominopelvic ascites. Musculoskeletal: Visualized osseous structures are within normal limits. IMPRESSION: Large rectosigmoid colonic stool burden, reflecting fecal impaction, although remarkably similar to remote prior. No evidence of stercoral colitis. Rectosigmoid colon is dilated up to 17.1 cm. Additional ancillary findings as above. Electronically Signed   By: Charline Bills M.D.   On: 07/14/2023 23:00     Subjective: No acute issues/events overnight   Discharge Exam: Vitals:   07/16/23 0531 07/16/23 0841  BP: (!) 121/91 129/71  Pulse: 81 94  Resp: 19   Temp: 98.1 F (36.7 C) 98.1 F (36.7 C)  SpO2: 96% 100%   Vitals:   07/15/23 1718 07/15/23 2021 07/16/23 0531 07/16/23 0841  BP: (!) 142/87 (!) 156/97 (!) 121/91 129/71  Pulse: 72 73 81 94  Resp: 17 17 19    Temp: 98 F (36.7 C) 98.1 F (36.7 C) 98.1 F (36.7 C) 98.1 F (36.7 C)  TempSrc: Oral     SpO2: 98% 97% 96% 100%  Weight:  Height:        General: Pt is alert, awake, not in acute distress Cardiovascular: RRR, S1/S2 +, no rubs, no gallops Respiratory: CTA bilaterally, no wheezing, no rhonchi Abdominal: Soft, NT, minimally distended,  distant bowel sounds Extremities: no edema, no cyanosis    The results of significant diagnostics from this hospitalization (including imaging, microbiology, ancillary and laboratory) are listed below for reference.     Microbiology: No results found for this or any previous visit (from the past 240 hour(s)).   Labs: Basic Metabolic Panel: Recent Labs  Lab 07/14/23 1841 07/15/23 1110 07/16/23 0442  NA 139  --  136  K 3.9  --  4.1  CL 102  --  101  CO2 22  --  22  GLUCOSE 187*  --  173*  BUN 30*  --  35*  CREATININE 1.42* 1.28* 1.40*  CALCIUM 9.0  --  8.5*   Liver Function Tests: Recent Labs  Lab 07/14/23 1841  AST 23  ALT 22  ALKPHOS 51  BILITOT 0.9  PROT 7.2  ALBUMIN 3.7   Recent Labs  Lab 07/14/23 1841  LIPASE 30   CBC: Recent Labs  Lab 07/14/23 1841 07/15/23 1110 07/16/23 0442  WBC 4.4 8.3 11.8*  HGB 15.4 16.0 14.9  HCT 43.5 44.9 43.1  MCV 76.3* 75.7* 76.3*  PLT 199 178 181   CBG: Recent Labs  Lab 07/15/23 1143 07/15/23 1721 07/15/23 2022 07/16/23 0754 07/16/23 1156  GLUCAP 140* 171* 153* 193* 160*   Hgb A1c Recent Labs    07/15/23 1110  HGBA1C 5.7*   Lipid Profile Recent Labs    07/15/23 1110  CHOL 138  HDL 69  LDLCALC 62  TRIG 35  CHOLHDL 2.0   Urinalysis    Component Value Date/Time   COLORURINE YELLOW 07/14/2023 1836   APPEARANCEUR CLEAR 07/14/2023 1836   LABSPEC 1.024 07/14/2023 1836   PHURINE 5.0 07/14/2023 1836   GLUCOSEU NEGATIVE 07/14/2023 1836   HGBUR NEGATIVE 07/14/2023 1836   BILIRUBINUR NEGATIVE 07/14/2023 1836   KETONESUR NEGATIVE 07/14/2023 1836   PROTEINUR NEGATIVE 07/14/2023 1836   NITRITE NEGATIVE 07/14/2023 1836   LEUKOCYTESUR NEGATIVE 07/14/2023 1836   Sepsis Labs Recent Labs  Lab 07/14/23 1841 07/15/23 1110 07/16/23 0442  WBC 4.4 8.3 11.8*   Microbiology No results found for this or any previous visit (from the past 240 hour(s)).   Time coordinating discharge: Over 30  minutes  SIGNED:   Azucena Fallen, DO [facility] 07/16/2023, 12:23 PM Pager   If 7PM-7AM, please contact night-coverage [email or web address]

## 2023-07-16 NOTE — Evaluation (Signed)
Physical Therapy Evaluation Patient Details Name: Calvin Jackson MRN: [id number] DOB: Nov 01, 1961 Today's Date: 07/16/2023  History of Present Illness  Pt is a 61 y.o. male who presented 07/14/23 with abdominal pain and swelling x2 days.  Imaging revealed very dilated rectum and sigmoid colon full of stool. Pt admitted with obstipation. Pt also had unwitnessed fall earlier today, 8/11. 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 Pt is currently disoriented, believing he is at home in his bedroom despite repeatedly telling him he was at [facility] >5x within 10 minutes. Thus, he likely is a poor historian but no family is present to confirm info. Pt reports he is mod I with a RW vs cane for functional mobility at baseline and lives with his sister in a 1-level house with 3 STE. Attempted to call sister but went straight to automated message with no ability to leave message. Called friend on file who answered but was difficult to understand and did not seem aware of the situation. Nursing reports they have been unable to get a hold of family and have not seen family either. Per chart, pt has baseline residual R weakness from a prior CVA, which is noted upon eval. At this time, pt is needing modA for bed mobility and transfers. He was unable to take but 1 step at EOB with the RW and modA, needing assistance to advance his R foot to step. He was also unable to maintain static sitting balance at EOB without LOB and modA to recover. He is displaying deficits in cognition, balance, power, activity tolerance, and strength and is at high risk for falls. It is unclear if this is his baseline or if he has had a functional decline as there is no family to confirm and he is an unreliable historian. Given his current functional state and lack of family support, he would benefit  from inpatient rehab, < 3 hours/day, as he would be unsafe to be home alone/without adequate support at this time. If the family can be reached and can confirm they can provide the level of assistance he needs then he could d/c home with HHPT follow-up. Will continue to follow acutely.      If plan is discharge home, recommend the following: Two people to help with walking and/or transfers;A lot of help with bathing/dressing/bathroom;Assistance with cooking/housework;Direct supervision/assist for medications management;Direct supervision/assist for financial management;Assist for transportation;Help with stairs or ramp for entrance   Can travel by private vehicle   No    Equipment Recommendations Rolling walker (2 wheels);BSC/3in1;Wheelchair (measurements PT);Wheelchair cushion (measurements PT);Hospital bed  Recommendations for Other Services  OT consult    Functional Status Assessment Patient has had a recent decline in their functional status and demonstrates the ability to make significant improvements in function in a reasonable and predictable amount of time.     Precautions / Restrictions Precautions Precautions: Fall;Other (comment) Precaution Comments: incontinent Restrictions Weight Bearing Restrictions: No      Mobility  Bed Mobility Overal bed mobility: Needs Assistance Bed Mobility: Supine to Sit, Sit to Supine     Supine to sit: Mod assist, Used rails, HOB elevated Sit to supine: Max assist, HOB elevated   General bed mobility comments: Pt able to bring legs off EOB and grab rail to try to pull self up to sit R EOB but ultimately could not fully  get his trunk upright with modA, even given extra time and multiple attempts. MaxA to manage trunk and legs back to supine.    Transfers Overall transfer level: Needs assistance Equipment used: Rolling walker (2 wheels) Transfers: Sit to/from Stand Sit to Stand: Mod assist           General transfer comment: Pt  pulling up on RW with bil UEs, needing modA to power up to stand, extend hips and knees, and gain balance from EOB, x3 reps.    Ambulation/Gait Ambulation/Gait assistance: Mod assist Gait Distance (Feet): 1 Feet Assistive device: Rolling walker (2 wheels) Gait Pattern/deviations: Step-to pattern, Decreased step length - right, Decreased stride length, Decreased dorsiflexion - right, Knee flexed in stance - right, Knee flexed in stance - left, Trunk flexed Gait velocity: reduced Gait velocity interpretation: <1.31 ft/sec, indicative of household ambulator   General Gait Details: Pt took x1 step anteriorly with L then was unable to advance his R foot without assistance, displaying toe drag. Knees flexed in stance (R>L) and trunk flexed also, sinking into increased flexion after ~2-3 sec and needing repeated cues to extend. Pt with urinary incontinence when standing  Stairs            Wheelchair Mobility     Tilt Bed    Modified Rankin (Stroke Patients Only)       Balance Overall balance assessment: Needs assistance Sitting-balance support: Single extremity supported, Bilateral upper extremity supported, Feet supported Sitting balance-Leahy Scale: Poor Sitting balance - Comments: Pt with posterior and R bias and LOB, needing modA for static sitting balance. Postural control: Right lateral lean, Posterior lean Standing balance support: Bilateral upper extremity supported, During functional activity, Reliant on assistive device for balance Standing balance-Leahy Scale: Poor Standing balance comment: Reliant on RW and modA, maintaining a flexed posture                             Pertinent Vitals/Pain Pain Assessment Pain Assessment: Faces Faces Pain Scale: No hurt Pain Intervention(s): Monitored during session    Home Living Family/patient expects to be discharged to:: Private residence Living Arrangements: Other relatives (sister) Available Help at Discharge:  Family;Available 24 hours/day;Personal care attendant Type of Home: House Home Access: Stairs to enter Entrance Stairs-Rails: Can reach both Entrance Stairs-Number of Steps: 3   Home Layout: One level Home Equipment: Agricultural consultant (2 wheels);Cane - single point;BSC/3in1;Shower seat Additional Comments: Aide comes in mornings    Prior Function Prior Level of Function : Needs assist;Patient poor historian/Family not available             Mobility Comments: Reports using RW vs cane for mod I functional mobility ADLs Comments: Aide comes in mornings to assist with ADLs     Extremity/Trunk Assessment   Upper Extremity Assessment Upper Extremity Assessment: Defer to OT evaluation    Lower Extremity Assessment Lower Extremity Assessment: RLE deficits/detail;Generalized weakness RLE Deficits / Details: R weaker than L, per chart pt has residual R sided weakness from a prior CVA    Cervical / Trunk Assessment Cervical / Trunk Assessment: Kyphotic  Communication   Communication Communication: No apparent difficulties  Cognition Arousal: Alert Behavior During Therapy: Flat affect Overall Cognitive Status: Impaired/Different from baseline Area of Impairment: Orientation, Attention, Memory, Safety/judgement, Following commands, Awareness, Problem solving                 Orientation Level: Disoriented to, Place (did not ask about  situation or time) Current Attention Level: Sustained Memory: Decreased short-term memory Following Commands: Follows one step commands consistently, Follows one step commands with increased time, Follows multi-step commands inconsistently Safety/Judgement: Decreased awareness of safety, Decreased awareness of deficits Awareness: Intellectual Problem Solving: Slow processing, Decreased initiation, Difficulty sequencing, Requires verbal cues, Requires tactile cues General Comments: Pt with flat affect. He repeatedly believes he is at home in his  bedroom and his sister is in the living room, even despite this therapist educating him >5x in a 10 min time period that he is at the hospital. Slow to process cues and initiate mobility with poor awareness of his deficits. Pt told the RN he was going to take the bus home but could not take a step with this PT. Poor awareness of his LOB in sitting and how to correct it.        General Comments General comments (skin integrity, edema, etc.): VSS with positional changes on RA; attempted to call sister but went straight to automated message with no ability to leave message, called friend on file who answered but was difficult to understand and did not seem aware of the situation, nursing reports they have been unable to get a hold of family and have not seen family either    Exercises     Assessment/Plan    PT Assessment Patient needs continued PT services  PT Problem List Decreased strength;Decreased balance;Decreased activity tolerance;Decreased coordination;Decreased mobility;Decreased cognition;Decreased knowledge of use of DME;Decreased safety awareness       PT Treatment Interventions DME instruction;Gait training;Stair training;Functional mobility training;Therapeutic activities;Therapeutic exercise;Balance training;Neuromuscular re-education;Cognitive remediation;Patient/family education    PT Goals (Current goals can be found in the Care Plan section)  Acute Rehab PT Goals Patient Stated Goal: to go home PT Goal Formulation: With patient Time For Goal Achievement: 07/30/23 Potential to Achieve Goals: Fair    Frequency Min 1X/week     Co-evaluation               AM-PAC PT "6 Clicks" Mobility  Outcome Measure Help needed turning from your back to your side while in a flat bed without using bedrails?: A Lot Help needed moving from lying on your back to sitting on the side of a flat bed without using bedrails?: A Lot Help needed moving to and from a bed to a chair  (including a wheelchair)?: A Lot Help needed standing up from a chair using your arms (e.g., wheelchair or bedside chair)?: A Lot Help needed to walk in hospital room?: Total Help needed climbing 3-5 steps with a railing? : Total 6 Click Score: 10    End of Session Equipment Utilized During Treatment: Gait belt Activity Tolerance: Patient tolerated treatment well Patient left: in bed;with call bell/phone within reach;with bed alarm set Nurse Communication: Mobility status PT Visit Diagnosis: Unsteadiness on feet (R26.81);Other abnormalities of gait and mobility (R26.89);Muscle weakness (generalized) (M62.81);Difficulty in walking, not elsewhere classified (R26.2)    Time: 7829-5621 PT Time Calculation (min) (ACUTE ONLY): 44 min   Charges:   PT Evaluation $PT Eval Moderate Complexity: 1 Mod PT Treatments $Therapeutic Activity: 23-37 mins PT General Charges $$ ACUTE PT VISIT: 1 Visit         Raymond Gurney, PT, DPT Acute Rehabilitation Services  Office: [phone]   Jewel Baize 07/16/2023, 5:19 PM

## 2023-07-16 NOTE — Plan of Care (Signed)
  Problem: Elimination: Goal: Will not experience complications related to bowel motility Outcome: Progressing Goal: Will not experience complications related to urinary retention Outcome: Progressing   Problem: Safety: Goal: Ability to remain free from injury will improve Outcome: Progressing   Problem: Skin Integrity: Goal: Risk for impaired skin integrity will decrease Outcome: Progressing   

## 2023-07-16 NOTE — Significant Event (Signed)
Patient was found on floor prone after CNA transferred him to the [facility]. Incontinent of black, loose stool. Transferred Calvin Jackson back to bed x 3 staff members, total assist needed. Patient denies syncope, denies pain, moves all extremities. V/s BP 103/63, HR 95, RR 18, SaO2 100%. Dr Natale Milch notified.

## 2023-07-16 NOTE — TOC Progression Note (Addendum)
Transition of Care [facility]) - Progression Note    Patient Details  Name: Calvin Jackson MRN: [id number] Date of Birth: Aug 09, 1961  Transition of Care [facility]) CM/SW Contact  Ronny Bacon, RN Phone Number: 07/16/2023, 3:02 PM  Clinical Narrative:   Patient was scheduled for discharge home today. Patient and staff unable to reach sister for ride home. Phone call attempt made to call sister and home using phone numbers on chart. Phone numbers do not appear to be in service. Call to patient room, floor RN reported that patient is confused, weak and had a fall. Attending made aware, awaiting eval from PT.  1620: Call to [facility],Friend listed as contact. Per [name] phone number he has for sister is [phone]. Phone call attempt made but phone is not in service. [name] plans to try to get to patient house to talk to sister or have someone else go to house to talk to sister. Asked that he have the sister call the hospital to discuss patient discharge. Charge RN made aware.          Expected Discharge Plan and Services         Expected Discharge Date: 07/16/23                                     Social Determinants of Health (SDOH) Interventions SDOH Screenings   Food Insecurity: No Food Insecurity (07/15/2023)  Housing: Low Risk  (07/15/2023)  Transportation Needs: No Transportation Needs (07/15/2023)  Utilities: Not At Risk (07/15/2023)  Tobacco Use: High Risk (07/14/2023)    Readmission Risk Interventions     No data to display

## 2023-07-17 DIAGNOSIS — K5900 Constipation, unspecified: Secondary | ICD-10-CM | POA: Diagnosis not present

## 2023-07-17 LAB — GLUCOSE, CAPILLARY
Glucose-Capillary: 104 mg/dL — ABNORMAL HIGH (ref 70–99)
Glucose-Capillary: 127 mg/dL — ABNORMAL HIGH (ref 70–99)
Glucose-Capillary: 108 mg/dL — ABNORMAL HIGH (ref 70–99)
Glucose-Capillary: 137 mg/dL — ABNORMAL HIGH (ref 70–99)

## 2023-07-17 MED ORDER — ATORVASTATIN CALCIUM 10 MG PO TABS
20.0000 mg | ORAL_TABLET | Freq: Every day | ORAL | Status: DC
  Administered 2023-07-17 – 2023-07-19 (×3): 20 mg via ORAL
  Filled 2023-07-17 (×3): qty 2

## 2023-07-17 MED ORDER — POLYETHYLENE GLYCOL 3350 17 G PO PACK
17.0000 g | PACK | Freq: Three times a day (TID) | ORAL | Status: DC
  Administered 2023-07-17 – 2023-07-19 (×6): 17 g via ORAL
  Filled 2023-07-17 (×5): qty 1

## 2023-07-17 MED ORDER — SENNOSIDES-DOCUSATE SODIUM 8.6-50 MG PO TABS
2.0000 | ORAL_TABLET | Freq: Two times a day (BID) | ORAL | Status: DC
  Administered 2023-07-17 – 2023-07-19 (×5): 2 via ORAL
  Filled 2023-07-17 (×5): qty 2

## 2023-07-17 NOTE — NC FL2 (Signed)
[place] MEDICAID FL2 LEVEL OF CARE FORM     IDENTIFICATION  Patient Name: Calvin Jackson Birthdate: 1961/08/24 Sex: male Admission Date (Current Location): 07/14/2023  [facility] and [place] Number:  Producer, television/film/video and Address:  The [place], [address] [place], [place] [address]      Provider Number: [id number]  Attending Physician Name and Address:  Azucena Fallen, MD  Relative Name and Phone Number:  Sammuel Cooper (Sister)  [phone]    Current Level of Care: Hospital Recommended Level of Care: Skilled Nursing Facility Prior Approval Number:    Date Approved/Denied:   PASRR Number: [id number]  Discharge Plan: SNF    Current Diagnoses: Patient Active Problem List   Diagnosis Date Noted   Obstipation 07/15/2023   Cord compression [facility])    Acute CVA (cerebrovascular accident) (HCC) 10/15/2020   Marijuana abuse 10/15/2020   Tobacco dependence 10/15/2020   Essential hypertension    Dyslipidemia    Diabetes mellitus with neurological manifestation (HCC)     Orientation RESPIRATION BLADDER Height & Weight     Self, Time, Situation, Place  Normal Incontinent Weight: 162 lb 0.6 oz (73.5 kg) Height:  5\' 10"  (177.8 cm)  BEHAVIORAL SYMPTOMS/MOOD NEUROLOGICAL BOWEL NUTRITION STATUS      Incontinent Diet (see d/c summary)  AMBULATORY STATUS COMMUNICATION OF NEEDS Skin   Limited Assist Verbally Normal                       Personal Care Assistance Level of Assistance  Bathing, Feeding, Dressing   Feeding assistance: Independent Dressing Assistance: Limited assistance     Functional Limitations Info  Speech, Hearing, Sight Sight Info: Adequate Hearing Info: Adequate Speech Info: Adequate    SPECIAL CARE FACTORS FREQUENCY  OT (By licensed OT), PT (By licensed PT)     PT Frequency: 5x/ week OT Frequency: 5x/ week            Contractures Contractures Info: Not present    Additional Factors Info  Code  Status, Allergies, Insulin Sliding Scale Code Status Info: FULL Allergies Info: No Known Allergies   Insulin Sliding Scale Info: see d/c med list       Current Medications (07/17/2023):  This is the current hospital active medication list Current Facility-Administered Medications  Medication Dose Route Frequency Provider Last Rate Last Admin   atorvastatin (LIPITOR) tablet 20 mg  20 mg Oral Daily Azucena Fallen, MD   20 mg at 07/17/23 0855   enoxaparin (LOVENOX) injection 40 mg  40 mg Subcutaneous Q24H Russella Dar, NP   40 mg at 07/17/23 1158   hydrALAZINE (APRESOLINE) injection 10 mg  10 mg Intravenous Q6H PRN Russella Dar, NP       insulin aspart (novoLOG) injection 0-5 Units  0-5 Units Subcutaneous QHS Russella Dar, NP       insulin aspart (novoLOG) injection 0-9 Units  0-9 Units Subcutaneous TID WC Russella Dar, NP   1 Units at 07/17/23 1159   losartan (COZAAR) tablet 25 mg  25 mg Oral Daily Russella Dar, NP   25 mg at 07/17/23 0855   polyethylene glycol (MIRALAX / GLYCOLAX) packet 17 g  17 g Oral Q8H Azucena Fallen, MD   17 g at 07/17/23 1251   senna-docusate (Senokot-S) tablet 2 tablet  2 tablet Oral BID Azucena Fallen, MD   2 tablet at 07/17/23 1251     Discharge Medications: Please see  discharge summary for a list of discharge medications.  Relevant Imaging Results:  Relevant Lab Results:   Additional Information SSN: [id number];   F , LCSW

## 2023-07-17 NOTE — Evaluation (Signed)
Occupational Therapy Evaluation Patient Details Name: Calvin Jackson MRN: [id number] DOB: 16-Jun-1961 Today's Date: 07/17/2023   History of Present Illness Pt is a 62 y.o. male who presented 07/14/23 with abdominal pain and swelling x2 days.  Imaging revealed very dilated rectum and sigmoid colon full of stool. Pt admitted with obstipation. Pt also had unwitnessed fall earlier today, 8/11. 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 Pt presenting with decreased safety awareness and needs Min to Mod A to help with bADLs. He needs Min A with proper cueing to stand and ambulates with CGA, R foot drop but has an AFO. Pt would benefit from continued acute skilled OT services to address deficits and help transition to next level of care. Patient would benefit from post acute skilled rehab facility with <3 hours of therapy and 24/7 support, could DC home with family able to provide the level of support needed.       If plan is discharge home, recommend the following: A little help with walking and/or transfers;Assistance with cooking/housework;A little help with bathing/dressing/bathroom;Assist for transportation;Help with stairs or ramp for entrance;Supervision due to cognitive status    Functional Status Assessment  Patient has had a recent decline in their functional status and demonstrates the ability to make significant improvements in function in a reasonable and predictable amount of time.  Equipment Recommendations  None recommended by OT (Pt has rec DME)    Recommendations for Other Services       Precautions / Restrictions Precautions Precautions: Fall;Other (comment) Precaution Comments: incontinent Required Braces or Orthoses: Other Brace Other Brace: R AFO Restrictions Weight Bearing Restrictions: No      Mobility Bed  Mobility Overal bed mobility: Needs Assistance Bed Mobility: Supine to Sit, Sit to Supine     Supine to sit: Min assist, HOB elevated, Used rails     General bed mobility comments: Pt left sitting in recliner, intermittently posterior lean but able to pull self up using bed rails    Transfers Overall transfer level: Needs assistance Equipment used: Rolling walker (2 wheels) Transfers: Sit to/from Stand Sit to Stand: Min assist           General transfer comment: Initially Min A with cues for anterior lean and hand placement for push off. Mod A when patient began to slide towards EOB too far and needed OT assist to prevent fall      Balance Overall balance assessment: Needs assistance Sitting-balance support: Single extremity supported, Bilateral upper extremity supported, Feet supported Sitting balance-Leahy Scale: Fair Sitting balance - Comments: Intermittent posterior lean, correctable with cues   Standing balance support: Bilateral upper extremity supported, During functional activity, Reliant on assistive device for balance Standing balance-Leahy Scale: Poor Standing balance comment: Reliant on RW                           ADL either performed or assessed with clinical judgement   ADL Overall ADL's : Needs assistance/impaired Eating/Feeding: Independent;Sitting   Grooming: Contact guard assist;Standing   Upper Body Bathing: Sitting;Set up   Lower Body Bathing: Sitting/lateral leans;Minimal assistance   Upper Body Dressing : Minimal assistance;Sitting   Lower Body Dressing: Sitting/lateral leans;Moderate assistance Lower Body Dressing Details (indicate cue type and reason): donning briefs Toilet Transfer: Rolling walker (2 wheels);Ambulation;Contact guard  assist   Toileting- Clothing Manipulation and Hygiene: Sit to/from stand;Minimal assistance       Functional mobility during ADLs: Rolling walker (2 wheels);Contact guard assist       Vision          Perception         Praxis         Pertinent Vitals/Pain Pain Assessment Pain Assessment: No/denies pain     Extremity/Trunk Assessment Upper Extremity Assessment Upper Extremity Assessment: RUE deficits/detail RUE Deficits / Details: R weaker than L, decreased FMC and slow GMC.   Lower Extremity Assessment RLE Deficits / Details: R weaker than L, per chart pt has residual R sided weakness from a prior CVA   Cervical / Trunk Assessment Cervical / Trunk Assessment: Kyphotic   Communication     Cognition Arousal: Alert Behavior During Therapy: Flat affect Overall Cognitive Status: No family/caregiver present to determine baseline cognitive functioning                           Safety/Judgement: Decreased awareness of safety, Decreased awareness of deficits   Problem Solving: Slow processing, Requires tactile cues, Requires verbal cues General Comments: Pt noted to not make the best safety decisions, during STS pt noted he felt like his leg was going to give out but continue to slide forward towards EOB, OT provided assist to prvent pt from sliding on floor     General Comments  VSS, attempted to call sister but not able to reach.    Exercises     Shoulder Instructions      Home Living Family/patient expects to be discharged to:: Private residence Living Arrangements: Other relatives (sister) Available Help at Discharge: Family;Available 24 hours/day;Personal care attendant Type of Home: House Home Access: Stairs to enter [employer] of Steps: 3 Entrance Stairs-Rails: Can reach both Home Layout: One level     Bathroom Shower/Tub: Chief Strategy Officer: Standard     Home Equipment: Agricultural consultant (2 wheels);Cane - single point;BSC/3in1;Shower seat   Additional Comments: Aide comes in mornings      Prior Functioning/Environment Prior Level of Function : Needs assist;Patient poor historian/Family not available              Mobility Comments: Reports using RW vs cane for mod I functional mobility ADLs Comments: Aide comes in mornings to assist with ADLs (dressing/bathing/driving to appointments)        OT Problem List: Impaired balance (sitting and/or standing);Decreased coordination;Decreased safety awareness      OT Treatment/Interventions: Self-care/ADL training;Balance training;Therapeutic exercise;Patient/family education    OT Goals(Current goals can be found in the care plan section) Acute Rehab OT Goals Patient Stated Goal: none stated OT Goal Formulation: With patient Time For Goal Achievement: 07/31/23 Potential to Achieve Goals: Good ADL Goals Pt Will Perform Grooming: with supervision;standing Pt Will Perform Lower Body Dressing: sitting/lateral leans;with set-up;with supervision Pt Will Transfer to Toilet: with supervision;ambulating Additional ADL Goal #1: Pt will demonstrate improved safety awareness 75% of time within functional activity  OT Frequency: Min 1X/week    Co-evaluation              AM-PAC OT "6 Clicks" Daily Activity     Outcome Measure Help from another person eating meals?: None Help from another person taking care of personal grooming?: A Little Help from another person toileting, which includes using toliet, bedpan, or urinal?: A Little Help from another person bathing (including washing,  rinsing, drying)?: A Little Help from another person to put on and taking off regular upper body clothing?: A Little Help from another person to put on and taking off regular lower body clothing?: A Lot 6 Click Score: 18   End of Session Equipment Utilized During Treatment: Gait belt;Rolling walker (2 wheels) Nurse Communication: Mobility status  Activity Tolerance: Patient tolerated treatment well Patient left: in chair;with call bell/phone within reach;with chair alarm set  OT Visit Diagnosis: Unsteadiness on feet (R26.81);Other abnormalities of gait and  mobility (R26.89);History of falling (Z91.81)                Time: 1610-9604 OT Time Calculation (min): 25 min Charges:  OT General Charges $OT Visit: 1 Visit OT Evaluation $OT Eval Moderate Complexity: 1 Mod OT Treatments $Therapeutic Activity: 8-22 mins  07/17/2023  AB, OTR/L  Acute Rehabilitation Services  Office: [phone]   Tristan Schroeder 07/17/2023, 10:43 AM

## 2023-07-17 NOTE — Progress Notes (Signed)
PROGRESS NOTE    Calvin Jackson  OZH:[id number] DOB: [birth year] DOA: 07/14/2023 PCP: [facility]   Brief Narrative:  Calvin Jackson is a 62 y.o. male with medical history significant of prior CVA, hypertension, dyslipidemia, diabetes mellitus.  Patient presented to the ER complaining of abdominal pain and swelling for 2 days.   Patient had multiple bowel movements overnight with supportive care, feels markedly improved.  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  Patient improved on MiraLAX and Senokot alone, will continue this regimen at discharge.  Recommend patient follow-up with [facility] at [facility] this [facility] as well as discussed possible need for GI consult.  Patient remains medically stable for discharge, disposition plan to discharge home has been canceled and family is now requesting discharge to SNF.  Assessment & Plan:   Principal Problem:   Obstipation  Acute on chronic constipation/obstipation with abdominal distention but no bowel obstruction GI following Continues to pass flatus and stool Dark stool noted x1, H/H stable - no overt signs/symptoms of bleeding (unclear if patient has been taking maalox/similar) CT 2022 shows very similar appearance - likely chronic (?chronic dysmotility disorder) Patient denies abdominal pain, bowel sounds are present although hypoactive.     Diabetes mellitus 2 Unclear if diet controlled or on medications Check hemoglobin A1c Supplement with sliding scale insulin   History of CVA Chronic ambulatory dysfunction -Reports he has appointed legal guardian(s - 9 of them?) -No documentation located in chart but next point of contact is his sister who is not returning phone calls -Continue statin -  should likely be on aspirin/plavix at baseline but none listed on chart - possible intolerance? -Walker at baseline - continues to be weak with R sided deficits (mechanical fall from bed side commode 8/11 without injury)   Hypertension, well controlled Continue current regimen   Chronic kidney disease stage IIIa Baseline creatinine has ranged between 1.18 and 1.36 Current creatinine is 1.42 with a GFR of 55 Follow creatinine on clear liquids and if necessary supplement with gentle IV fluid hydration   Dyslipidemia Continue statin/low fat diet  DVT prophylaxis: enoxaparin (LOVENOX) injection 40 mg Start: 07/15/23 1100 Code Status:   Code Status: Full Code  Status is: Observation  Dispo: The patient is from: Home              Anticipated d/c is to: SNF              Anticipated d/c date is: Imminent              Patient currently is medically stable for discharge  Consultants:  None  Procedures:  None  Antimicrobials:  None  Subjective: No acute issues or events overnight  Objective: Vitals:   07/16/23 1552 07/16/23 1554 07/16/23 2115 07/17/23 0448  BP: 123/81 123/81 109/79 107/64  Pulse: 93 93 89 73  Resp:   18 19  Temp: 98.6 F (37 C)  98.4 F (36.9 C) 98.1 F (36.7 C)  TempSrc: Oral     SpO2: 98% 98% 96% 99%  Weight:      Height:        Intake/Output Summary (Last 24 hours) at 07/17/2023 0744 Last data filed at 07/16/2023 1648 Gross per 24 hour  Intake 240 ml  Output 0 ml  Net 240 ml  Filed Weights   07/14/23 1831 07/15/23 1048  Weight: 73.5 kg 73.5 kg    Examination:  General: Pt is alert, awake, not in acute distress Cardiovascular: RRR, S1/S2 +, no rubs, no gallops Respiratory: CTA bilaterally, no wheezing, no rhonchi Abdominal: Soft, NT, minimally distended, distant bowel sounds Extremities: no edema, no cyanosis  Data Reviewed: I have personally reviewed following labs and imaging studies  CBC: Recent Labs  Lab 07/14/23 1841  07/15/23 1110 07/16/23 0442 07/16/23 1357  WBC 4.4 8.3 11.8*  --   HGB 15.4 16.0 14.9 14.1  HCT 43.5 44.9 43.1 40.3  MCV 76.3* 75.7* 76.3*  --   PLT 199 178 181  --    Basic Metabolic Panel: Recent Labs  Lab 07/14/23 1841 07/15/23 1110 07/16/23 0442  NA 139  --  136  K 3.9  --  4.1  CL 102  --  101  CO2 22  --  22  GLUCOSE 187*  --  173*  BUN 30*  --  35*  CREATININE 1.42* 1.28* 1.40*  CALCIUM 9.0  --  8.5*   GFR: Estimated Creatinine Clearance: 55 mL/min (A) (by C-G formula based on SCr of 1.4 mg/dL (H)). Liver Function Tests: Recent Labs  Lab 07/14/23 1841  AST 23  ALT 22  ALKPHOS 51  BILITOT 0.9  PROT 7.2  ALBUMIN 3.7   Recent Labs  Lab 07/14/23 1841  LIPASE 30   No results for input(s): "AMMONIA" in the last 168 hours. Coagulation Profile: No results for input(s): "INR", "PROTIME" in the last 168 hours. Cardiac Enzymes: No results for input(s): "CKTOTAL", "CKMB", "CKMBINDEX", "TROPONINI" in the last 168 hours. BNP (last 3 results) No results for input(s): "PROBNP" in the last 8760 hours. HbA1C: Recent Labs    07/15/23 1110  HGBA1C 5.7*   CBG: Recent Labs  Lab 07/16/23 0754 07/16/23 1156 07/16/23 1742 07/16/23 2201 07/17/23 0738  GLUCAP 193* 160* 110* 124* 104*   Lipid Profile: Recent Labs    07/15/23 1110  CHOL 138  HDL 69  LDLCALC 62  TRIG 35  CHOLHDL 2.0   Thyroid Function Tests: No results for input(s): "TSH", "T4TOTAL", "FREET4", "T3FREE", "THYROIDAB" in the last 72 hours. Anemia Panel: No results for input(s): "VITAMINB12", "FOLATE", "FERRITIN", "TIBC", "IRON", "RETICCTPCT" in the last 72 hours. Sepsis Labs: No results for input(s): "PROCALCITON", "LATICACIDVEN" in the last 168 hours.  No results found for this or any previous visit (from the past 240 hour(s)).       Radiology Studies: DG Abd Portable 1V  Result Date: 07/16/2023 CLINICAL DATA:  98749 Ileus (HCC) 98749 EXAM: PORTABLE ABDOMEN - 1 VIEW COMPARISON:   07/15/2023 abdominal radiograph FINDINGS: No disproportionately dilated small bowel loops. Prominent diffuse colonic gaseous distension, most prominent in the sigmoid colon, improved, sigmoid diameter 15.5 cm, previously 18.0 cm. Large reduction in colonic stool burden. No evidence of pneumatosis or pneumoperitoneum. IMPRESSION: Prominent diffuse colonic gaseous distension, improved, most prominent in the sigmoid colon, compatible with improving ileus. Large reduction in colonic stool burden. Electronically Signed   By: Delbert Phenix M.D.   On: 07/16/2023 11:12   DG Abd 2 Views  Result Date: 07/15/2023 CLINICAL DATA:  Constipatio constipation n bike EXAM: ABDOMEN - 2 VIEW COMPARISON:  CT 07/14/2023, 09/24/2021 FINDINGS: Large stool burden in the rectosigmoid colon as seen on comparison CT 1 day prior. Rectosigmoid colon distended to 13 cm. Stool burden extends into the mid abdomen. gas-filled loops of LEFT and RIGHT colon identified which are  normal caliber. No intraperitoneal free air. No small bowel dilatation. IMPRESSION: Dramatically large stool burden within the rectosigmoid colon unchanged from CT 1 day prior. Findings similar to but advanced from more remote CT 09/24/2021. Electronically Signed   By: Genevive Bi M.D.   On: 07/15/2023 11:34        Scheduled Meds:  enoxaparin (LOVENOX) injection  40 mg Subcutaneous Q24H   insulin aspart  0-5 Units Subcutaneous QHS   insulin aspart  0-9 Units Subcutaneous TID WC   losartan  25 mg Oral Daily   Continuous Infusions:   LOS: 0 days   Time spent:  Azucena Fallen, DO [facility]  If 7PM-7AM, please contact night-coverage [email or web address]  07/17/2023, 7:44 AM

## 2023-07-17 NOTE — Progress Notes (Signed)
Physical Therapy Treatment Patient Details Name: Calvin Jackson MRN: [id number] DOB: [birth year] Today's Date: 07/17/2023   History of Present Illness Pt is a 62 y.o. male who presented 07/14/23 with abdominal pain and swelling x2 days.  Imaging revealed very dilated rectum and sigmoid colon full of stool. Pt admitted with obstipation. Pt also had unwitnessed fall earlier today, 8/11. 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 Pt reports he feels he has been getting progressively weaker since more recent CVA. PT notes deficits in BLE strength, with bilateral foot drag. Pt often hits R foot into L foot during swing phase, and has a constant anterior lean when ambulating which greatly increases his risk for falls. PT continues to recommend short term inpatient PT placement at this time due to high risk for falls. If pt has assistance at home for all out of bed mobility discharge home with HHPT may be a viable option.   If plan is discharge home, recommend the following: Two people to help with walking and/or transfers;A lot of help with bathing/dressing/bathroom;Assistance with cooking/housework;Direct supervision/assist for medications management;Direct supervision/assist for financial management;Assist for transportation;Help with stairs or ramp for entrance   Can travel by private vehicle     No  Equipment Recommendations  Rolling walker (2 wheels);BSC/3in1;Wheelchair (measurements PT);Wheelchair cushion (measurements PT)    Recommendations for Other Services       Precautions / Restrictions Precautions Precautions: Fall;Other (comment) Precaution Comments: incontinent Required Braces or Orthoses: Other Brace Other Brace: R AFO Restrictions Weight Bearing Restrictions: No     Mobility  Bed Mobility                    Transfers Overall transfer  level: Needs assistance Equipment used: Rolling walker (2 wheels) Transfers: Sit to/from Stand Sit to Stand: Contact guard assist           General transfer comment: verbal cues for hand placement and increased trunk flexion    Ambulation/Gait Ambulation/Gait assistance: Min assist Gait Distance (Feet): 70 Feet Assistive device: Rolling walker (2 wheels) Gait Pattern/deviations: Step-through pattern, Decreased dorsiflexion - left, Decreased dorsiflexion - right, Trunk flexed Gait velocity: reduced Gait velocity interpretation: <1.31 ft/sec, indicative of household ambulator   General Gait Details: pt with slowed step-through gait, pt with bilateral foot drag. Pt compensates by this with increased anterior trunk lean allowing for greater hip extension to increase muscle tension and hip flexor strength. PT notes the pt intermittently clips L foot with R foot during swing phase of RLE.   Stairs             Wheelchair Mobility     Tilt Bed    Modified Rankin (Stroke Patients Only)       Balance Overall balance assessment: Needs assistance Sitting-balance support: No upper extremity supported, Feet supported Sitting balance-Leahy Scale: Fair     Standing balance support: Bilateral upper extremity supported Standing balance-Leahy Scale: Poor Standing balance comment: Reliant on RW                            Cognition Arousal: Alert Behavior During Therapy: Flat affect Overall Cognitive Status: No family/caregiver present to determine baseline cognitive functioning  Safety/Judgement: Decreased awareness of safety Awareness: Emergent Problem Solving: Slow processing          Exercises      General Comments General comments (skin integrity, edema, etc.): VSS on RA      Pertinent Vitals/Pain Pain Assessment Pain Assessment: No/denies pain    Home Living Family/patient expects to be discharged to::  Private residence Living Arrangements: Other relatives (sister) Available Help at Discharge: Family;Available 24 hours/day;Personal care attendant Type of Home: House Home Access: Stairs to enter Entrance Stairs-Rails: Can reach both Entrance Stairs-Number of Steps: 3   Home Layout: One level Home Equipment: Agricultural consultant (2 wheels);Cane - single point;BSC/3in1;Shower seat Additional Comments: Aide comes in mornings    Prior Function            PT Goals (current goals can now be found in the care plan section) Acute Rehab PT Goals Patient Stated Goal: to go home Progress towards PT goals: Progressing toward goals    Frequency    Min 1X/week      PT Plan      Co-evaluation              AM-PAC PT "6 Clicks" Mobility   Outcome Measure  Help needed turning from your back to your side while in a flat bed without using bedrails?: A Little Help needed moving from lying on your back to sitting on the side of a flat bed without using bedrails?: A Little Help needed moving to and from a bed to a chair (including a wheelchair)?: A Little Help needed standing up from a chair using your arms (e.g., wheelchair or bedside chair)?: A Little Help needed to walk in hospital room?: A Little Help needed climbing 3-5 steps with a railing? : Total 6 Click Score: 16    End of Session Equipment Utilized During Treatment: Gait belt Activity Tolerance: Patient tolerated treatment well Patient left: in chair;with call bell/phone within reach;with chair alarm set Nurse Communication: Mobility status PT Visit Diagnosis: Unsteadiness on feet (R26.81);Other abnormalities of gait and mobility (R26.89);Muscle weakness (generalized) (M62.81);Difficulty in walking, not elsewhere classified (R26.2)     Time: 8295-6213 PT Time Calculation (min) (ACUTE ONLY): 24 min  Charges:    $Gait Training: 8-22 mins $Therapeutic Activity: 8-22 mins PT General Charges $$ ACUTE PT VISIT: 1 Visit                      Arlyss Gandy, PT, DPT Acute Rehabilitation Office [phone]    Arlyss Gandy 07/17/2023, 11:07 AM

## 2023-07-17 NOTE — TOC Progression Note (Addendum)
Transition of Care [facility]) - Initial/Assessment Note    Patient Details  Name: Calvin Jackson MRN: [id number] Date of Birth: 05-Mar-1961  Transition of Care [facility]) CM/SW Contact:    Ralene Bathe, LCSW Phone Number: 07/17/2023, 10:21 AM  Clinical Narrative:                 LCSW received consult for SNF placement and spoke with patient.  The patient is agreeable to SNF if [facility] approves.  LCSW contacted [facility] and LM for Irving Burton, Systems developer.    Addendum 12:45-  [facility] approved SNF placement.  Pending bed offers.   TOC following.        Patient Goals and CMS Choice            Expected Discharge Plan and Services         Expected Discharge Date: 07/16/23                                    Prior Living Arrangements/Services                       Activities of Daily Living Home Assistive Devices/Equipment: Bedside commode/3-in-1, Built-in shower seat, Cane (specify quad or straight), Dentures (specify type), Walker (specify type) ADL Screening (condition at time of admission) Patient's cognitive ability adequate to safely complete daily activities?: Yes Is the patient deaf or have difficulty hearing?: No Does the patient have difficulty seeing, even when wearing glasses/contacts?: Yes Does the patient have difficulty concentrating, remembering, or making decisions?: No Patient able to express need for assistance with ADLs?: Yes Does the patient have difficulty dressing or bathing?: No Independently performs ADLs?: Yes (appropriate for developmental age) Does the patient have difficulty walking or climbing stairs?: Yes Weakness of Legs: Both Weakness of Arms/Hands: Right  Permission Sought/Granted                  Emotional Assessment              Admission diagnosis:  Ileus (HCC) [K56.7] Obstipation [K59.00] Patient Active Problem List   Diagnosis Date Noted   Obstipation 07/15/2023   Cord compression [facility])     Acute CVA (cerebrovascular accident) (HCC) 10/15/2020   Marijuana abuse 10/15/2020   Tobacco dependence 10/15/2020   Essential hypertension    Dyslipidemia    Diabetes mellitus with neurological manifestation (HCC)    PCP:  [facility]:   [facility] - [place], [place] - [address] [place] [address] Phone: [phone] Fax: [phone]  [id number] - [place], [place] - [address] AT [facility] [address] [place] [address] Phone: [phone] Fax: [phone]  [id number] [place], [place] - [address] AT [facility] OF [address] [place] [address] Phone: [phone] Fax: [phone]     Social Determinants of Health (SDOH) Social History: SDOH Screenings   Food Insecurity: No Food Insecurity (07/15/2023)  Housing: Low Risk  (07/15/2023)  Transportation Needs: No Transportation Needs (07/15/2023)  Utilities: Not At Risk (07/15/2023)  Tobacco Use: High Risk (07/14/2023)   SDOH Interventions:     Readmission Risk Interventions     No data to display

## 2023-07-18 DIAGNOSIS — K5900 Constipation, unspecified: Secondary | ICD-10-CM | POA: Diagnosis not present

## 2023-07-18 LAB — GLUCOSE, CAPILLARY
Glucose-Capillary: 137 mg/dL — ABNORMAL HIGH (ref 70–99)
Glucose-Capillary: 97 mg/dL (ref 70–99)
Glucose-Capillary: 102 mg/dL — ABNORMAL HIGH (ref 70–99)
Glucose-Capillary: 158 mg/dL — ABNORMAL HIGH (ref 70–99)

## 2023-07-18 NOTE — TOC Progression Note (Addendum)
Transition of Care [facility]) - Initial/Assessment Note    Patient Details  Name: Calvin Jackson MRN: [id number] Date of Birth: 10-17-1961  Transition of Care [facility]) CM/SW Contact:    Ralene Bathe, LCSW Phone Number: 07/18/2023, 1:31 PM  Clinical Narrative:                 LCSW contacted [facility] as this is the family's facility of choice and LM requesting a returned call.  LCSW contacted [facility] social worker, Colin Mulders [phone]), and provided update.  [facility] will transport once SNF accepts.    Addendum 14:00:  LCSW received a returned call from [place] with [facility].  The facility is unable to accept the patient.  LCSW spoke with the patient and patient is agreeable with discharging to [place].  LCSW contacted Chrystal with [facility].  The facility can accept the patient tomorrow.   TOC following.   TOC following.         Patient Goals and CMS Choice            Expected Discharge Plan and Services         Expected Discharge Date: 07/16/23                                    Prior Living Arrangements/Services                       Activities of Daily Living Home Assistive Devices/Equipment: Bedside commode/3-in-1, Built-in shower seat, Cane (specify quad or straight), Dentures (specify type), Walker (specify type) ADL Screening (condition at time of admission) Patient's cognitive ability adequate to safely complete daily activities?: Yes Is the patient deaf or have difficulty hearing?: No Does the patient have difficulty seeing, even when wearing glasses/contacts?: Yes Does the patient have difficulty concentrating, remembering, or making decisions?: No Patient able to express need for assistance with ADLs?: Yes Does the patient have difficulty dressing or bathing?: No Independently performs ADLs?: Yes (appropriate for developmental age) Does the patient have difficulty walking or climbing stairs?: Yes Weakness of Legs: Both Weakness of  Arms/Hands: Right  Permission Sought/Granted                  Emotional Assessment              Admission diagnosis:  Ileus (HCC) [K56.7] Obstipation [K59.00] Patient Active Problem List   Diagnosis Date Noted   Obstipation 07/15/2023   Cord compression [facility])    Acute CVA (cerebrovascular accident) (HCC) 10/15/2020   Marijuana abuse 10/15/2020   Tobacco dependence 10/15/2020   Essential hypertension    Dyslipidemia    Diabetes mellitus with neurological manifestation (HCC)    PCP:  [facility]:   [facility] - [place], [place] - [address] [place] [address] Phone: [phone] Fax: [phone]  [id number] [place], [place] - [address] AT [facility] [address] [place] [address] Phone: [phone] Fax: [phone]  [id number] [place], [place] - [address] AT [facility] OF [address] [place] [address] Phone: [phone] Fax: [phone]     Social Determinants of Health (SDOH) Social History: SDOH Screenings   Food Insecurity: No Food Insecurity (07/15/2023)  Housing: Low Risk  (07/15/2023)  Transportation Needs:  No Transportation Needs (07/15/2023)  Utilities: Not At Risk (07/15/2023)  Tobacco Use: High Risk (07/14/2023)   SDOH Interventions:     Readmission Risk Interventions     No data to display

## 2023-07-18 NOTE — Progress Notes (Signed)
PROGRESS NOTE    Calvin Jackson  AVW:[id number] DOB: 1960/12/07 DOA: 07/14/2023 [facility]CP: [facility]   Brief Narrative:  Calvin Jackson is a 62 y.o. male with medical history significant of prior CVA, hypertension, dyslipidemia, diabetes mellitus.  Patient presented to the ER complaining of abdominal pain and swelling for 2 days.   Patient had multiple bowel movements overnight with supportive care, feels markedly improved.  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  Patient improved on MiraLAX and Senokot alone, will continue this regimen at discharge.  Recommend [facility] at [facility] as well as discussed possible need for GI consult.  Patient remains medically stable for discharge, disposition plan to discharge home has been canceled and family is now requesting discharge to SNF.  Assessment & Plan:   Principal Problem:   Obstipation  Acute on chronic constipation/obstipation with abdominal distention but no bowel obstruction GI following Continues to pass flatus and stool Dark stool noted x1, H/H stable - no overt signs/symptoms of bleeding (unclear if patient has been taking maalox/similar) CT 2022 shows very similar appearance - likely chronic (?chronic dysmotility disorder) Patient denies abdominal pain, bowel sounds are present although hypoactive.     Diabetes mellitus 2 Unclear if diet controlled or on medications Check hemoglobin A1c Supplement with sliding scale insulin   History of CVA Chronic ambulatory dysfunction -Reports he has appointed legal guardian(s - 9 of them?) -No documentation located in chart but next point of contact is his sister who is not returning phone calls -Continue statin -  should likely be on aspirin/plavix at baseline but none listed on chart - possible intolerance? -Walker at baseline - continues to be weak with R sided deficits (mechanical fall from bed side commode 8/11 without injury)   Hypertension, well controlled Continue current regimen   Chronic kidney disease stage IIIa Baseline creatinine has ranged between 1.18 and 1.36 Current creatinine is 1.42 with a GFR of 55 Follow creatinine on clear liquids and if necessary supplement with gentle IV fluid hydration   Dyslipidemia Continue statin/low fat diet  DVT prophylaxis: enoxaparin (LOVENOX) injection 40 mg Start: 07/15/23 1100 Code Status:   Code Status: Full Code  Status is: Observation  Dispo: The patient is from: Home              Anticipated d/c is to: SNF              Anticipated d/c date is: Imminent              Patient currently is medically stable for discharge  Consultants:  None  Procedures:  None  Antimicrobials:  None  Subjective: No acute issues or events overnight  Objective: Vitals:   07/17/23 0448 07/17/23 1653 07/17/23 2128 07/18/23 0451  BP: 107/64 103/66 104/73 115/72  Pulse: 73 77 70 67  Resp: 19 18 18    Temp: 98.1 F (36.7 C) 97.6 F (36.4 C) 97.9 F (36.6 C) (!) 97.4 F (36.3 C)  TempSrc:   Oral   SpO2: 99% 100% 100% 100%  Weight:      Height:        Intake/Output Summary (Last 24 hours) at 07/18/2023 0657 Last data filed at 07/18/2023 0540 Gross per 24 hour  Intake 1080 ml  Output 0 ml  Net 1080 ml   Filed Weights   07/14/23 1831 07/15/23 1048  Weight: 73.5 kg 73.5 kg    Examination:  General: Pt is alert, awake, not in acute distress Cardiovascular: RRR, S1/S2 +, no rubs, no gallops Respiratory: CTA bilaterally, no wheezing, no rhonchi Abdominal: Soft, NT, minimally distended, distant bowel sounds Extremities: no edema, no cyanosis  Data Reviewed: I have personally reviewed following labs and imaging studies  CBC: Recent Labs   Lab 07/14/23 1841 07/15/23 1110 07/16/23 0442 07/16/23 1357  WBC 4.4 8.3 11.8*  --   HGB 15.4 16.0 14.9 14.1  HCT 43.5 44.9 43.1 40.3  MCV 76.3* 75.7* 76.3*  --   PLT 199 178 181  --    Basic Metabolic Panel: Recent Labs  Lab 07/14/23 1841 07/15/23 1110 07/16/23 0442  NA 139  --  136  K 3.9  --  4.1  CL 102  --  101  CO2 22  --  22  GLUCOSE 187*  --  173*  BUN 30*  --  35*  CREATININE 1.42* 1.28* 1.40*  CALCIUM 9.0  --  8.5*   GFR: Estimated Creatinine Clearance: 55 mL/min (A) (by C-G formula based on SCr of 1.4 mg/dL (H)). Liver Function Tests: Recent Labs  Lab 07/14/23 1841  AST 23  ALT 22  ALKPHOS 51  BILITOT 0.9  PROT 7.2  ALBUMIN 3.7   Recent Labs  Lab 07/14/23 1841  LIPASE 30   No results for input(s): "AMMONIA" in the last 168 hours. Coagulation Profile: No results for input(s): "INR", "PROTIME" in the last 168 hours. Cardiac Enzymes: No results for input(s): "CKTOTAL", "CKMB", "CKMBINDEX", "TROPONINI" in the last 168 hours. BNP (last 3 results) No results for input(s): "PROBNP" in the last 8760 hours. HbA1C: Recent Labs    07/15/23 1110  HGBA1C 5.7*   CBG: Recent Labs  Lab 07/16/23 2201 07/17/23 0738 07/17/23 1124 07/17/23 1655 07/17/23 2127  GLUCAP 124* 104* 127* 108* 137*   Lipid Profile: Recent Labs    07/15/23 1110  CHOL 138  HDL 69  LDLCALC 62  TRIG 35  CHOLHDL 2.0   Thyroid Function Tests: No results for input(s): "TSH", "T4TOTAL", "FREET4", "T3FREE", "THYROIDAB" in the last 72 hours. Anemia Panel: No results for input(s): "VITAMINB12", "FOLATE", "FERRITIN", "TIBC", "IRON", "RETICCTPCT" in the last 72 hours. Sepsis Labs: No results for input(s): "PROCALCITON", "LATICACIDVEN" in the last 168 hours.  No results found for this or any previous visit (from the past 240 hour(s)).       Radiology Studies: DG Abd Portable 1V  Result Date: 07/16/2023 CLINICAL DATA:  [id number] Ileus (HCC) 98749 EXAM: PORTABLE ABDOMEN - 1  VIEW COMPARISON:  07/15/2023 abdominal radiograph FINDINGS: No disproportionately dilated small bowel loops. Prominent diffuse colonic gaseous distension, most prominent in the sigmoid colon, improved, sigmoid diameter 15.5 cm, previously 18.0 cm. Large reduction in colonic stool burden. No evidence of pneumatosis or pneumoperitoneum. IMPRESSION: Prominent diffuse colonic gaseous distension, improved, most prominent in the sigmoid colon, compatible with improving ileus. Large reduction in colonic stool burden. Electronically Signed   By: Delbert Phenix M.D.   On: 07/16/2023 11:12        Scheduled Meds:  atorvastatin  20 mg Oral Daily   enoxaparin (LOVENOX) injection  40 mg Subcutaneous Q24H   insulin aspart  0-5 Units Subcutaneous QHS   insulin aspart  0-9 Units Subcutaneous TID WC   losartan  25 mg Oral Daily   polyethylene glycol  17 g Oral  Q8H   senna-docusate  2 tablet Oral BID   Continuous Infusions:   LOS: 0 days   Time spent:  Azucena Fallen, DO [facility]  If 7PM-7AM, please contact night-coverage [email or web address]  07/18/2023, 6:57 AM

## 2023-07-19 DIAGNOSIS — N1831 Chronic kidney disease, stage 3a: Secondary | ICD-10-CM | POA: Diagnosis not present

## 2023-07-19 DIAGNOSIS — I10 Essential (primary) hypertension: Secondary | ICD-10-CM

## 2023-07-19 DIAGNOSIS — E785 Hyperlipidemia, unspecified: Secondary | ICD-10-CM | POA: Diagnosis not present

## 2023-07-19 DIAGNOSIS — R2681 Unsteadiness on feet: Secondary | ICD-10-CM | POA: Insufficient documentation

## 2023-07-19 DIAGNOSIS — K5900 Constipation, unspecified: Secondary | ICD-10-CM | POA: Diagnosis not present

## 2023-07-19 DIAGNOSIS — Z8673 Personal history of transient ischemic attack (TIA), and cerebral infarction without residual deficits: Secondary | ICD-10-CM

## 2023-07-19 DIAGNOSIS — F17200 Nicotine dependence, unspecified, uncomplicated: Secondary | ICD-10-CM

## 2023-07-19 LAB — GLUCOSE, CAPILLARY
Glucose-Capillary: 124 mg/dL — ABNORMAL HIGH (ref 70–99)
Glucose-Capillary: 117 mg/dL — ABNORMAL HIGH (ref 70–99)

## 2023-07-19 NOTE — Plan of Care (Signed)

## 2023-07-19 NOTE — TOC Transition Note (Signed)
Transition of Care [facility]) - CM/SW Discharge Note   Patient Details  Name: Calvin Jackson MRN: [id number] Date of Birth: Nov 12, 1961  Transition of [facility]) CM/SW Contact:  Ralene Bathe, LCSW Phone Number: 07/19/2023, 12:48 PM   Clinical Narrative:    Patient will DC to: [place] Anticipated DC date: 07/19/2023 Family notified: patient alert and oriented, [facility] Child psychotherapist informed Transport by: [facility]   Per MD patient ready for DC to SNF. RN to call report prior to discharge [phone] room 211a). RN, patient, and facility notified of DC.  Patient informed that the facility has a COVID outbreak at this time.  Discharge Summary sent to facility. DC packet on chart. [facility] will transport.   CSW will sign off for now as social work intervention is no longer needed. Please consult [place] again if new needs arise.    Final next level of care: Skilled Nursing Facility Barriers to Discharge: No Barriers Identified   Patient Goals and CMS Choice      Discharge Placement                Patient [facility] at:  [facility]) Patient to be transferred to facility by: [facility] Name of family member notified: Patient alert and oriented Patient and family notified of of transfer: 07/19/23  Discharge Plan and Services Additional resources added to the After Visit Summary for                                       Social Determinants of Health (SDOH) Interventions SDOH Screenings   Food Insecurity: No Food Insecurity (07/15/2023)  Housing: Low Risk  (07/15/2023)  Transportation Needs: No Transportation Needs (07/15/2023)  Utilities: Not At Risk (07/15/2023)  Tobacco Use: High Risk (07/14/2023)     Readmission Risk Interventions     No data to display

## 2023-07-19 NOTE — Discharge Summary (Signed)
Physician Discharge Summary   Patient: Calvin Jackson MRN: [id number] DOB: Aug 08, 1961  Admit date:     07/14/2023  Discharge date: 07/19/23  Discharge Physician: Marcelino Duster   PCP: [facility]   Recommendations at discharge:    PCP follow up in 1 week. GI outpatient recommended for dysmotility evaluation.  Discharge Diagnoses: Principal Problem:   Obstipation Active Problems:   Essential hypertension   Dyslipidemia   Tobacco dependence   H/O: CVA (cerebrovascular accident)   Unsteady gait   CKD stage 3a, GFR 45-59 ml/min (HCC)  Resolved Problems:   * No resolved hospital problems. *  Hospital Course: Calvin Jackson is a 62 y.o. male with medical history significant of prior CVA, hypertension, dyslipidemia, diabetes mellitus.  Patient presented to the ER complaining of abdominal pain and swelling for 2 days.   Patient had multiple bowel movements overnight with supportive care, feels markedly improved.  Reviewed patient's distant history, CT from 2022 appears nearly identical to imaging during his hospitalization, concerning for a more chronic condition - GI evaluated him, repeat KUB much improved.  Patient clinical status better on MiraLAX and Senokot advised to continue regimen at discharge. Patient's family requested SNF placement given his unsteady gait with no help at home. He is hemodynamically stable to be discharged home.  Advised to follow-up with PCP upon discharge as instructed.  Assessment and Plan: Acute on chronic constipation/obstipation with abdominal distention but no bowel obstruction GI evaluated him during this admission.  Patient did improve on constipation regimen. Continues to pass flatus and stool. 1 episode of dark stool noted. CT 2022 shows very similar appearance - likely chronic (?chronic dysmotility disorder) Patient denies abdominal pain, bowel sounds are present although hypoactive.   Constipation regimen  prescribed.   Diabetes mellitus 2- diet controlled A1C 5.7. Diabetes education provided.   History of CVA Chronic ambulatory dysfunction -Reports he has appointed legal guardian(s - 9 of them?) -No documentation located in chart but next point of contact is his sister who did not return phone calls -Continue statin. -Walker at baseline - continues to be weak with R sided deficits (mechanical fall from bed side commode 8/11 without injury). SNF placement.   Hypertension, well controlled Continue Norvasc.   Chronic kidney disease stage IIIa Baseline creatinine has ranged between 1.18 and 1.36 Current creatinine is 1.42 with a GFR of 55 Follow kidney function outpatient.   Dyslipidemia Continue statin/ soft heart heathy diet        Consultants: GI Procedures performed: None Disposition: Skilled nursing facility Diet recommendation:  Discharge Diet Orders (From admission, onward)     Start     Ordered   07/19/23 0000  Diet - low sodium heart healthy       Comments: Soft diet, thin liquids   07/19/23 1153           Cardiac diet DISCHARGE MEDICATION: Allergies as of 07/19/2023   No Known Allergies      Medication List     STOP taking these medications    amLODipine 10 MG tablet Commonly known as: NORVASC       TAKE these medications    acetaminophen 650 MG CR tablet Commonly known as: TYLENOL Take 650 mg by mouth every 8 (eight) hours as needed for pain.   albuterol 108 (90 Base) MCG/ACT inhaler Commonly known as: VENTOLIN HFA Inhale 1-2 puffs into the lungs every 6 (six) hours as needed for wheezing or shortness of breath.  aspirin 81 MG chewable tablet Chew 1 tablet (81 mg total) by mouth daily.   atorvastatin 20 MG tablet Commonly known as: LIPITOR Take 20 mg by mouth daily.   cholecalciferol 25 MCG (1000 UNIT) tablet Commonly known as: VITAMIN D3 Take 1,000 Units by mouth daily.   losartan 50 MG tablet Commonly known as: COZAAR Take  50 mg by mouth daily.   methocarbamol 500 MG tablet Commonly known as: ROBAXIN Take 500 mg by mouth every 8 (eight) hours as needed.   nicotine polacrilex 2 MG gum Commonly known as: NICORETTE Take 2 mg by mouth as needed for smoking cessation.   polyethylene glycol powder 17 GM/SCOOP powder Commonly known as: GlycoLax Take 17 g by mouth 2 (two) times daily.   senna-docusate 8.6-50 MG tablet Commonly known as: Senokot-S Take 2 tablets by mouth 2 (two) times daily. What changed: when to take this        Discharge Exam: Filed Weights   07/14/23 1831 07/15/23 1048  Weight: 73.5 kg 73.5 kg   General - Elderly African-American male, no apparent distress HEENT - PERRLA, EOMI, atraumatic head, non tender sinuses. Lung - Clear, rales, rhonchi, wheezes. Heart - S1, S2 heard, no murmurs, rubs, trace pedal edema. Abdomen soft, nontender, distended.  Bowel sounds good Neuro - Alert, awake and oriented x 3, right sided weakness Skin - Warm and dry.  Condition at discharge: stable  The results of significant diagnostics from this hospitalization (including imaging, microbiology, ancillary and laboratory) are listed below for reference.   Imaging Studies: DG Abd Portable 1V  Result Date: 07/16/2023 CLINICAL DATA:  [id number] Ileus (HCC) [id number] EXAM: PORTABLE ABDOMEN - 1 VIEW COMPARISON:  07/15/2023 abdominal radiograph FINDINGS: No disproportionately dilated small bowel loops. Prominent diffuse colonic gaseous distension, most prominent in the sigmoid colon, improved, sigmoid diameter 15.5 cm, previously 18.0 cm. Large reduction in colonic stool burden. No evidence of pneumatosis or pneumoperitoneum. IMPRESSION: Prominent diffuse colonic gaseous distension, improved, most prominent in the sigmoid colon, compatible with improving ileus. Large reduction in colonic stool burden. Electronically Signed   By: Delbert Phenix M.D.   On: 07/16/2023 11:12   DG Abd 2 Views  Result Date:  07/15/2023 CLINICAL DATA:  Constipatio constipation n bike EXAM: ABDOMEN - 2 VIEW COMPARISON:  CT 07/14/2023, 09/24/2021 FINDINGS: Large stool burden in the rectosigmoid colon as seen on comparison CT 1 day prior. Rectosigmoid colon distended to 13 cm. Stool burden extends into the mid abdomen. gas-filled loops of LEFT and RIGHT colon identified which are normal caliber. No intraperitoneal free air. No small bowel dilatation. IMPRESSION: Dramatically large stool burden within the rectosigmoid colon unchanged from CT 1 day prior. Findings similar to but advanced from more remote CT 09/24/2021. Electronically Signed   By: Genevive Bi M.D.   On: 07/15/2023 11:34   CT ABDOMEN PELVIS W CONTRAST  Result Date: 07/14/2023 CLINICAL DATA:  Abdominal pain/swelling, suspected hernia EXAM: CT ABDOMEN AND PELVIS WITH CONTRAST TECHNIQUE: Multidetector CT imaging of the abdomen and pelvis was performed using the standard protocol following bolus administration of intravenous contrast. RADIATION DOSE REDUCTION: This exam was performed according to the departmental dose-optimization program which includes automated exposure control, adjustment of the mA and/or kV according to patient size and/or use of iterative reconstruction technique. CONTRAST:  65mL OMNIPAQUE IOHEXOL 350 MG/ML SOLN COMPARISON:  09/24/2021 FINDINGS: Lower chest: Lung bases are clear. Hepatobiliary: Scattered subcentimeter hepatic cysts. Gallbladder is unremarkable. No intrahepatic or extrahepatic duct dilatation. Pancreas: Within normal limits. Spleen:  Within normal limits. Adrenals/Urinary Tract: Adrenal glands are within normal limits. Kidneys are within normal limits. Mild fullness of the right renal collecting system, without frank hydronephrosis. Bladder is displaced beneath the left anterior abdominal wall. Stomach/Bowel: Small hiatal hernia with mild wall thickening/edema along the distal esophagus, unchanged from remote prior. No evidence of small  bowel obstruction. Appendix is not discretely visualized, possibly related to extrinsic compression. Large rectosigmoid colonic stool burden, reflecting fecal impaction, although remarkably similar to remote prior. Rectosigmoid colon measures up to 17.1 cm in maximal transverse dimension (series 3/image 63). No wall thickening, pneumatosis, or inflammatory changes to suggest stercoral colitis. Vascular/Lymphatic: No evidence of abdominal aortic aneurysm. Atherosclerotic calcifications of the abdominal aorta and branch vessels, although patent. No suspicious abdominopelvic lymphadenopathy. Reproductive: Prostate is unremarkable. Other: No abdominopelvic ascites. Musculoskeletal: Visualized osseous structures are within normal limits. IMPRESSION: Large rectosigmoid colonic stool burden, reflecting fecal impaction, although remarkably similar to remote prior. No evidence of stercoral colitis. Rectosigmoid colon is dilated up to 17.1 cm. Additional ancillary findings as above. Electronically Signed   By: Charline Bills M.D.   On: 07/14/2023 23:00    Microbiology: Results for orders placed or performed during the hospital encounter of 07/03/22  Resp Panel by RT-PCR (Flu A&B, Covid) Anterior Nasal Swab     Status: None   Collection Time: 07/03/22  5:36 AM   Specimen: Anterior Nasal Swab  Result Value Ref Range Status   SARS Coronavirus 2 by RT PCR NEGATIVE NEGATIVE Final    Comment: (NOTE) SARS-CoV-2 target nucleic acids are NOT DETECTED.  The SARS-CoV-2 RNA is generally detectable in upper respiratory specimens during the acute phase of infection. The lowest concentration of SARS-CoV-2 viral copies this assay can detect is 138 copies/mL. A negative result does not preclude SARS-Cov-2 infection and should not be used as the sole basis for treatment or other patient management decisions. A negative result may occur with  improper specimen collection/handling, submission of specimen other than  nasopharyngeal swab, presence of viral mutation(s) within the areas targeted by this assay, and inadequate number of viral copies(<138 copies/mL). A negative result must be combined with clinical observations, patient history, and epidemiological information. The expected result is Negative.  Fact Sheet for Patients:  BloggerCourse.com  Fact Sheet for Healthcare Providers:  SeriousBroker.it  This test is no t yet approved or cleared by the [place] FDA and  has been authorized for detection and/or diagnosis of SARS-CoV-2 by FDA under an Emergency Use Authorization (EUA). This EUA will remain  in effect (meaning this test can be used) for the duration of the COVID-19 declaration under Section 564(b)(1) of the Act, 21 U.S.C.section 360bbb-3(b)(1), unless the authorization is terminated  or revoked sooner.       Influenza A by PCR NEGATIVE NEGATIVE Final   Influenza B by PCR NEGATIVE NEGATIVE Final    Comment: (NOTE) The Xpert Xpress SARS-CoV-2/FLU/RSV plus assay is intended as an aid in the diagnosis of influenza from Nasopharyngeal swab specimens and should not be used as a sole basis for treatment. Nasal washings and aspirates are unacceptable for Xpert Xpress SARS-CoV-2/FLU/RSV testing.  Fact Sheet for Patients: BloggerCourse.com  Fact Sheet for Healthcare Providers: SeriousBroker.it  This test is not yet approved or cleared by the [place] FDA and has been authorized for detection and/or diagnosis of SARS-CoV-2 by FDA under an Emergency Use Authorization (EUA). This EUA will remain in effect (meaning this test can be used) for the duration of the COVID-19 declaration under  Section 564(b)(1) of the Act, 21 U.S.C. section 360bbb-3(b)(1), unless the authorization is terminated or revoked.  Performed at [facility], [address], [place], [place] [address]      Labs: CBC: Recent Labs  Lab 07/14/23 1841 07/15/23 1110 07/16/23 0442 07/16/23 1357  WBC 4.4 8.3 11.8*  --   HGB 15.4 16.0 14.9 14.1  HCT 43.5 44.9 43.1 40.3  MCV 76.3* 75.7* 76.3*  --   PLT 199 178 181  --    Basic Metabolic Panel: Recent Labs  Lab 07/14/23 1841 07/15/23 1110 07/16/23 0442  NA 139  --  136  K 3.9  --  4.1  CL 102  --  101  CO2 22  --  22  GLUCOSE 187*  --  173*  BUN 30*  --  35*  CREATININE 1.42* 1.28* 1.40*  CALCIUM 9.0  --  8.5*   Liver Function Tests: Recent Labs  Lab 07/14/23 1841  AST 23  ALT 22  ALKPHOS 51  BILITOT 0.9  PROT 7.2  ALBUMIN 3.7   CBG: Recent Labs  Lab 07/18/23 1137 07/18/23 1620 07/18/23 2013 07/19/23 0715 07/19/23 1135  GLUCAP 97 102* 158* 124* 117*    Discharge time spent: greater than 30 minutes.  Signed: Marcelino Duster, MD [facility] 07/19/2023

## 2023-07-19 NOTE — Progress Notes (Signed)
DISCHARGE NOTE HOME Calvin Jackson to be discharged [facility] per MD order. Discussed prescriptions and follow up appointments with the patient and Lawanna Kobus (staff at [facility]) Prescriptions given to patient; medication list explained in detail. Patient verbalized understanding.  Skin clean, dry and intact without evidence of skin break down, no evidence of skin tears noted. IV catheter discontinued intact. Site without signs and symptoms of complications. Dressing and pressure applied. Pt denies pain at the site currently. No complaints noted.  Patient free of lines, drains, and wounds.   An After Visit Summary (AVS) was printed and given to the patient. Patient escorted via wheelchair, and discharged via [facility]  Margarita Grizzle, RN

## 2023-09-19 ENCOUNTER — Ambulatory Visit: Payer: Medicaid Other | Admitting: Gastroenterology

## 2023-10-10 ENCOUNTER — Ambulatory Visit
Admission: RE | Admit: 2023-10-10 | Discharge: 2023-10-10 | Disposition: A | Discharge status: Home / Self Care | Payer: Medicaid Other | Source: Ambulatory Visit | Attending: Vascular Surgery | Admitting: Vascular Surgery

## 2023-10-10 ENCOUNTER — Other Ambulatory Visit: Payer: Self-pay | Admitting: Vascular Surgery

## 2023-10-10 DIAGNOSIS — R051 Acute cough: Secondary | ICD-10-CM

## 2023-12-16 ENCOUNTER — Emergency Department (HOSPITAL_COMMUNITY)
Admission: EM | Admit: 2023-12-16 | Discharge: 2023-12-16 | Disposition: A | Discharge status: Home / Self Care | Payer: Medicaid Other | Attending: Emergency Medicine | Admitting: Emergency Medicine

## 2023-12-16 ENCOUNTER — Other Ambulatory Visit: Payer: Self-pay

## 2023-12-16 ENCOUNTER — Encounter (HOSPITAL_COMMUNITY): Payer: Self-pay

## 2023-12-16 DIAGNOSIS — M25551 Pain in right hip: Secondary | ICD-10-CM | POA: Insufficient documentation

## 2023-12-16 DIAGNOSIS — Z7982 Long term (current) use of aspirin: Secondary | ICD-10-CM | POA: Diagnosis not present

## 2023-12-16 DIAGNOSIS — E1122 Type 2 diabetes mellitus with diabetic chronic kidney disease: Secondary | ICD-10-CM | POA: Diagnosis not present

## 2023-12-16 DIAGNOSIS — N189 Chronic kidney disease, unspecified: Secondary | ICD-10-CM | POA: Insufficient documentation

## 2023-12-16 DIAGNOSIS — I129 Hypertensive chronic kidney disease with stage 1 through stage 4 chronic kidney disease, or unspecified chronic kidney disease: Secondary | ICD-10-CM | POA: Diagnosis not present

## 2023-12-16 DIAGNOSIS — Z79899 Other long term (current) drug therapy: Secondary | ICD-10-CM | POA: Insufficient documentation

## 2023-12-16 MED ORDER — IBUPROFEN 800 MG PO TABS: 800.0000 mg | ORAL_TABLET | Freq: Once | ORAL | Status: DC

## 2023-12-16 MED ORDER — CYCLOBENZAPRINE HCL 10 MG PO TABS
5.0000 mg | ORAL_TABLET | Freq: Once | ORAL | Status: AC
  Administered 2023-12-16: 5 mg via ORAL
  Filled 2023-12-16: qty 1

## 2023-12-16 MED ORDER — CYCLOBENZAPRINE HCL 10 MG PO TABS: 10.0000 mg | ORAL_TABLET | Freq: Two times a day (BID) | ORAL | 0 refills | Status: AC | PRN

## 2023-12-16 MED ORDER — ACETAMINOPHEN ER 650 MG PO TBCR: 650.0000 mg | EXTENDED_RELEASE_TABLET | Freq: Three times a day (TID) | ORAL | 0 refills | Status: DC | PRN

## 2023-12-16 MED ORDER — ACETAMINOPHEN 500 MG PO TABS
1000.0000 mg | ORAL_TABLET | Freq: Once | ORAL | Status: AC
  Administered 2023-12-16: 1000 mg via ORAL
  Filled 2023-12-16: qty 2

## 2023-12-16 NOTE — ED Triage Notes (Signed)
 Pt reports with dull stabbing hip pain that started this morning. Pt walks but it is painful.

## 2023-12-16 NOTE — ED Provider Notes (Signed)
 [place] EMERGENCY DEPARTMENT AT [facility] Provider Note   CSN: [id number] Arrival date & time: 12/16/23  1706     History  Chief Complaint  Patient presents with   Hip Pain    Calvin Jackson is a 63 y.o. male.  The history is provided by the patient and medical records. No language interpreter was used.  Hip Pain     63 year old male with history of diabetes, prior stroke, hypertension, CKD presenting with complaint of hip pain.  Patient report having pain to his right hip/inguinal region throughout the day today.  Pain is sharp, constant, nonradiating, present at rest but worse with walking.  No associated fever or chills no back pain no urinary discomfort no hematuria no hernia no new numbness or new weakness.  He denies any recent strenuous activities or heavy lifting and denies any trauma.  No specific treatment tried prior to arrival.  Patient does use a walker to walk.  This is secondary to his prior stroke causing gait instability.  Home Medications Prior to Admission medications   Medication Sig Start Date End Date Taking? Authorizing Provider  acetaminophen  (TYLENOL ) 650 MG CR tablet Take 650 mg by mouth every 8 (eight) hours as needed for pain.    [provider]  albuterol  (VENTOLIN  HFA) 108 (90 Base) MCG/ACT inhaler Inhale 1-2 puffs into the lungs every 6 (six) hours as needed for wheezing or shortness of breath. 02/28/22   Prosperi, Christian H, PA-C  aspirin  81 MG chewable tablet Chew 1 tablet (81 mg total) by mouth daily. 10/18/20   Patsy Lenis, MD  atorvastatin  (LIPITOR) 20 MG tablet Take 20 mg by mouth daily.    [provider]  cholecalciferol (VITAMIN D3) 25 MCG (1000 UNIT) tablet Take 1,000 Units by mouth daily.    [provider]  losartan  (COZAAR ) 50 MG tablet Take 50 mg by mouth daily. 08/31/20   [provider]  methocarbamol (ROBAXIN) 500 MG tablet Take 500 mg by mouth every 8 (eight) hours as needed.  12/13/22   [provider]  nicotine polacrilex (NICORETTE) 2 MG gum Take 2 mg by mouth as needed for smoking cessation. 12/06/22   [provider]  polyethylene glycol powder (GLYCOLAX ) 17 GM/SCOOP powder Take 17 g by mouth 2 (two) times daily. 07/16/23   Lue Elsie BROCKS, MD  senna-docusate (SENOKOT-S) 8.6-50 MG tablet Take 2 tablets by mouth 2 (two) times daily. 07/16/23   Lue Elsie BROCKS, MD      Allergies    Patient has no known allergies.    Review of Systems   Review of Systems  All other systems reviewed and are negative.   Physical Exam Updated Vital Signs BP (!) 160/97   Pulse 65   Temp 98.5 F (36.9 C) (Oral)   Resp 14   Ht 5' 10 (1.778 m)   Wt 80.3 kg   SpO2 98%   BMI 25.40 kg/m  Physical Exam Vitals and nursing note reviewed.  Constitutional:      General: He is not in acute distress.    Appearance: He is well-developed.  HENT:     Head: Atraumatic.  Eyes:     Conjunctiva/sclera: Conjunctivae normal.  Abdominal:     Palpations: Abdomen is soft.     Tenderness: There is no abdominal tenderness.  Musculoskeletal:        General: Tenderness (Right hip: Tenderness noted to right inguinal region without any overlying skin changes no hernia no lymphadenopathy.  Able to flex extend abduct and adduct hip.  Mild tenderness to proximal thigh.) present.     Cervical back: Neck supple.  Skin:    Capillary Refill: Capillary refill takes less than 2 seconds.     Findings: No rash.  Neurological:     Mental Status: He is alert. Mental status is at baseline.     ED Results / Procedures / Treatments   Labs (all labs ordered are listed, but only abnormal results are displayed) Labs Reviewed - No data to display  EKG None  Radiology No results found.  Procedures Procedures    Medications Ordered in ED Medications - No data to display  ED Course/ Medical Decision Making/ A&P                                 Medical Decision  Making Risk OTC drugs. Prescription drug management.   BP (!) 160/97   Pulse 65   Temp 98.5 F (36.9 C) (Oral)   Resp 14   Ht 5' 10 (1.778 m)   Wt 80.3 kg   SpO2 98%   BMI 25.40 kg/m   3:60 PM  63 year old male with history of diabetes, prior stroke, hypertension, CKD presenting with complaint of hip pain.  Patient report having pain to his right hip/inguinal region throughout the day today.  Pain is sharp, constant, nonradiating, present at rest but worse with walking.  No associated fever or chills no back pain no urinary discomfort no hematuria no hernia no new numbness or new weakness.  He denies any recent strenuous activities or heavy lifting and denies any trauma.  No specific treatment tried prior to arrival.  Patient does use a walker to walk.  This is secondary to his prior stroke causing gait instability.  Exam notable for tenderness to the right inguinal region without any signs of infection or hernia no deformity no shortening of the leg.  He ambulates with an antalgic gait but he attributed to not using his walker as he often walks with his walker.  He is neurovascularly intact.  He does not have any midline spine tenderness.  Vitals are notable for elevated blood pressure of 160/97.  Patient is afebrile no hypoxia.  Since pain is reproducible, suspect this is likely muscle skeletal pain and I have low suspicion for cauda equina syndrome, fracture or dislocation, new stroke, or other acute emergent medical condition.  Will provide supportive care which include non anti-inflammatory medications as well as muscle relaxants.  Patient report improvement of symptoms after receiving medication.  She is stable for discharge.  Outpatient follow-up recommended.        Final Clinical Impression(s) / ED Diagnoses Final diagnoses:  Right hip pain    Rx / DC Orders ED Discharge Orders          Ordered    acetaminophen  (TYLENOL ) 650 MG CR tablet  Every 8 hours PRN         12/16/23 1841    cyclobenzaprine  (FLEXERIL ) 10 MG tablet  2 times daily PRN        12/16/23 1841              Nivia Colon, PA-C 12/16/23 1842    Dasie Faden, MD 12/18/23 1457

## 2023-12-16 NOTE — Discharge Instructions (Addendum)
 Please take medications prescribed as needed for your hip pain.  Follow-up closely with your doctor for further care.  Return if you have any concern.

## 2023-12-26 ENCOUNTER — Ambulatory Visit: Payer: Medicaid Other | Admitting: Physician Assistant

## 2023-12-26 NOTE — Progress Notes (Deleted)
12/26/2023 Calvin Jackson [id number] [birth year]  Referring provider: Inc, [facility]* Primary GI doctor: {acdocs:[id number]}  ASSESSMENT AND PLAN:   Assessment and Plan              Patient Care Team: Inc, [facility] as PCP - General  HISTORY OF PRESENT ILLNESS: 63 y.o. male with a past medical history of type 2 diabetes with neuropathy, dyslipidemia, hypertension, history of CVA, tobacco use and marijuana use, previous anterior cervical discectomy/fusion and others listed below presents as a new patient for evaluation of chronic constipation/dysmotility.   Initially seen in consultation in the hospital 07/15/2023 by Dr. Leone Payor for chronic constipation and chronic megarectum/megacolon persistent since 2022.  Treated with MiraLAX purge discussed eating chronic bowel regiment and lumbar sacral spine workup with possible neurogenic bladder as well. Previous colonoscopy unable to be seen several years ago in [place]. Patient presents for follow-up.  Discussed the use of AI scribe software for clinical note transcription with the patient, who gave verbal consent to proceed.  History of Present Illness             He  reports that he has been smoking. He has a 23.5 pack-year smoking history. He has never used smokeless tobacco. He reports that he does not currently use alcohol. He reports current drug use. Drug: Marijuana.  RELEVANT GI HISTORY, LABS, IMAGING: 07/14/2023 CT abdomen pelvis with contrast for abdominal pain during hospitalization Large rectosigmoid colonic stool burden, reflecting fecal impaction, although remarkably similar to remote prior. No evidence of stercoral colitis. Rectosigmoid colon is dilated up to 17.1 cm. Small hiatal hernia with mild wall thickening/edema in distal esophagus unchanged from remote prior CT Mild fullness of right renal collecting system without frank hydronephrosis with bladder displacement beneath  left anterior abdominal wall CBC    Component Value Date/Time   WBC 11.8 (H) 07/16/2023 0442   RBC 5.65 07/16/2023 0442   HGB 14.1 07/16/2023 1357   HCT 40.3 07/16/2023 1357   PLT 181 07/16/2023 0442   MCV 76.3 (L) 07/16/2023 0442   MCH 26.4 07/16/2023 0442   MCHC 34.6 07/16/2023 0442   RDW 14.4 07/16/2023 0442   LYMPHSABS 3.2 07/03/2022 0540   MONOABS 0.7 07/03/2022 0540   EOSABS 0.8 (H) 07/03/2022 0540   BASOSABS 0.1 07/03/2022 0540   Recent Labs    07/14/23 1841 07/15/23 1110 07/16/23 0442 07/16/23 1357  HGB 15.4 16.0 14.9 14.1    CMP     Component Value Date/Time   NA 136 07/16/2023 0442   K 4.1 07/16/2023 0442   CL 101 07/16/2023 0442   CO2 22 07/16/2023 0442   GLUCOSE 173 (H) 07/16/2023 0442   BUN 35 (H) 07/16/2023 0442   CREATININE 1.40 (H) 07/16/2023 0442   CALCIUM 8.5 (L) 07/16/2023 0442   PROT 7.2 07/14/2023 1841   ALBUMIN 3.7 07/14/2023 1841   AST 23 07/14/2023 1841   ALT 22 07/14/2023 1841   ALKPHOS 51 07/14/2023 1841   BILITOT 0.9 07/14/2023 1841   GFRNONAA 56 (L) 07/16/2023 0442      Latest Ref Rng & Units 07/14/2023    6:41 PM 07/03/2022    5:40 AM 02/28/2022   12:54 PM  Hepatic Function  Total Protein 6.5 - 8.1 g/dL 7.2  7.2  7.4   Albumin 3.5 - 5.0 g/dL 3.7  3.8  3.7   AST 15 - 41 U/L 23  35  21   ALT 0 -  44 U/L 22  26  21    Alk Phosphatase 38 - 126 U/L 51  62  62   Total Bilirubin 0.3 - 1.2 mg/dL 0.9  0.8  0.8       Current Medications:    Current Outpatient Medications (Cardiovascular):    atorvastatin (LIPITOR) 20 MG tablet, Take 20 mg by mouth daily.   losartan (COZAAR) 50 MG tablet, Take 50 mg by mouth daily.  Current Outpatient Medications (Respiratory):    albuterol (VENTOLIN HFA) 108 (90 Base) MCG/ACT inhaler, Inhale 1-2 puffs into the lungs every 6 (six) hours as needed for wheezing or shortness of breath.  Current Outpatient Medications (Analgesics):    acetaminophen (TYLENOL) 650 MG CR tablet, Take 1 tablet (650 mg total)  by mouth every 8 (eight) hours as needed for pain.   aspirin 81 MG chewable tablet, Chew 1 tablet (81 mg total) by mouth daily.   Current Outpatient Medications (Other):    cholecalciferol (VITAMIN D3) 25 MCG (1000 UNIT) tablet, Take 1,000 Units by mouth daily.   cyclobenzaprine (FLEXERIL) 10 MG tablet, Take 1 tablet (10 mg total) by mouth 2 (two) times daily as needed for muscle spasms.   methocarbamol (ROBAXIN) 500 MG tablet, Take 500 mg by mouth every 8 (eight) hours as needed.   nicotine polacrilex (NICORETTE) 2 MG gum, Take 2 mg by mouth as needed for smoking cessation.   polyethylene glycol powder (GLYCOLAX) 17 GM/SCOOP powder, Take 17 g by mouth 2 (two) times daily.   senna-docusate (SENOKOT-S) 8.6-50 MG tablet, Take 2 tablets by mouth 2 (two) times daily.  Medical History:  Past Medical History:  Diagnosis Date   Diabetes mellitus with neurological manifestation (HCC)    Dyslipidemia    Essential hypertension    Marijuana abuse 10/15/2020   Stroke (HCC)    Tobacco dependence 10/15/2020   Allergies: No Known Allergies   Surgical History:  He  has a past surgical history that includes Anterior cervical decomp/discectomy fusion (2024) and Colonoscopy. Family History:  His family history is not on file.  REVIEW OF SYSTEMS  : All other systems reviewed and negative except where noted in the History of Present Illness.  PHYSICAL EXAM: There were no vitals taken for this visit. General Appearance: Well nourished, in no apparent distress. Head:   Normocephalic and atraumatic. Eyes:  sclerae anicteric,conjunctive pink  Respiratory: Respiratory effort normal, BS equal bilaterally without rales, rhonchi, wheezing. Cardio: RRR with no MRGs. Peripheral pulses intact.  Abdomen: Soft,  {BlankSingle:19197::"Flat","Obese","Non-distended"} ,active bowel sounds. {actendernessAB:27319} tenderness {anatomy; site abdomen:5010}. {BlankMultiple:19196::"Without guarding","With guarding","Without  rebound","With rebound"}. No masses. Rectal: {acrectalexam:27461} Musculoskeletal: Full ROM, {PSY - GAIT AND STATION:22860} gait. {With/Without:[id number]} edema. Skin:  Dry and intact without significant lesions or rashes Neuro: Alert and  oriented x4;  No focal deficits. Psych:  Cooperative. Normal mood and affect.    Doree Albee, PA-C 7:44 AM

## 2024-02-07 ENCOUNTER — Ambulatory Visit: Payer: Medicaid Other | Admitting: Physician Assistant

## 2024-02-07 ENCOUNTER — Encounter: Payer: Self-pay | Admitting: Physician Assistant

## 2024-02-07 VITALS — BP 110/72 | HR 73 | Wt 174.4 lb

## 2024-02-07 DIAGNOSIS — K5909 Other constipation: Secondary | ICD-10-CM

## 2024-02-07 DIAGNOSIS — E1142 Type 2 diabetes mellitus with diabetic polyneuropathy: Secondary | ICD-10-CM

## 2024-02-07 DIAGNOSIS — K5939 Other megacolon: Secondary | ICD-10-CM | POA: Diagnosis not present

## 2024-02-07 DIAGNOSIS — K5900 Constipation, unspecified: Secondary | ICD-10-CM

## 2024-02-07 DIAGNOSIS — K429 Umbilical hernia without obstruction or gangrene: Secondary | ICD-10-CM

## 2024-02-07 DIAGNOSIS — R3911 Hesitancy of micturition: Secondary | ICD-10-CM

## 2024-02-07 DIAGNOSIS — N1831 Chronic kidney disease, stage 3a: Secondary | ICD-10-CM

## 2024-02-07 DIAGNOSIS — I69359 Hemiplegia and hemiparesis following cerebral infarction affecting unspecified side: Secondary | ICD-10-CM

## 2024-02-07 NOTE — Addendum Note (Signed)
 Addended by: Rise Paganini on: 02/07/2024 03:12 PM   Modules accepted: Orders

## 2024-02-07 NOTE — Patient Instructions (Addendum)
 Your provider has requested that you have an abdominal x ray before leaving today. Please go to the basement floor to our Radiology department for the test.  Please do the following: Purchase a bottle of Miralax over the counter as well as a box of 5 mg dulcolax tablets. Take 4 dulcolax tablets. Wait 1 hour. You will then drink 6-8 capfuls of Miralax mixed in an adequate amount of water/juice/gatorade (you may choose which of these liquids to drink) over the next 2-3 hours. You should expect results within 1 to 6 hours after completing the bowel purge. Go to the er if you have severe AB pain, can not pass gas or stool in over 12 hours, can not hold down any food.  Can do one or two enemas with it  After the bowel purge, can start on  linzess 290 mcg daily with miralax   Linzess 290 *IBS-C patients may begin to experience relief from belly pain and overall abdominal symptoms (pain, discomfort, and bloating) in about 1 week,  with symptoms typically improving over 12 weeks.  Take at least 30 minutes before the first meal of the day on an empty stomach You can have a loose stool if you eat a high-fat breakfast. Give it at least 7 days, may have more bowel movements during that time.   The diarrhea should go away and you should start having normal, complete, full bowel movements.  It may be helpful to start treatment when you can be near the comfort of your own bathroom, such as a weekend.  After you are out we can send in a prescription if you did well, there is a prescription card  Adust miralax as needed see below Add on fiber  Miralax is an osmotic laxative.  It only brings more water into the stool.  This is safe to take daily.  Can take up to 17 gram of miralax twice a day.  Mix with juice or coffee.  Start 1 capful at night for 3-4 days and reassess your response in 3-4 days.  You can increase and decrease the dose based on your response.  Remember, it can take up to 3-4 days to  take effect OR for the effects to wear off.   I often pair this with benefiber in the morning to help assure the stool is not too loose.   If this does not help we will do motegrity  Advised to go to the ER if there is any severe abdominal pain, unable to hold down food/water, blood in stool or vomit, chest pain, shortness of breath, or any worsening symptoms.    Will refer to urology  Consider MRI lumbar  Toileting tips to help with your constipation - Drink at least 64-80 ounces of water/liquid per day. - Establish a time to try to move your bowels every day.  For many people, this is after a cup of coffee or after a meal such as breakfast. - Sit all of the way back on the toilet keeping your back fairly straight and while sitting up, try to rest the tops of your forearms on your upper thighs.   - Raising your feet with a step stool/squatty potty can be helpful to improve the angle that allows your stool to pass through the rectum. - Relax the rectum feeling it bulge toward the toilet water.  If you feel your rectum raising toward your body, you are contracting rather than relaxing. - Breathe in and slowly exhale. "Belly  breath" by expanding your belly towards your belly button. Keep belly expanded as you gently direct pressure down and back to the anus.  A low pitched GRRR sound can assist with increasing intra-abdominal pressure.  (Can also trying to blow on a pinwheel and make it move, this helps with the same belly breathing) - Repeat 3-4 times. If unsuccessful, contract the pelvic floor to restore normal tone and get off the toilet.  Avoid excessive straining. - To reduce excessive wiping by teaching your anus to normally contract, place hands on outer aspect of knees and resist knee movement outward.  Hold 5-10 second then place hands just inside of knees and resist inward movement of knees.  Hold 5 seconds.  Repeat a few times each way.  Go to the ER if unable to pass gas, severe AB  pain, unable to hold down food, any shortness of breath of chest pain.  Due to recent changes in healthcare laws, you may see the results of your imaging and laboratory studies on MyChart before your provider has had a chance to review them.  We understand that in some cases there may be results that are confusing or concerning to you. Not all laboratory results come back in the same time frame and the provider may be waiting for multiple results in order to interpret others.  Please give [place] 48 hours in order for your provider to thoroughly review all the results before contacting the office for clarification of your results.   It was a pleasure to see you today!  Thank you for trusting me with your gastrointestinal care!

## 2024-02-07 NOTE — Progress Notes (Addendum)
 63 year old    02/07/2024 Calvin Jackson [id number] [birth year]  Referring provider: Inc, [facility]* Primary GI doctor: Dr. Leone Payor  ASSESSMENT AND PLAN:     Chronic constipation with chronic megarectum and megacolon Previous colonoscopy in [place] 2-3 years ago, normal, uncertain where this was at Persistent since 2022 On senokot once a day without a BM, still no BM for 5 days, passing gas but is positional Also has possible neurogenic bowel issues -Patient has history of cervical neck fusion, no lower back pain, no numbness tingling in legs, can consider MRI lumbar -Will refer to urology -Will need to maximize medical therapy for bowel movements -Will get Xray, will do miralax bowel purge with enema and then start on linzess 290 daily with miralax as needed.  -Patient states he may have linzess at home, will call [facility] and get information.  -If this does not help can consider motegrity.  - may need referral to [facility] for motility clinic/surgical evaluation.   Addendum: Notice from urology, has tried to call the patient without response.   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 No tenderness, strangulation, or obstruction. - Monitor for symptoms of strangulation such as severe pain, inability to pass gas, nausea, or vomiting.  Chronic obstructive pulmonary disease (COPD) COPD with left-sided wheezing. Uses breathing machine. No shortness of breath or chest pain. - Advise use of breathing machine as needed. - Instruct to seek medical attention if experiencing increased mucus, fever, or chills.  Patient Care Team: Inc, [facility] as PCP - General   Primary gastroenterologist:  I think he could benefit from  physical medicine referral for help with post-stroke care and evaluation of neurogenic issues. Will ask for a referral.  Patients like this often benefit from periodic purge or enemas.  Iva Boop, MD, [facility]     HISTORY OF PRESENT ILLNESS: Discussed the use of AI scribe software for clinical note transcription with the patient, who gave verbal consent to proceed.  History of Present Illness   Calvin Jackson is a 63 year old male with chronic constipation and dysmotility disorder who presents for evaluation of constipation and chronic dysmotility disorder.  He experiences difficulty with bowel movements, stating 'I can't use the bathroom when I have to.' Bowel movements occur approximately every five days, and he needs to lie on his stomach to pass gas. No abdominal pain, nausea, vomiting, rectal pain, or weight loss. Stools are soft and watery. He has tried a Miralax purge without success.  He was hospitalized on July 15, 2023, for abdominal and right lower quadrant discomfort. Imaging showed a very dilated rectum and sigmoid colon full of stool. He improved with a small enema and Miralax purge. He has a history of GI care in [place], where a colonoscopy two years ago was normal. No family history of colon cancer.  He reports urinary issues, with difficulty initiating urination but can urinate if he relaxes. He does not see a urologist for this issue.  He has a history of a stroke, resulting in some right-sided weakness. He uses a walker when outside and tends to remain seated once he sits down. No lower back pain, numbness, or tingling in legs or feet, although his feet hurt after cutting his toenails.  He has COPD and uses a breathing machine for wheezing, particularly on the left side. No shortness of breath or chest pain. He smokes cigarettes occasionally and uses marijuana.        He  reports that he has been smoking cigarettes. He has a 23.5 pack-year smoking history. He has  never used smokeless tobacco. He reports that he does not currently use alcohol. He reports current drug use. Drug: Marijuana.  RELEVANT GI HISTORY, IMAGING AND LABS: CT abdomen pelvis with contrast 07/14/2023 in the ER for abdominal pain IMPRESSION: Large rectosigmoid colonic stool burden, reflecting fecal impaction, although remarkably similar to remote prior. No evidence of stercoral colitis. Rectosigmoid colon is dilated up to 17.1 cm.  Additional ancillary findings as above.  CBC    Component Value Date/Time   WBC 11.8 (H) 07/16/2023 0442   RBC 5.65 07/16/2023 0442   HGB 14.1 07/16/2023 1357   HCT 40.3 07/16/2023 1357   PLT 181 07/16/2023 0442   MCV 76.3 (L) 07/16/2023 0442   MCH 26.4 07/16/2023 0442   MCHC 34.6 07/16/2023 0442   RDW 14.4 07/16/2023 0442   LYMPHSABS 3.2 07/03/2022 0540   MONOABS 0.7 07/03/2022 0540   EOSABS 0.8 (H) 07/03/2022 0540   BASOSABS 0.1 07/03/2022 0540   Recent Labs    07/14/23 1841 07/15/23 1110 07/16/23 0442 07/16/23 1357  HGB 15.4 16.0 14.9 14.1    CMP     Component Value Date/Time   NA 136 07/16/2023 0442   K 4.1 07/16/2023 0442   CL 101 07/16/2023 0442   CO2 22 07/16/2023 0442   GLUCOSE 173 (H) 07/16/2023 0442   BUN 35 (H) 07/16/2023 0442   CREATININE 1.40 (H) 07/16/2023 0442   CALCIUM 8.5 (L) 07/16/2023 0442   PROT 7.2 07/14/2023 1841   ALBUMIN 3.7 07/14/2023 1841   AST 23 07/14/2023 1841   ALT 22 07/14/2023 1841   ALKPHOS 51 07/14/2023 1841   BILITOT 0.9 07/14/2023 1841   GFRNONAA 56 (L) 07/16/2023 0442      Latest Ref Rng & Units 07/14/2023    6:41 PM 07/03/2022    5:40 AM 02/28/2022   12:54 PM  Hepatic Function  Total Protein 6.5 - 8.1 g/dL 7.2  7.2  7.4   Albumin 3.5 - 5.0 g/dL 3.7  3.8  3.7   AST 15 - 41 U/L 23  35  21   ALT 0 - 44 U/L 22  26  21    Alk Phosphatase 38 - 126 U/L 51  62  62   Total Bilirubin 0.3 - 1.2 mg/dL 0.9  0.8  0.8       Current Medications:    Current Outpatient Medications  (Cardiovascular):    atorvastatin (LIPITOR) 20 MG tablet, Take 20 mg by mouth daily.   losartan (COZAAR) 50 MG tablet, Take 50 mg by mouth daily.  Current Outpatient Medications (Respiratory):    albuterol (VENTOLIN HFA) 108 (90 Base) MCG/ACT inhaler, Inhale 1-2 puffs into the lungs every 6 (six) hours as needed for wheezing or shortness of breath.  Current Outpatient Medications (Analgesics):    acetaminophen (TYLENOL) 650 MG CR tablet, Take 1 tablet (650 mg total) by mouth every 8 (eight) hours as needed for pain. (Patient not taking: Reported on 02/07/2024)   aspirin 81 MG chewable tablet, Chew 1 tablet (81 mg total) by mouth daily. (Patient not taking: Reported on 02/07/2024)   Current Outpatient Medications (Other):    cholecalciferol (VITAMIN D3) 25 MCG (1000 UNIT) tablet,  Take 1,000 Units by mouth daily.   nicotine polacrilex (NICORETTE) 2 MG gum, Take 2 mg by mouth as needed for smoking cessation.   polyethylene glycol powder (GLYCOLAX) 17 GM/SCOOP powder, Take 17 g by mouth 2 (two) times daily.   cyclobenzaprine (FLEXERIL) 10 MG tablet, Take 1 tablet (10 mg total) by mouth 2 (two) times daily as needed for muscle spasms. (Patient not taking: Reported on 02/07/2024)   methocarbamol (ROBAXIN) 500 MG tablet, Take 500 mg by mouth every 8 (eight) hours as needed. (Patient not taking: Reported on 02/07/2024)   senna-docusate (SENOKOT-S) 8.6-50 MG tablet, Take 2 tablets by mouth 2 (two) times daily. (Patient not taking: Reported on 02/07/2024)  Medical History:  Past Medical History:  Diagnosis Date   Diabetes mellitus with neurological manifestation (HCC)    Dyslipidemia    Essential hypertension    Marijuana abuse 10/15/2020   Stroke (HCC)    Tobacco dependence 10/15/2020   Allergies: No Known Allergies   Surgical History:  He  has a past surgical history that includes Anterior cervical decomp/discectomy fusion (2024) and Colonoscopy. Family History:  His family history is not on  file.  REVIEW OF SYSTEMS  : All other systems reviewed and negative except where noted in the History of Present Illness.  PHYSICAL EXAM: BP 110/72   Pulse 73   Wt 174 lb 6.4 oz (79.1 kg) Comment: Per patient  BMI 25.02 kg/m  Physical Exam   GENERAL APPEARANCE: obese, chronically ill appearing, in no apparent distress. HEENT: No cervical lymphadenopathy, unremarkable thyroid, sclerae anicteric, conjunctiva pink. RESPIRATORY: Respiratory effort normal, wheezing present on the left side. CARDIO: RRR with no MRGs, peripheral pulses intact. ABDOMEN: Soft, non-distended, active bowel sounds in all 4 quadrants, no tenderness to palpation, no rebound, no mass appreciated, umbilical hernia reducible and non-tender, right side larger than left. RECTAL: Declines. MUSCULOSKELETAL: Full ROM, antalgic gait with walker, right leg dragging, without edema, right hip weakness present. SKIN: Dry, intact without rashes or lesions. No jaundice. NEURO: Alert, oriented, no focal deficits, sensation intact bilaterally. PSYCH: Cooperative, normal mood and affect.      Doree Albee, PA-C 3:06 PM

## 2024-02-19 ENCOUNTER — Telehealth: Payer: Self-pay

## 2024-02-19 DIAGNOSIS — K5900 Constipation, unspecified: Secondary | ICD-10-CM

## 2024-02-19 DIAGNOSIS — I69359 Hemiplegia and hemiparesis following cerebral infarction affecting unspecified side: Secondary | ICD-10-CM

## 2024-02-19 DIAGNOSIS — R3911 Hesitancy of micturition: Secondary | ICD-10-CM

## 2024-02-19 NOTE — Telephone Encounter (Signed)
-----   Message from Doree Albee sent at 02/19/2024  7:33 AM EDT ----- Regarding: FW: additional recommendation Can we please refer to physical and rehabilitation medicine for post-stroke care and suspected neurogenic bowel and bladder? Also let the patient know he can do enemas in addition to other bowel regimens? Thanks, Marchelle Folks ----- Message ----- From: Iva Boop, MD Sent: 02/18/2024   1:44 PM EDT To: Doree Albee, PA-C Subject: additional recommendation                      Marchelle Folks,  I have found it helpful for patients like this to see physical and rehabilitation medicine  Can you initiate a referral - post-stroke care and suspected neurogenic bowel and bladder would be reasons   Thanks  [facility]

## 2024-02-19 NOTE — Telephone Encounter (Signed)
 Left message on voicemail for patient to return call.   Ambulatory referral to Physical Medicine Rehab in Epic.  [facility] Physical Medicine & Rehabilitation [address] [place],  [place]  [address] Main: [phone]

## 2024-02-21 NOTE — Telephone Encounter (Signed)
 2nd attempt to reach patient - Left message on voicemail for patient to return call.

## 2024-02-23 NOTE — Telephone Encounter (Signed)
 3rd attempt to reach patient. Left patient a voicemail letting him know that this is our 3rd attempt to reach him to review additional recommendations. Will mail letter to patient with recommendations.

## 2024-07-22 ENCOUNTER — Encounter: Payer: Self-pay | Admitting: Podiatry

## 2024-07-22 ENCOUNTER — Ambulatory Visit (INDEPENDENT_AMBULATORY_CARE_PROVIDER_SITE_OTHER): Admitting: Podiatry

## 2024-07-22 DIAGNOSIS — E1142 Type 2 diabetes mellitus with diabetic polyneuropathy: Secondary | ICD-10-CM | POA: Diagnosis not present

## 2024-07-22 DIAGNOSIS — M79675 Pain in left toe(s): Secondary | ICD-10-CM | POA: Diagnosis not present

## 2024-07-22 DIAGNOSIS — M79674 Pain in right toe(s): Secondary | ICD-10-CM | POA: Diagnosis not present

## 2024-07-22 DIAGNOSIS — L853 Xerosis cutis: Secondary | ICD-10-CM | POA: Diagnosis not present

## 2024-07-22 DIAGNOSIS — B351 Tinea unguium: Secondary | ICD-10-CM | POA: Diagnosis not present

## 2024-07-22 MED ORDER — AMMONIUM LACTATE 12 % EX LOTN: 1.0000 | TOPICAL_LOTION | CUTANEOUS | 5 refills | Status: AC | PRN

## 2024-07-22 NOTE — Progress Notes (Unsigned)
    Subjective:  Patient ID: Calvin Jackson, male    DOB: [birth year],  MRN: [id number]  Calvin Jackson presents to clinic today for:  Chief Complaint  Patient presents with   Diabetes    Diabetic foot and nail care-Calvin Reed, DO with [facility] refer. No calluses  A1c 5.7. baby asprin    Patient notes nails are thick, discolored, elongated and painful in shoegear.  He is in a wheelchair since suffering a stroke.  He lives in a assisted care facility.  PCP is Jackson, Calvin L, DO.  Past Medical History:  Diagnosis Date   Diabetes mellitus with neurological manifestation (HCC)    Dyslipidemia    Essential hypertension    Marijuana abuse 10/15/2020   Stroke (HCC)    Tobacco dependence 10/15/2020   Past Surgical History:  Procedure Laterality Date   ANTERIOR CERVICAL DECOMP/DISCECTOMY FUSION  2024   COLONOSCOPY     Unknown date-[place]   No Known Allergies  Review of Systems: Negative except as noted in the HPI.  Objective:  Calvin Jackson is a pleasant 63 y.o. male in NAD. AAO x 3.  Vascular Examination: Capillary refill time is 3-5 seconds to toes bilateral. Palpable pedal pulses b/l LE. Digital hair sparse b/l.  Skin temperature gradient WNL b/l. No varicosities b/l. No cyanosis noted b/l.   Dermatological Examination: Pedal skin with decreased turgor, texture and tone b/l. No open wounds. No interdigital macerations b/l. Toenails x10 are 3mm thick, discolored, dystrophic with subungual debris. There is pain with compression of the nail plates.  They are elongated x10  Neurological examination: Protective sensation intact with Semmes Weinstein monofilament bilateral.  Vibratory sensation diminished to the forefoot bilateral  Assessment/Plan: 1. Pain due to onychomycosis of toenails of both feet   2. Type 2 diabetes mellitus with peripheral neuropathy (HCC)   3. Xerosis of skin     Meds ordered this encounter  Medications   ammonium lactate   (LAC-HYDRIN ) 12 % lotion    Sig: Apply 1 Application topically as needed for dry skin.    Dispense:  400 g    Refill:  5   The mycotic toenails were sharply debrided x10 with sterile nail nippers and a power debriding burr to decrease bulk/thickness and length.    Prescription for ammonium lactate  lotion sent in for the dry skin on his lower legs and feet.  He will apply once daily.  Return in about 3 months (around 10/22/2024) for [facility].   Calvin Jackson, DPM, FACFAS [facility]     [address] [place], [place] [address]                Office [phone]  Fax [phone]

## 2024-10-28 ENCOUNTER — Ambulatory Visit (INDEPENDENT_AMBULATORY_CARE_PROVIDER_SITE_OTHER): Admitting: Podiatry

## 2024-10-28 DIAGNOSIS — B351 Tinea unguium: Secondary | ICD-10-CM | POA: Diagnosis not present

## 2024-10-28 DIAGNOSIS — M79675 Pain in left toe(s): Secondary | ICD-10-CM | POA: Diagnosis not present

## 2024-10-28 DIAGNOSIS — M79674 Pain in right toe(s): Secondary | ICD-10-CM | POA: Diagnosis not present

## 2024-10-28 NOTE — Progress Notes (Unsigned)
    Subjective:  Patient ID: Calvin Jackson, male    DOB: 1961/08/30,  MRN: [id number]  Calvin Jackson presents to clinic today for:  Chief Complaint  Patient presents with   [facility]     RFC Non diabetic toenail trim. Dry skin bilateral plantar.   Patient notes nails are thick, discolored, elongated and painful in shoegear.  He is in a wheelchair since suffering a stroke.  He lives in a assisted care facility.  PCP is Reed, Tiffany L, DO.  Past Medical History:  Diagnosis Date   Diabetes mellitus with neurological manifestation (HCC)    Dyslipidemia    Essential hypertension    Marijuana abuse 10/15/2020   Stroke (HCC)    Tobacco dependence 10/15/2020   Past Surgical History:  Procedure Laterality Date   ANTERIOR CERVICAL DECOMP/DISCECTOMY FUSION  2024   COLONOSCOPY     Unknown date-[place]   No Known Allergies  Review of Systems: Negative except as noted in the HPI.  Objective:  Calvin Jackson is a pleasant 63 y.o. male in NAD. AAO x 3.  Vascular Examination: Capillary refill time is 3-5 seconds to toes bilateral. Palpable pedal pulses b/l LE. Digital hair sparse b/l.  Skin temperature gradient WNL b/l. No varicosities b/l. No cyanosis noted b/l.   Dermatological Examination: Pedal skin with decreased turgor, texture and tone b/l. No open wounds. No interdigital macerations b/l. Toenails x10 are 3mm thick, discolored, dystrophic with subungual debris. There is pain with compression of the nail plates.  They are elongated x10  Assessment/Plan: 1. Pain due to onychomycosis of toenails of both feet     The mycotic toenails were sharply debrided x10 with sterile nail nippers and a power debriding burr to decrease bulk/thickness and length.    Return in about 3 months (around 01/28/2025) for [facility].   Awanda CHARM Imperial, DPM, FACFAS [facility]     [address] [place], [place] [address]                Office [phone]  Fax [phone]

## 2024-10-28 NOTE — Patient Instructions (Signed)
.  tfc  

## 2024-11-25 ENCOUNTER — Encounter: Payer: Self-pay | Admitting: Vascular Surgery

## 2025-01-27 ENCOUNTER — Ambulatory Visit: Admitting: Podiatry
